# Patient Record
Sex: Female | Born: 1990 | Race: White | Hispanic: No | Marital: Single | Smoking: Current every day smoker
Health system: Southern US, Community
[De-identification: ages and names within clinical notes are randomized; demographics above are authoritative.]

## PROBLEM LIST (undated history)

## (undated) ENCOUNTER — Inpatient Hospital Stay (HOSPITAL_COMMUNITY): Payer: Self-pay

## (undated) DIAGNOSIS — B009 Herpesviral infection, unspecified: Secondary | ICD-10-CM

## (undated) DIAGNOSIS — J45909 Unspecified asthma, uncomplicated: Secondary | ICD-10-CM

## (undated) DIAGNOSIS — R51 Headache: Secondary | ICD-10-CM

## (undated) DIAGNOSIS — F32A Depression, unspecified: Secondary | ICD-10-CM

## (undated) DIAGNOSIS — M549 Dorsalgia, unspecified: Secondary | ICD-10-CM

## (undated) DIAGNOSIS — Z9889 Other specified postprocedural states: Secondary | ICD-10-CM

## (undated) DIAGNOSIS — F1910 Other psychoactive substance abuse, uncomplicated: Secondary | ICD-10-CM

## (undated) DIAGNOSIS — T8859XA Other complications of anesthesia, initial encounter: Secondary | ICD-10-CM

## (undated) DIAGNOSIS — F419 Anxiety disorder, unspecified: Secondary | ICD-10-CM

## (undated) DIAGNOSIS — T4145XA Adverse effect of unspecified anesthetic, initial encounter: Secondary | ICD-10-CM

## (undated) DIAGNOSIS — R112 Nausea with vomiting, unspecified: Secondary | ICD-10-CM

## (undated) DIAGNOSIS — F329 Major depressive disorder, single episode, unspecified: Secondary | ICD-10-CM

## (undated) DIAGNOSIS — K759 Inflammatory liver disease, unspecified: Secondary | ICD-10-CM

## (undated) HISTORY — PX: TONSILLECTOMY: SUR1361

## (undated) HISTORY — DX: Dorsalgia, unspecified: M54.9

---
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
## 2010-04-18 ENCOUNTER — Emergency Department (HOSPITAL_COMMUNITY)
Admission: EM | Admit: 2010-04-18 | Discharge: 2010-04-18 | Payer: Self-pay | Source: Home / Self Care | Admitting: Emergency Medicine

## 2010-09-03 LAB — URINALYSIS, ROUTINE W REFLEX MICROSCOPIC
Bilirubin Urine: NEGATIVE
Glucose, UA: NEGATIVE mg/dL
Ketones, ur: NEGATIVE mg/dL
Leukocytes, UA: NEGATIVE
Nitrite: NEGATIVE
Protein, ur: NEGATIVE mg/dL
Specific Gravity, Urine: 1.016 (ref 1.005–1.030)
Urobilinogen, UA: 0.2 mg/dL (ref 0.0–1.0)
pH: 7 (ref 5.0–8.0)

## 2010-09-03 LAB — URINE MICROSCOPIC-ADD ON

## 2010-09-03 LAB — CBC
Hemoglobin: 14.7 g/dL (ref 12.0–15.0)
Platelets: 216 10*3/uL (ref 150–400)
RBC: 4.53 MIL/uL (ref 3.87–5.11)
WBC: 9.7 10*3/uL (ref 4.0–10.5)

## 2010-09-03 LAB — POCT PREGNANCY, URINE: Preg Test, Ur: POSITIVE

## 2010-09-03 LAB — URINE CULTURE
Colony Count: NO GROWTH
Culture  Setup Time: 201110281900
Culture: NO GROWTH

## 2010-09-03 LAB — DIFFERENTIAL
Basophils Absolute: 0.1 10*3/uL (ref 0.0–0.1)
Basophils Relative: 1 % (ref 0–1)
Eosinophils Absolute: 0.3 10*3/uL (ref 0.0–0.7)
Eosinophils Relative: 3 % (ref 0–5)
Lymphocytes Relative: 24 % (ref 12–46)
Lymphs Abs: 2.3 10*3/uL (ref 0.7–4.0)
Monocytes Absolute: 0.6 10*3/uL (ref 0.1–1.0)
Monocytes Relative: 6 % (ref 3–12)
Neutro Abs: 6.4 10*3/uL (ref 1.7–7.7)
Neutrophils Relative %: 66 % (ref 43–77)

## 2010-09-03 LAB — WET PREP, GENITAL: Trich, Wet Prep: NONE SEEN

## 2010-09-03 LAB — GC/CHLAMYDIA PROBE AMP, GENITAL
Chlamydia, DNA Probe: NEGATIVE
GC Probe Amp, Genital: NEGATIVE

## 2010-09-03 LAB — ABO/RH: ABO/RH(D): O POS

## 2010-09-03 LAB — HCG, QUANTITATIVE, PREGNANCY: hCG, Beta Chain, Quant, S: 3809 m[IU]/mL — ABNORMAL HIGH (ref <5)

## 2010-09-29 LAB — WET PREP, GENITAL: Yeast Wet Prep HPF POC: NONE SEEN

## 2010-09-29 LAB — URINALYSIS, ROUTINE W REFLEX MICROSCOPIC
Bilirubin Urine: NEGATIVE
Ketones, ur: NEGATIVE mg/dL
Nitrite: NEGATIVE
Protein, ur: NEGATIVE mg/dL
pH: 6 (ref 5.0–8.0)

## 2010-09-29 LAB — GC/CHLAMYDIA PROBE AMP, GENITAL: Chlamydia, DNA Probe: NEGATIVE

## 2010-09-29 LAB — URINE MICROSCOPIC-ADD ON

## 2010-09-29 LAB — PREGNANCY, URINE: Preg Test, Ur: NEGATIVE

## 2010-09-29 LAB — RPR: RPR Ser Ql: NONREACTIVE

## 2010-11-03 LAB — ANTIBODY SCREEN: Antibody Screen: NEGATIVE

## 2010-11-03 LAB — HIV ANTIBODY (ROUTINE TESTING W REFLEX): HIV: NONREACTIVE

## 2010-11-03 LAB — ABO/RH: RH Type: POSITIVE

## 2010-11-03 LAB — GC/CHLAMYDIA PROBE AMP, GENITAL: Chlamydia: NEGATIVE

## 2010-11-03 LAB — CULTURE, BETA STREP (GROUP B ONLY): Organism ID, Bacteria: POSITIVE

## 2010-11-03 LAB — RPR: RPR: NONREACTIVE

## 2010-11-03 LAB — HEPATITIS B SURFACE ANTIGEN: Hepatitis B Surface Ag: NEGATIVE

## 2011-05-31 ENCOUNTER — Encounter (HOSPITAL_COMMUNITY): Payer: Self-pay

## 2011-05-31 ENCOUNTER — Inpatient Hospital Stay (HOSPITAL_COMMUNITY)
Admission: AD | Admit: 2011-05-31 | Discharge: 2011-06-02 | DRG: 766 | Disposition: A | Discharge status: Home / Self Care | Payer: Managed Care, Other (non HMO) | Source: Ambulatory Visit | Attending: Obstetrics and Gynecology | Admitting: Obstetrics and Gynecology

## 2011-05-31 ENCOUNTER — Encounter (HOSPITAL_COMMUNITY): Payer: Self-pay | Admitting: *Deleted

## 2011-05-31 ENCOUNTER — Inpatient Hospital Stay (HOSPITAL_COMMUNITY): Payer: Managed Care, Other (non HMO)

## 2011-05-31 ENCOUNTER — Other Ambulatory Visit: Payer: Self-pay | Admitting: Obstetrics and Gynecology

## 2011-05-31 ENCOUNTER — Encounter (HOSPITAL_COMMUNITY)
Admission: AD | Disposition: A | Discharge status: Home / Self Care | Payer: Self-pay | Source: Ambulatory Visit | Attending: Obstetrics and Gynecology

## 2011-05-31 DIAGNOSIS — A6000 Herpesviral infection of urogenital system, unspecified: Secondary | ICD-10-CM | POA: Diagnosis present

## 2011-05-31 DIAGNOSIS — O2643 Herpes gestationis, third trimester: Secondary | ICD-10-CM | POA: Diagnosis present

## 2011-05-31 DIAGNOSIS — Z22330 Carrier of Group B streptococcus: Secondary | ICD-10-CM

## 2011-05-31 DIAGNOSIS — O98519 Other viral diseases complicating pregnancy, unspecified trimester: Principal | ICD-10-CM | POA: Diagnosis present

## 2011-05-31 DIAGNOSIS — O99892 Other specified diseases and conditions complicating childbirth: Secondary | ICD-10-CM | POA: Diagnosis present

## 2011-05-31 DIAGNOSIS — IMO0002 Reserved for concepts with insufficient information to code with codable children: Secondary | ICD-10-CM

## 2011-05-31 DIAGNOSIS — Z98891 History of uterine scar from previous surgery: Secondary | ICD-10-CM

## 2011-05-31 DIAGNOSIS — A6009 Herpesviral infection of other urogenital tract: Secondary | ICD-10-CM

## 2011-05-31 HISTORY — DX: Anxiety disorder, unspecified: F41.9

## 2011-05-31 HISTORY — DX: Herpesviral infection, unspecified: B00.9

## 2011-05-31 HISTORY — DX: Headache: R51

## 2011-05-31 HISTORY — DX: Depression, unspecified: F32.A

## 2011-05-31 HISTORY — DX: Major depressive disorder, single episode, unspecified: F32.9

## 2011-05-31 LAB — WET PREP, GENITAL
Clue Cells Wet Prep HPF POC: NONE SEEN
Trich, Wet Prep: NONE SEEN

## 2011-05-31 LAB — CBC
Hemoglobin: 13.7 g/dL (ref 12.0–15.0)
RBC: 4.12 MIL/uL (ref 3.87–5.11)

## 2011-05-31 LAB — RPR: RPR Ser Ql: NONREACTIVE

## 2011-05-31 SURGERY — Surgical Case
Anesthesia: Regional | Site: Abdomen | Wound class: Clean Contaminated

## 2011-05-31 MED ORDER — IBUPROFEN 600 MG PO TABS
600.0000 mg | ORAL_TABLET | Freq: Four times a day (QID) | ORAL | Status: DC | PRN
  Filled 2011-05-31 (×4): qty 1

## 2011-05-31 MED ORDER — SENNOSIDES-DOCUSATE SODIUM 8.6-50 MG PO TABS
2.0000 | ORAL_TABLET | Freq: Every day | ORAL | Status: DC
  Administered 2011-05-31 – 2011-06-01 (×2): 2 via ORAL

## 2011-05-31 MED ORDER — BISACODYL 10 MG RE SUPP: 10.0000 mg | Freq: Every day | RECTAL | Status: DC | PRN

## 2011-05-31 MED ORDER — CITRIC ACID-SODIUM CITRATE 334-500 MG/5ML PO SOLN
ORAL | Status: AC
  Administered 2011-05-31: 30 mL via ORAL
  Filled 2011-05-31: qty 15

## 2011-05-31 MED ORDER — MENTHOL 3 MG MT LOZG: 1.0000 | LOZENGE | OROMUCOSAL | Status: DC | PRN

## 2011-05-31 MED ORDER — SCOPOLAMINE 1 MG/3DAYS TD PT72
MEDICATED_PATCH | TRANSDERMAL | Status: AC
  Filled 2011-05-31: qty 1

## 2011-05-31 MED ORDER — KETOROLAC TROMETHAMINE 60 MG/2ML IM SOLN
60.0000 mg | Freq: Once | INTRAMUSCULAR | Status: AC | PRN
  Administered 2011-05-31: 60 mg via INTRAMUSCULAR

## 2011-05-31 MED ORDER — FERROUS SULFATE 325 (65 FE) MG PO TABS
325.0000 mg | ORAL_TABLET | Freq: Two times a day (BID) | ORAL | Status: DC
  Administered 2011-06-01 – 2011-06-02 (×3): 325 mg via ORAL
  Filled 2011-05-31 (×3): qty 1

## 2011-05-31 MED ORDER — KETOROLAC TROMETHAMINE 30 MG/ML IJ SOLN: 30.0000 mg | Freq: Four times a day (QID) | INTRAMUSCULAR | Status: AC | PRN

## 2011-05-31 MED ORDER — OXYTOCIN 20 UNITS IN LACTATED RINGERS INFUSION - SIMPLE
INTRAVENOUS | Status: AC
  Filled 2011-05-31: qty 1000

## 2011-05-31 MED ORDER — FLEET ENEMA 7-19 GM/118ML RE ENEM: 1.0000 | ENEMA | Freq: Every day | RECTAL | Status: DC | PRN

## 2011-05-31 MED ORDER — DIBUCAINE 1 % RE OINT: 1.0000 "application " | TOPICAL_OINTMENT | RECTAL | Status: DC | PRN

## 2011-05-31 MED ORDER — MORPHINE SULFATE (PF) 0.5 MG/ML IJ SOLN
INTRAMUSCULAR | Status: DC | PRN
  Administered 2011-05-31 (×2): .5 mg via INTRAVENOUS
  Administered 2011-05-31: .15 mg via INTRATHECAL
  Administered 2011-05-31: .85 mg via INTRAVENOUS

## 2011-05-31 MED ORDER — ONDANSETRON HCL 4 MG/2ML IJ SOLN
INTRAMUSCULAR | Status: DC | PRN
  Administered 2011-05-31: 4 mg via INTRAVENOUS

## 2011-05-31 MED ORDER — WITCH HAZEL-GLYCERIN EX PADS: 1.0000 "application " | MEDICATED_PAD | CUTANEOUS | Status: DC | PRN

## 2011-05-31 MED ORDER — ONDANSETRON HCL 4 MG/2ML IJ SOLN: 4.0000 mg | INTRAMUSCULAR | Status: DC | PRN

## 2011-05-31 MED ORDER — LANOLIN HYDROUS EX OINT: 1.0000 "application " | TOPICAL_OINTMENT | CUTANEOUS | Status: DC | PRN

## 2011-05-31 MED ORDER — GLYCOPYRROLATE 0.2 MG/ML IJ SOLN
INTRAMUSCULAR | Status: DC | PRN
  Administered 2011-05-31: 0.1 mg via INTRAVENOUS

## 2011-05-31 MED ORDER — DIPHENHYDRAMINE HCL 25 MG PO CAPS: 25.0000 mg | ORAL_CAPSULE | Freq: Four times a day (QID) | ORAL | Status: DC | PRN

## 2011-05-31 MED ORDER — NALBUPHINE SYRINGE 5 MG/0.5 ML
5.0000 mg | INJECTION | INTRAMUSCULAR | Status: DC | PRN
  Filled 2011-05-31: qty 1

## 2011-05-31 MED ORDER — CITRIC ACID-SODIUM CITRATE 334-500 MG/5ML PO SOLN
30.0000 mL | Freq: Once | ORAL | Status: AC
  Administered 2011-05-31: 30 mL via ORAL

## 2011-05-31 MED ORDER — KETOROLAC TROMETHAMINE 60 MG/2ML IM SOLN
INTRAMUSCULAR | Status: AC
  Filled 2011-05-31: qty 2

## 2011-05-31 MED ORDER — OXYTOCIN 20 UNITS IN LACTATED RINGERS INFUSION - SIMPLE
125.0000 mL/h | INTRAVENOUS | Status: AC
  Administered 2011-05-31: 125 mL/h via INTRAVENOUS

## 2011-05-31 MED ORDER — IBUPROFEN 600 MG PO TABS
600.0000 mg | ORAL_TABLET | Freq: Four times a day (QID) | ORAL | Status: DC
  Administered 2011-05-31 – 2011-06-02 (×7): 600 mg via ORAL
  Filled 2011-05-31 (×3): qty 1

## 2011-05-31 MED ORDER — CEFAZOLIN SODIUM-DEXTROSE 1-4 GM-% IV SOLR
1.0000 g | Freq: Once | INTRAVENOUS | Status: AC
  Administered 2011-05-31: 1 g via INTRAVENOUS
  Filled 2011-05-31 (×2): qty 50

## 2011-05-31 MED ORDER — ONDANSETRON HCL 4 MG PO TABS: 4.0000 mg | ORAL_TABLET | ORAL | Status: DC | PRN

## 2011-05-31 MED ORDER — LACTATED RINGERS IV SOLN
INTRAVENOUS | Status: DC
  Administered 2011-05-31 (×3): via INTRAVENOUS

## 2011-05-31 MED ORDER — DIPHENHYDRAMINE HCL 50 MG/ML IJ SOLN: 25.0000 mg | INTRAMUSCULAR | Status: DC | PRN

## 2011-05-31 MED ORDER — FENTANYL CITRATE 0.05 MG/ML IJ SOLN
INTRAMUSCULAR | Status: DC | PRN
  Administered 2011-05-31: 25 ug via INTRATHECAL
  Administered 2011-05-31 (×3): 25 ug via INTRAVENOUS

## 2011-05-31 MED ORDER — ZOLPIDEM TARTRATE 5 MG PO TABS: 5.0000 mg | ORAL_TABLET | Freq: Every evening | ORAL | Status: DC | PRN

## 2011-05-31 MED ORDER — METOCLOPRAMIDE HCL 5 MG/ML IJ SOLN: 10.0000 mg | Freq: Three times a day (TID) | INTRAMUSCULAR | Status: DC | PRN

## 2011-05-31 MED ORDER — NALOXONE HCL 0.4 MG/ML IJ SOLN: 0.4000 mg | INTRAMUSCULAR | Status: DC | PRN

## 2011-05-31 MED ORDER — LACTATED RINGERS IV SOLN: INTRAVENOUS | Status: DC

## 2011-05-31 MED ORDER — OXYTOCIN 10 UNIT/ML IJ SOLN
INTRAMUSCULAR | Status: DC | PRN
  Administered 2011-05-31: 20 [IU] via INTRAMUSCULAR

## 2011-05-31 MED ORDER — ONDANSETRON HCL 4 MG/2ML IJ SOLN: 4.0000 mg | Freq: Three times a day (TID) | INTRAMUSCULAR | Status: DC | PRN

## 2011-05-31 MED ORDER — SODIUM CHLORIDE 0.9 % IJ SOLN: 3.0000 mL | INTRAMUSCULAR | Status: DC | PRN

## 2011-05-31 MED ORDER — PRENATAL PLUS 27-1 MG PO TABS
1.0000 | ORAL_TABLET | Freq: Every day | ORAL | Status: DC
  Administered 2011-06-01 – 2011-06-02 (×2): 1 via ORAL
  Filled 2011-05-31 (×2): qty 1

## 2011-05-31 MED ORDER — MEPERIDINE HCL 25 MG/ML IJ SOLN: 6.2500 mg | INTRAMUSCULAR | Status: DC | PRN

## 2011-05-31 MED ORDER — SODIUM CHLORIDE 0.9 % IV SOLN
1.0000 ug/kg/h | INTRAVENOUS | Status: DC | PRN
  Filled 2011-05-31: qty 2.5

## 2011-05-31 MED ORDER — SCOPOLAMINE 1 MG/3DAYS TD PT72
1.0000 | MEDICATED_PATCH | Freq: Once | TRANSDERMAL | Status: DC
  Administered 2011-05-31: 1.5 mg via TRANSDERMAL

## 2011-05-31 MED ORDER — OXYCODONE-ACETAMINOPHEN 5-325 MG PO TABS
1.0000 | ORAL_TABLET | ORAL | Status: DC | PRN
  Administered 2011-05-31 – 2011-06-01 (×7): 1 via ORAL
  Administered 2011-06-02 (×2): 2 via ORAL
  Filled 2011-05-31: qty 1
  Filled 2011-05-31 (×2): qty 2
  Filled 2011-05-31 (×6): qty 1

## 2011-05-31 MED ORDER — SIMETHICONE 80 MG PO CHEW: 80.0000 mg | CHEWABLE_TABLET | ORAL | Status: DC | PRN

## 2011-05-31 MED ORDER — HYDROMORPHONE HCL PF 1 MG/ML IJ SOLN: 0.2500 mg | INTRAMUSCULAR | Status: DC | PRN

## 2011-05-31 MED ORDER — VALACYCLOVIR HCL 500 MG PO TABS
500.0000 mg | ORAL_TABLET | Freq: Every day | ORAL | Status: DC
  Administered 2011-06-01 – 2011-06-02 (×2): 500 mg via ORAL
  Filled 2011-05-31 (×3): qty 1

## 2011-05-31 MED ORDER — SIMETHICONE 80 MG PO CHEW
80.0000 mg | CHEWABLE_TABLET | Freq: Three times a day (TID) | ORAL | Status: DC
  Administered 2011-05-31 – 2011-06-01 (×5): 80 mg via ORAL

## 2011-05-31 MED ORDER — DIPHENHYDRAMINE HCL 25 MG PO CAPS: 25.0000 mg | ORAL_CAPSULE | ORAL | Status: DC | PRN

## 2011-05-31 MED ORDER — DIPHENHYDRAMINE HCL 50 MG/ML IJ SOLN: 12.5000 mg | INTRAMUSCULAR | Status: DC | PRN

## 2011-05-31 MED ORDER — TETANUS-DIPHTH-ACELL PERTUSSIS 5-2.5-18.5 LF-MCG/0.5 IM SUSP: 0.5000 mL | Freq: Once | INTRAMUSCULAR | Status: DC

## 2011-05-31 MED ORDER — EPHEDRINE SULFATE 50 MG/ML IJ SOLN
INTRAMUSCULAR | Status: DC | PRN
  Administered 2011-05-31 (×2): 20 mg via INTRAVENOUS
  Administered 2011-05-31: 10 mg via INTRAVENOUS

## 2011-05-31 SURGICAL SUPPLY — 38 items
BENZOIN TINCTURE PRP APPL 2/3 (GAUZE/BANDAGES/DRESSINGS) ×2 IMPLANT
CLOSURE STERI STRIP 1/2 X4 (GAUZE/BANDAGES/DRESSINGS) ×2 IMPLANT
CLOTH BEACON ORANGE TIMEOUT ST (SAFETY) ×2 IMPLANT
CONTAINER PREFILL 10% NBF 15ML (MISCELLANEOUS) IMPLANT
DRAIN JACKSON PRT FLT 10 (DRAIN) IMPLANT
DRESSING TELFA 8X3 (GAUZE/BANDAGES/DRESSINGS) ×2 IMPLANT
DRSG COVADERM 4X10 (GAUZE/BANDAGES/DRESSINGS) ×2 IMPLANT
DRSG PAD ABDOMINAL 8X10 ST (GAUZE/BANDAGES/DRESSINGS) ×2 IMPLANT
ELECT REM PT RETURN 9FT ADLT (ELECTROSURGICAL) ×2
ELECTRODE REM PT RTRN 9FT ADLT (ELECTROSURGICAL) ×1 IMPLANT
EVACUATOR SILICONE 100CC (DRAIN) IMPLANT
EXTRACTOR VACUUM M CUP 4 TUBE (SUCTIONS) IMPLANT
GLOVE BIO SURGEON STRL SZ 6.5 (GLOVE) ×2 IMPLANT
GLOVE BIOGEL PI IND STRL 7.0 (GLOVE) ×3 IMPLANT
GLOVE BIOGEL PI INDICATOR 7.0 (GLOVE) ×3
GLOVE SKINSENSE NS SZ6.5 (GLOVE) ×2
GLOVE SKINSENSE STRL SZ6.5 (GLOVE) ×2 IMPLANT
GOWN PREVENTION PLUS LG XLONG (DISPOSABLE) ×6 IMPLANT
KIT ABG SYR 3ML LUER SLIP (SYRINGE) IMPLANT
NEEDLE HYPO 25X5/8 SAFETYGLIDE (NEEDLE) IMPLANT
NS IRRIG 1000ML POUR BTL (IV SOLUTION) ×2 IMPLANT
PACK C SECTION WH (CUSTOM PROCEDURE TRAY) ×2 IMPLANT
RTRCTR C-SECT PINK 25CM LRG (MISCELLANEOUS) IMPLANT
SLEEVE SCD COMPRESS KNEE MED (MISCELLANEOUS) ×2 IMPLANT
STAPLER VISISTAT 35W (STAPLE) IMPLANT
STRIP CLOSURE SKIN 1/2X4 (GAUZE/BANDAGES/DRESSINGS) ×2 IMPLANT
SUT CHROMIC 0 CT 1 (SUTURE) ×2 IMPLANT
SUT MNCRL AB 3-0 PS2 27 (SUTURE) ×2 IMPLANT
SUT PLAIN 2 0 (SUTURE)
SUT PLAIN 2 0 XLH (SUTURE) ×2 IMPLANT
SUT PLAIN ABS 2-0 CT1 27XMFL (SUTURE) IMPLANT
SUT SILK 2 0 SH (SUTURE) IMPLANT
SUT VIC AB 0 CTX 36 (SUTURE) ×4
SUT VIC AB 0 CTX36XBRD ANBCTRL (SUTURE) ×4 IMPLANT
TAPE CLOTH SURG 4X10 WHT LF (GAUZE/BANDAGES/DRESSINGS) ×2 IMPLANT
TOWEL OR 17X24 6PK STRL BLUE (TOWEL DISPOSABLE) ×4 IMPLANT
TRAY FOLEY CATH 14FR (SET/KITS/TRAYS/PACK) ×2 IMPLANT
WATER STERILE IRR 1000ML POUR (IV SOLUTION) ×2 IMPLANT

## 2011-05-31 NOTE — Anesthesia Preprocedure Evaluation (Signed)
Anesthesia Evaluation  Patient identified by MRN, date of birth, ID band Patient awake    Reviewed: Allergy & Precautions, H&P , Patient's Chart, lab work & pertinent test results  Airway Mallampati: II TM Distance: >3 FB Neck ROM: full    Dental No notable dental hx.    Pulmonary  clear to auscultation  Pulmonary exam normal       Cardiovascular Exercise Tolerance: Good regular Normal    Neuro/Psych    GI/Hepatic   Endo/Other    Renal/GU      Musculoskeletal   Abdominal   Peds  Hematology   Anesthesia Other Findings   Reproductive/Obstetrics                           Anesthesia Physical Anesthesia Plan  ASA: II  Anesthesia Plan: Spinal   Post-op Pain Management:    Induction:   Airway Management Planned:   Additional Equipment:   Intra-op Plan:   Post-operative Plan:   Informed Consent: I have reviewed the patients History and Physical, chart, labs and discussed the procedure including the risks, benefits and alternatives for the proposed anesthesia with the patient or authorized representative who has indicated his/her understanding and acceptance.   Dental Advisory Given  Plan Discussed with: CRNA  Anesthesia Plan Comments: (Lab work confirmed with CRNA in room. Platelets okay. Discussed spinal anesthetic, and patient consents to the procedure:  included risk of possible headache,backache, failed block, allergic reaction, and nerve injury. This patient was asked if she had any questions or concerns before the procedure started. )        Anesthesia Quick Evaluation  

## 2011-05-31 NOTE — Progress Notes (Signed)
Amy Pham   Subjective: Pt complain of pain secondary to contractions  Objective: BP 121/74  Pulse 91  Temp(Src) 98.4 F (36.9 C) (Oral)  Resp 18  Ht 5\' 7"  (1.702 m)  Wt 82.555 kg (182 lb)  BMI 28.51 kg/m2  SpO2 99%      FHT:  FHR: 130 bpm, variability: minimal ,  accelerations:  Present,  decelerations:  Absent UC:   regular, every 2-5 minutes SVE:   Dilation: 1.5 Effacement (%): 90 Station: -2 Exam by:: Conni Elliot CNM  Labs: Lab Results  Component Value Date   WBC 9.7 04/18/2010   HGB 14.1 11/03/2010   HCT 40 11/03/2010   MCV 92.7 04/18/2010   PLT 190 11/03/2010    Assessment / Plan: recent HSV outbreak with SROM  Labor: Progressing normally Fetal Wellbeing:  Category I Anticipated MOD:  pt offered cesarean secondary to recent HSV infection. risks  are bleeding, infection and damage to bowel bladder and major blood vessels.  Pt agreed to cesarean.  Beyla Loney A 05/31/2011, 8:17 AM

## 2011-05-31 NOTE — Consult Note (Signed)
Called to attend primary C/S for active HSV at 40 + weeks gestational age. Membranes intact by report. At delivery infant in vertex and was assisted vacuum extraction of head.  No dysmorphic features. Voided x 1 under radiant warmer. Cough/sneeze repeatedly with coarse breath sounds - suspect small mucus plug in trachea causing the cough/sneeze.  Given chest PT with improvement. BBO2 x 3 minutes. Shown to parents then carried to Transitional Nursery in warm blanket  Care to assigned pediatrician.    Dagoberto Ligas MD [facility]

## 2011-05-31 NOTE — ED Provider Notes (Deleted)
History     Chief Complaint  Patient presents with  . Contractions   HPI  Pt presents with c/o contractions with increased frequency and intensity since 0100 05/31/11.  Also has noted sm amt of pink spotting at times since approx 0300 05/31/11.  Also reports small amts of clear watery vaginal discharge since yesterday.  Denies fever, vag itching, burning or odor.  Reports her fetus is moving normally.  Pt reports she did have an HSV outbreak on her right labia last week which has now healed.   She reports she continues to smoke 1/2 ppd but denies current drug or ETOH use. Did not start Zoloft as rxed for depression/anxiety and states she is OK without med at present.   OB History    Grav Para Term Preterm Abortions TAB SAB Ect Mult Living   4    3 1 2    0      Past Medical History  Diagnosis Date  . HSV (herpes simplex virus) infection     not to be discussed in front of family per patient  . Anxiety   . Depression   . Headache     Past Surgical History  Procedure Date  . Tonsillectomy     2007    Family History  Problem Relation Age of Onset  . Diabetes Maternal Uncle   . Arthritis Maternal Grandmother   . Cancer Maternal Grandmother   . Heart disease Paternal Grandfather     History  Substance Use Topics  . Smoking status: Current Everyday Smoker -- 0.5 packs/day  . Smokeless tobacco: Not on file  . Alcohol Use: No     hx of drug use and rehab x2    Allergies: No Known Allergies  Prescriptions prior to admission  Medication Sig Dispense Refill  . prenatal vitamin w/FE, FA (PRENATAL 1 + 1) 27-1 MG TABS Take 1 tablet by mouth daily.        . valACYclovir (VALTREX) 500 MG tablet Take 500 mg by mouth daily.          Review of Systems  Constitutional: Negative.   HENT: Negative.   Eyes: Negative.   Respiratory: Negative.   Cardiovascular: Negative.   Gastrointestinal: Negative.   Genitourinary: Negative.   Musculoskeletal: Positive for back pain.  Skin:  Negative.   Neurological: Negative.   Endo/Heme/Allergies: Negative.   Psychiatric/Behavioral: Negative.    Physical Exam   Blood pressure 121/74, pulse 91, temperature 98.4 F (36.9 C), temperature source Oral, resp. rate 18, height 5\' 7"  (1.702 m), weight 82.555 kg (182 lb), SpO2 99.00%.  Physical Exam  Constitutional: She is oriented to person, place, and time. She appears well-developed and well-nourished.  HENT:  Head: Normocephalic and atraumatic.  Right Ear: External ear normal.  Left Ear: External ear normal.  Nose: Nose normal.  Eyes: Conjunctivae are normal. Pupils are equal, round, and reactive to light.  Neck: Normal range of motion. Neck supple. No thyromegaly present.  Cardiovascular: Normal rate, regular rhythm and intact distal pulses.   Respiratory: Effort normal and breath sounds normal.  GI: Soft. Bowel sounds are normal.       Gravid  Genitourinary: Vaginal discharge found.       Ext genitalia without open lesions noted but healing and sl redness noted at area where pt indicates she had HSV outbreak.  BUS neg.  Vag mod amt thin white d/c in vault with pos pool.  Neg CMT.    Musculoskeletal: Normal range of  motion.  Neurological: She is alert and oriented to person, place, and time.  Skin: Skin is warm and dry.  Psychiatric: She has a normal mood and affect. Her behavior is normal. Thought content normal.   FHR baseline 140 bpm.  Moderate variability present.  Accels present.  No decels noted. UCs every 5-6 minutes and mild to mod to palpation with 60-70sec duration.  Pt talking through UCs. SVE:  Dilation:  1-2 Effacement:  90% Station:  -2 Soft/posterior Exam by N. Katrinka Blazing, CNM  MAU Course  Procedures  Wet prep-neg Amnisure-positive Fern-positive  Assessment and Plan  IUP at 40.3wks Contractions Hx HSV with recent outbreak Pos GBS (urine in 1st trim) Tobacco use Prev hx MJ use Hx Depression/Anxiety.   Admit per consult Dr. Normand Sloop. Plan primary  C/S due to recent HSV.  Amy Pham O. 05/31/2011, 7:40 AM

## 2011-05-31 NOTE — Anesthesia Postprocedure Evaluation (Signed)
  Anesthesia Post-op Note  Patient: Amy Pham  Procedure(s) Performed:  CESAREAN SECTION - Primary cesarean section with delivery of baby boy at 60. apgars 8/9.   Patient is awake, responsive, moving her legs, and has signs of resolution of her numbness. Pain and nausea are reasonably well controlled. Vital signs are stable and clinically acceptable. Oxygen saturation is clinically acceptable. There are no apparent anesthetic complications at this time. Patient is ready for discharge.

## 2011-05-31 NOTE — Addendum Note (Signed)
Addendum  created 05/31/11 1403 by Quin Hoop Camaria Gerald   Modules edited:Notes Section

## 2011-05-31 NOTE — Progress Notes (Signed)
Patient states she has been having contractions and light pink spotting since 1am

## 2011-05-31 NOTE — ED Provider Notes (Signed)
History       Chief Complaint  Patient presents with  . Contractions   HPI Pt presents with c/o contractions with increased frequency and intensity since 0100 05/31/11.  Also has noted sm amt of pink spotting at times since approx 0300 05/31/11.  Also reports small amts of clear watery vaginal discharge since yesterday.  Denies fever, vag itching, burning or odor.  Reports her fetus is moving normally.  Pt reports she did have an HSV outbreak on her right labia last week which has now healed.   She reports she continues to smoke 1/2 ppd but denies current drug or ETOH use. Did not start Zoloft as rxed for depression/anxiety and states she is OK without med at present.   OB History    Grav Para Term Preterm Abortions TAB SAB Ect Mult Living   4    3 1 2    0      Past Medical History  Diagnosis Date  . HSV (herpes simplex virus) infection     not to be discussed in front of family per patient  . Anxiety   . Depression   . Headache     Past Surgical History  Procedure Date  . Tonsillectomy     2007    Family History  Problem Relation Age of Onset  . Diabetes Maternal Uncle   . Arthritis Maternal Grandmother   . Cancer Maternal Grandmother   . Heart disease Paternal Grandfather     History  Substance Use Topics  . Smoking status: Current Everyday Smoker -- 0.5 packs/day  . Smokeless tobacco: Not on file  . Alcohol Use: No     hx of drug use and rehab x2    Allergies: No Known Allergies  Prescriptions prior to admission  Medication Sig Dispense Refill  . prenatal vitamin w/FE, FA (PRENATAL 1 + 1) 27-1 MG TABS Take 1 tablet by mouth daily.          Review of Systems  Constitutional: Negative.   HENT: Negative.   Eyes: Negative.   Respiratory: Negative.   Cardiovascular: Negative.   Gastrointestinal: Negative.   Genitourinary: Negative.   Musculoskeletal: Positive for back pain.  Skin: Negative.   Neurological: Negative.   Endo/Heme/Allergies: Negative.     Psychiatric/Behavioral: Negative.    Physical Exam   Blood pressure 121/74, pulse 91, temperature 98.4 F (36.9 C), temperature source Oral, resp. rate 18, height 5\' 7"  (1.702 m), weight 82.555 kg (182 lb), SpO2 99.00%.  Physical Exam  Constitutional: She is oriented to person, place, and time. She appears well-developed and well-nourished.  HENT:  Head: Normocephalic and atraumatic.  Right Ear: External ear normal.  Left Ear: External ear normal.  Nose: Nose normal.  Eyes: Conjunctivae are normal. Pupils are equal, round, and reactive to light.  Neck: Normal range of motion. Neck supple. No thyromegaly present.  Cardiovascular: Normal rate, regular rhythm and intact distal pulses.   Respiratory: Effort normal and breath sounds normal.  GI: Soft. Bowel sounds are normal.       Gravid  Genitourinary: Vaginal discharge found.       Ext genitalia without open lesions noted but healing and sl redness noted at area where pt indicates she had HSV outbreak.  BUS neg.  Vag mod amt thin white d/c in vault with pos pool.  Neg CMT.    Musculoskeletal: Normal range of motion.  Neurological: She is alert and oriented to person, place, and time.  Skin: Skin is  warm and dry.  Psychiatric: She has a normal mood and affect. Her behavior is normal. Thought content normal.   FHR baseline 140 bpm.  Moderate variability present.  Accels present.  No decels noted. UCs every 5-6 minutes and mild to mod to palpation with 60-70sec duration.  Pt talking through UCs. SVE:  Dilation:  1-2 Effacement:  90% Station:  -2 Soft/posterior Exam by N. Amy Pham, CNM  MAU Course  Procedures Wet prep-neg Amnisure-positive Fern-positive  Assessment and Plan  IUP at 40.3wks Contractions Hx HSV with recent outbreak Pos GBS (urine in 1st trim) Tobacco use Prev hx MJ use Hx Depression/Anxiety.   Admit per consult Dr. Normand Pham. Plan primary C/S due to recent HSV.  Amy Pham O. 05/31/2011, 7:21 AM

## 2011-05-31 NOTE — Transfer of Care (Signed)
Immediate Anesthesia Transfer of Care Note  Patient: Amy Pham  Procedure(s) Performed:  CESAREAN SECTION - Primary cesarean section with delivery of baby boy at 58. apgars 8/9.  Patient Location: PACU  Anesthesia Type: Spinal  Level of Consciousness: awake, alert  and oriented  Airway & Oxygen Therapy: Patient Spontanous Breathing  Post-op Assessment: Report given to PACU RN and Post -op Vital signs reviewed and stable  Post vital signs: Reviewed and stable  Complications: No apparent anesthesia complications

## 2011-05-31 NOTE — ED Notes (Signed)
Called Mechele Collin MD Anesthesia received order for preoperative medication, notified NICU Dr. Oretha Milch about impending C/S and reason, notified house coverage.

## 2011-05-31 NOTE — Op Note (Signed)
Cesarean Section Procedure Note   PAVNEET MARKWOOD  05/31/2011  Indications: recent HSV infection   Pre-operative Diagnosis: ruptured/herpes simplex virus outbreak.   Post-operative Diagnosis: Same   Surgeon: Surgeon(s) and Role:    * Michael Litter, MD - Primary   Assistants: Dorene Grebe CNM  Anesthesia: spinal   Procedure Details:  The patient was seen in the Holding Room. The risks, benefits, complications, treatment options, and expected outcomes were discussed with the patient. The patient concurred with the proposed plan, giving informed consent. identified as Amy Pham and the procedure verified as C-Section Delivery. A Time Out was held and the above information confirmed.  After induction of anesthesia, the patient was draped and prepped in the usual sterile manner. A transverse incision was made and carried down through the subcutaneous tissue to the fascia. Fascial incision was made in the midline and extended transversely. The fascia was separated from the underlying rectus muscle superiorly and inferiorly. The peritoneum was identified and entered. Peritoneal incision was extended longitudinally with good visualization of bowel and bladder. The utero-vesical peritoneal reflection was incised transversely and the bladder flap was bluntly freed from the lower uterine segment. A low transverse uterine incision was made. Delivered from cephalic presentation was a  pound infant, with Apgar scores of 8 at one minute and 9 at five minutes. Cord ph was not sent the umbilical cord was clamped and cut cord blood was obtained for evaluation. The placenta was removed Intact and appeared normal. The uterine outline, tubes and ovaries appeared normal}. The uterine incision was closed with running locked sutures of 0Vicryl. A second layer 0 vicrlyl was used to imbricate the uterine incision    Hemostasis was observed. Lavage was carried out until clear. The fascia was then  reapproximated with running sutures ofo vicryl.  The superficial fascia was reaproximated with  0plain gut. The subcuticular closure of the skin was performed using 3-72monocryl     Instrument, sponge, and needle counts were correct prior the abdominal closure and were correct at the conclusion of the case.    Findings:female infant in vtx presentation with terminal meconium.     Estimated Blood Loss: 600cc  Total IV Fluids:   Urine Output: 100CC OF clear urine  Specimens: placenta to pathology  Complications: no complications  Disposition: PACU - hemodynamically stable.   Maternal Condition: stable   Baby condition / location:  nursery-stable  Attending Attestation: I was present and scrubbed for the entire procedure.   Signed: Surgeon(s): Michael Litter, MD

## 2011-05-31 NOTE — Anesthesia Postprocedure Evaluation (Signed)
  Anesthesia Post-op Note  Patient: Amy Pham  Procedure(s) Performed:  CESAREAN SECTION - Primary cesarean section with delivery of baby boy at 21. apgars 8/9.  Patient Location: Mother/Baby  Anesthesia Type: Spinal  Level of Consciousness: awake, alert  and oriented  Airway and Oxygen Therapy: Patient Spontanous Breathing  Post-op Pain: mild  Post-op Assessment: Patient's Cardiovascular Status Stable, Respiratory Function Stable, Patent Airway, No signs of Nausea or vomiting and Pain level controlled  Post-op Vital Signs: stable  Complications: No apparent anesthesia complications

## 2011-05-31 NOTE — Anesthesia Procedure Notes (Signed)

## 2011-05-31 NOTE — H&P (Signed)
Chief Complaint   Patient presents with   .  Contractions    HPI  Pt presents with c/o contractions with increased frequency and intensity since 0100 05/31/11. Also has noted sm amt of pink spotting at times since approx 0300 05/31/11. Also reports small amts of clear watery vaginal discharge since yesterday. Denies fever, vag itching, burning or odor. Reports her fetus is moving normally. Pt reports she did have an HSV outbreak on her right labia last week which has now healed. She reports she continues to smoke 1/2 ppd but denies current drug or ETOH use. Did not start Zoloft as rxed for depression/anxiety and states she is OK without med at present.  OB History    Grav  Para  Term  Preterm  Abortions  TAB  SAB  Ect  Mult  Living    4     3  1  2     0      Past Medical History   Diagnosis  Date   .  HSV (herpes simplex virus) infection      not to be discussed in front of family per patient   .  Anxiety    .  Depression    .  Headache     Past Surgical History   Procedure  Date   .  Tonsillectomy      2007    Family History   Problem  Relation  Age of Onset   .  Diabetes  Maternal Uncle    .  Arthritis  Maternal Grandmother    .  Cancer  Maternal Grandmother    .  Heart disease  Paternal Grandfather     History   Substance Use Topics   .  Smoking status:  Current Everyday Smoker -- 0.5 packs/day   .  Smokeless tobacco:  Not on file   .  Alcohol Use:  No      hx of drug use and rehab x2    Allergies: No Known Allergies  Prescriptions prior to admission   Medication  Sig  Dispense  Refill   .  prenatal vitamin w/FE, FA (PRENATAL 1 + 1) 27-1 MG TABS  Take 1 tablet by mouth daily.      Review of Systems  Constitutional: Negative.  HENT: Negative.  Eyes: Negative.  Respiratory: Negative.  Cardiovascular: Negative.  Gastrointestinal: Negative.  Genitourinary: Negative.  Musculoskeletal: Positive for back pain.  Skin: Negative.  Neurological: Negative.    Endo/Heme/Allergies: Negative.  Psychiatric/Behavioral: Negative.   Physical Exam   Blood pressure 121/74, pulse 91, temperature 98.4 F (36.9 C), temperature source Oral, resp. rate 18, height 5\' 7"  (1.702 m), weight 82.555 kg (182 lb), SpO2 99.00%.  Physical Exam  Constitutional: She is oriented to person, place, and time. She appears well-developed and well-nourished.  HENT:  Head: Normocephalic and atraumatic.  Right Ear: External ear normal.  Left Ear: External ear normal.  Nose: Nose normal.  Eyes: Conjunctivae are normal. Pupils are equal, round, and reactive to light.  Neck: Normal range of motion. Neck supple. No thyromegaly present.  Cardiovascular: Normal rate, regular rhythm and intact distal pulses.  Respiratory: Effort normal and breath sounds normal.  GI: Soft. Bowel sounds are normal.  Gravid  Genitourinary: Vaginal discharge found.  Ext genitalia without open lesions noted but healing and sl redness noted at area where pt indicates she had HSV outbreak. BUS neg. Vag mod amt thin white d/c in vault with pos pool. Neg CMT.  Musculoskeletal: Normal range of motion.  Neurological: She is alert and oriented to person, place, and time.  Skin: Skin is warm and dry.  Psychiatric: She has a normal mood and affect. Her behavior is normal. Thought content normal.  FHR baseline 140 bpm. Moderate variability present. Accels present. No decels noted.  UCs every 5-6 minutes and mild to mod to palpation with 60-70sec duration. Pt talking through UCs.  SVE: Dilation: 1-2  Effacement: 90%  Station: -2  Soft/posterior  Exam by N. Katrinka Blazing, CNM  MAU Course   Procedures  Wet prep-neg  Amnisure-positive  Fern-positive  Assessment and Plan   IUP at 40.3wks  Contractions  Hx HSV with recent outbreak  Pos GBS (urine in 1st trim)  Tobacco use  Prev hx MJ use  Hx Depression/Anxiety.  Admit per consult Dr. Normand Sloop.  Plan primary C/S due to recent HSV.  Raja Caputi O.  05/31/2011,  7:21 AM

## 2011-05-31 NOTE — OR Nursing (Signed)
Uterus massaged by S. Hannia Matchett RN.  Two tubes of cord blood to lab.  

## 2011-06-01 ENCOUNTER — Encounter (HOSPITAL_COMMUNITY): Payer: Self-pay | Admitting: *Deleted

## 2011-06-01 LAB — CBC
HCT: 33.6 % — ABNORMAL LOW (ref 36.0–46.0)
Platelets: 145 10*3/uL — ABNORMAL LOW (ref 150–400)
RDW: 14 % (ref 11.5–15.5)
WBC: 17.5 10*3/uL — ABNORMAL HIGH (ref 4.0–10.5)

## 2011-06-01 NOTE — Progress Notes (Signed)
Subjective: Postpartum Day 1: Cesarean Delivery due to HSV outbreak, in labor (confidential) Patient reports no problems voiding.  Foley removed at 1am, several voids since.  Up ad lib, no syncope or dizziness.  Bottlefeeding.   Objective: Vital signs in last 24 hours: Temp:  [97.7 F (36.5 C)-98.6 F (37 C)] 98.6 F (37 C) (12/10 0520) Pulse Rate:  [75-107] 86  (12/10 0520) Resp:  [16-34] 20  (12/10 0520) BP: (90-123)/(52-72) 123/64 mmHg (12/10 0520) SpO2:  [97 %-100 %] 97 % (12/10 0520)  Physical Exam:  General: alert Lochia: appropriate Uterine Fundus: firm Incision: healing well, dressing still in place, CDI DVT Evaluation: No evidence of DVT seen on physical exam. Negative Homan's sign.   Basename 06/01/11 0620 05/31/11 0811  HGB 11.3* 13.7  HCT 33.6* 39.7    Assessment/Plan: Status post Cesarean section. Doing well postoperatively.  Continue current care. Plan d/c home day 3 Reviewed contraceptive choices--patient undecided at present.  Amy Pham 06/01/2011, 9:45 AM

## 2011-06-02 DIAGNOSIS — O2643 Herpes gestationis, third trimester: Secondary | ICD-10-CM | POA: Diagnosis present

## 2011-06-02 DIAGNOSIS — Z98891 History of uterine scar from previous surgery: Secondary | ICD-10-CM

## 2011-06-02 MED ORDER — NORGESTIMATE-ETH ESTRADIOL 0.25-35 MG-MCG PO TABS: 1.0000 | ORAL_TABLET | Freq: Every day | ORAL | Status: DC

## 2011-06-02 MED ORDER — OXYCODONE-ACETAMINOPHEN 5-325 MG PO TABS: 1.0000 | ORAL_TABLET | ORAL | Status: AC | PRN

## 2011-06-02 MED ORDER — IBUPROFEN 600 MG PO TABS: 600.0000 mg | ORAL_TABLET | Freq: Four times a day (QID) | ORAL | Status: AC | PRN

## 2011-06-02 NOTE — Discharge Summary (Signed)
Obstetric Discharge Summary Reason for Admission: onset of labor, active HSV infection Prenatal Procedures: [place] Intrapartum Procedures: cesarean: low cervical, transverse Postpartum Procedures: none Complications-Operative and Postpartum: none Hemoglobin  Date Value Range Status  06/01/2011 11.3* 12.0-15.0 (g/dL) Final     DELTA CHECK NOTED     REPEATED TO VERIFY     HCT  Date Value Range Status  06/01/2011 33.6* 36.0-46.0 (%) Final   Hospital Course:  Patient was admitted on 05/31/11 in early labor, with evidence of current HSV outbreak.  She was taken to the operating room, where Dr. Normand Sloop performed a primary LTCS under spinal anesthesia, with delivery of a viable female, weight 7+4, apgars 8/9.  Infant was taken to the full-term nursery, mom was taken to recovery in good condition.  Patient planned to bottle feed.  On post-op day 1, patient was doing well, tolerating a regular diet, with Hgb of 11.3.  Throughout her stay, her physical exam was WNL, her incision was CDI, and her vital signs remained stable.  By post-op day 2, she was up ad lib, tolerating a regular diet, with good pain control with po med.  She desired early discharge and was discharged home in stable condition.  Contraceptive choice was oral contraceptives.    Discharge Diagnoses: Term Pregnancy-delivered, Active HSV infection, Primary LTCS  Discharge Information: Date: 06/02/2011 Activity: Per [facility] handout Diet: routine Medications: Ibuprofen, Percocet and Sprintec Condition: stable Instructions: refer to practice specific booklet Discharge to: home   Newborn Data: Live born female  Birth Weight: 7 lb 4.6 oz (3305 g) APGAR: 8, 9  Home with mother.  Amy Pham 06/02/2011, 10:50am

## 2011-06-02 NOTE — Progress Notes (Signed)
LATE ENTRY FROM 06/01/11:  PSYCHOSOCIAL ASSESSMENT ~ MATERNAL/CHILD Name: Amy Pham                                                                             Age: 20  Referral Date:       12 /10   / 12  Reason/Source: History of depression and MJ use / CN  I. FAMILY/HOME ENVIRONMENT A. Child's Legal Guardian _X__Parent(s) ___Grandparent ___Foster parent ___DSS_________________ Name: Amy Pham                         DOB: //                     Age: 21  Address: [address], [place] [address]  Name:    Amy Pham                                 DOB: //                     Age: 54  Address:  B. Other Household Members/Support Persons Name: Amy Pham                  Relationship: parents                       DOB ___/___/___                   Name:                                         Relationship:                        DOB ___/___/___                   Name:                                         Relationship:                        DOB ___/___/___                   Name:                                         Relationship:                        DOB ___/___/___  C. Other Support:   II. PSYCHOSOCIAL DATA A. Information Source  _X_Patient Interview  __Family Interview           __Other___________  B. Surveyor, quantity and [employer] __Employment: _X_Medicaid    [place]                 _X_Private Insurance: [employer]                   __Self Pay  _X_Food Stamps   _X_WIC __[employer]* will apply     __Public Housing     __Section 8    __Maternity Care Coordination/Child Service Coordination/Early Intervention   ___School:                                                                         Grade:  __Other:   Salena Saner Cultural and Environment Information Cultural Issues Impacting Care:  III. STRENGTHS _X__Supportive family/friends _X__Adequate  Resources ___Compliance with medical plan _X__Home prepared for Child (including basic supplies) ___Understanding of illness      ___Other: RISK FACTORS AND CURRENT PROBLEMS         ____No Problems Noted         History of depression/anxiety  History of MJ                                                                                                                                                                                                                                            IV. SOCIAL WORK ASSESSMENT  Sw met with pt to assess history of depression/anxiety and MJ use.  Pt told Sw that she was diagnosed with depression at age 57 and has dealt with symptoms "on and off" to present.  She has not taken any medication recently and reports that she coped well during pregnancy.  She denies any SI or HI history.  She admits to smoking MJ during pregnancy around "5th or 6th month" to help increase her appetite or at bedtime.  She last time she smoked was 2-3 months ago.  Pt also has a history of opiate abuse and received in patient treatment for 21 days at facility in [facility].  She  denies other illegal substance use.  She reports having all the supplies she needs for the infant.  Sw will follow up with drug screen and make a referral if needed.    V. SOCIAL WORK PLAN  _X__No Further Intervention Required/No Barriers to Discharge   ___Psychosocial Support and Ongoing Assessment of Needs   ___Patient/Family Education:   ___Child Protective Services Report   County___________ Date___/____/____   ___Information/Referral to [employer]_________________________   ___Other:

## 2011-06-02 NOTE — Progress Notes (Addendum)
Subjective: Postpartum Day 2; Cesarean Delivery for HSV outbreak Patient reports no problems voiding.  Doing well.  Interested in d/c today if baby can go.  Wants OCPs, bottlefeeding. Baby hasn't been seen by peds yet--having some spitting.  Objective: Vital signs in last 24 hours: Temp:  [97.6 F (36.4 C)-98.7 F (37.1 C)] 98.7 F (37.1 C) (12/11 0551) Pulse Rate:  [83-97] 96  (12/11 0551) Resp:  [18-20] 20  (12/11 0551) BP: (90-112)/(55-73) 104/70 mmHg (12/11 0551) SpO2:  [97 %] 97 % (12/10 0935)  Physical Exam:  General: alert Lochia: appropriate Uterine Fundus: firm Incision: healing well DVT Evaluation: No evidence of DVT seen on physical exam. Negative Homan's sign.   Basename 06/01/11 0620 05/31/11 0811  HGB 11.3* 13.7  HCT 33.6* 39.7    Assessment/Plan: Status post Cesarean section. Doing well postoperatively.  Continue current care. Will await peds decision regarding baby.  Nigel Bridgeman 06/02/2011, 7:41 AM  Addendum: Pecola Leisure OK'd for d/c--will d/c mom today.  Nigel Bridgeman, CNM, [place] 06/02/11 10:50am

## 2011-08-11 ENCOUNTER — Other Ambulatory Visit: Payer: Self-pay | Admitting: Obstetrics and Gynecology

## 2011-08-11 DIAGNOSIS — K8020 Calculus of gallbladder without cholecystitis without obstruction: Secondary | ICD-10-CM

## 2011-08-12 ENCOUNTER — Ambulatory Visit (HOSPITAL_COMMUNITY)
Admission: RE | Admit: 2011-08-12 | Discharge: 2011-08-12 | Disposition: A | Discharge status: Home / Self Care | Payer: Managed Care, Other (non HMO) | Source: Ambulatory Visit | Attending: Obstetrics and Gynecology | Admitting: Obstetrics and Gynecology

## 2011-08-12 ENCOUNTER — Other Ambulatory Visit: Payer: Self-pay | Admitting: Obstetrics and Gynecology

## 2011-08-12 DIAGNOSIS — K8020 Calculus of gallbladder without cholecystitis without obstruction: Secondary | ICD-10-CM

## 2011-08-12 DIAGNOSIS — R1011 Right upper quadrant pain: Secondary | ICD-10-CM | POA: Insufficient documentation

## 2011-08-17 ENCOUNTER — Other Ambulatory Visit: Payer: Self-pay | Admitting: Family Medicine

## 2011-08-17 DIAGNOSIS — M549 Dorsalgia, unspecified: Secondary | ICD-10-CM

## 2011-08-19 ENCOUNTER — Ambulatory Visit (HOSPITAL_COMMUNITY): Admission: RE | Admit: 2011-08-19 | Payer: Managed Care, Other (non HMO) | Source: Ambulatory Visit

## 2012-09-02 ENCOUNTER — Emergency Department (HOSPITAL_COMMUNITY)
Admission: EM | Admit: 2012-09-02 | Discharge: 2012-09-02 | Disposition: A | Discharge status: Home / Self Care | Payer: Managed Care, Other (non HMO) | Attending: Emergency Medicine | Admitting: Emergency Medicine

## 2012-09-02 ENCOUNTER — Encounter (HOSPITAL_COMMUNITY): Payer: Self-pay | Admitting: *Deleted

## 2012-09-02 DIAGNOSIS — Z8619 Personal history of other infectious and parasitic diseases: Secondary | ICD-10-CM | POA: Insufficient documentation

## 2012-09-02 DIAGNOSIS — N949 Unspecified condition associated with female genital organs and menstrual cycle: Secondary | ICD-10-CM | POA: Insufficient documentation

## 2012-09-02 DIAGNOSIS — N938 Other specified abnormal uterine and vaginal bleeding: Secondary | ICD-10-CM | POA: Insufficient documentation

## 2012-09-02 DIAGNOSIS — R102 Pelvic and perineal pain: Secondary | ICD-10-CM

## 2012-09-02 DIAGNOSIS — M545 Low back pain, unspecified: Secondary | ICD-10-CM | POA: Insufficient documentation

## 2012-09-02 DIAGNOSIS — M542 Cervicalgia: Secondary | ICD-10-CM | POA: Insufficient documentation

## 2012-09-02 DIAGNOSIS — R0602 Shortness of breath: Secondary | ICD-10-CM | POA: Insufficient documentation

## 2012-09-02 DIAGNOSIS — M533 Sacrococcygeal disorders, not elsewhere classified: Secondary | ICD-10-CM | POA: Insufficient documentation

## 2012-09-02 DIAGNOSIS — A599 Trichomoniasis, unspecified: Secondary | ICD-10-CM | POA: Insufficient documentation

## 2012-09-02 DIAGNOSIS — F17200 Nicotine dependence, unspecified, uncomplicated: Secondary | ICD-10-CM | POA: Insufficient documentation

## 2012-09-02 DIAGNOSIS — Z8659 Personal history of other mental and behavioral disorders: Secondary | ICD-10-CM | POA: Insufficient documentation

## 2012-09-02 DIAGNOSIS — G8929 Other chronic pain: Secondary | ICD-10-CM | POA: Insufficient documentation

## 2012-09-02 DIAGNOSIS — N898 Other specified noninflammatory disorders of vagina: Secondary | ICD-10-CM | POA: Insufficient documentation

## 2012-09-02 DIAGNOSIS — Z3202 Encounter for pregnancy test, result negative: Secondary | ICD-10-CM | POA: Insufficient documentation

## 2012-09-02 LAB — BASIC METABOLIC PANEL
BUN: 11 mg/dL (ref 6–23)
Chloride: 100 mEq/L (ref 96–112)
GFR calc Af Amer: >90 mL/min (ref >90)
Glucose, Bld: 91 mg/dL (ref 70–99)
Potassium: 3.8 mEq/L (ref 3.5–5.1)

## 2012-09-02 LAB — CBC WITH DIFFERENTIAL/PLATELET
Hemoglobin: 11.2 g/dL — ABNORMAL LOW (ref 12.0–15.0)
Lymphs Abs: 2.7 10*3/uL (ref 0.7–4.0)
Monocytes Relative: 3 % (ref 3–12)
Neutro Abs: 3.9 10*3/uL (ref 1.7–7.7)
Neutrophils Relative %: 55 % (ref 43–77)
RBC: 3.65 MIL/uL — ABNORMAL LOW (ref 3.87–5.11)
WBC: 7 10*3/uL (ref 4.0–10.5)

## 2012-09-02 LAB — URINALYSIS, ROUTINE W REFLEX MICROSCOPIC
Bilirubin Urine: NEGATIVE
Hgb urine dipstick: NEGATIVE
Nitrite: NEGATIVE
Specific Gravity, Urine: 1.02 (ref 1.005–1.030)
pH: 7 (ref 5.0–8.0)

## 2012-09-02 LAB — WET PREP, GENITAL: Yeast Wet Prep HPF POC: NONE SEEN

## 2012-09-02 LAB — PREGNANCY, URINE: Preg Test, Ur: NEGATIVE

## 2012-09-02 MED ORDER — FENTANYL CITRATE 0.05 MG/ML IJ SOLN
100.0000 ug | Freq: Once | INTRAMUSCULAR | Status: AC
  Administered 2012-09-02: 100 ug via INTRAVENOUS
  Filled 2012-09-02: qty 2

## 2012-09-02 MED ORDER — OXYCODONE-ACETAMINOPHEN 5-325 MG PO TABS: 2.0000 | ORAL_TABLET | ORAL | Status: DC | PRN

## 2012-09-02 MED ORDER — OXYCODONE-ACETAMINOPHEN 5-325 MG PO TABS: 2.0000 | ORAL_TABLET | Freq: Four times a day (QID) | ORAL | Status: DC | PRN

## 2012-09-02 MED ORDER — ONDANSETRON 8 MG PO TBDP
8.0000 mg | ORAL_TABLET | Freq: Once | ORAL | Status: AC
  Administered 2012-09-02: 8 mg via ORAL
  Filled 2012-09-02: qty 1

## 2012-09-02 MED ORDER — DOXYCYCLINE HYCLATE 100 MG PO CAPS: 100.0000 mg | ORAL_CAPSULE | Freq: Two times a day (BID) | ORAL | Status: DC

## 2012-09-02 MED ORDER — CEFTRIAXONE SODIUM 250 MG IJ SOLR
250.0000 mg | Freq: Once | INTRAMUSCULAR | Status: AC
  Administered 2012-09-02: 250 mg via INTRAMUSCULAR
  Filled 2012-09-02: qty 250

## 2012-09-02 MED ORDER — METRONIDAZOLE 500 MG PO TABS: 500.0000 mg | ORAL_TABLET | Freq: Two times a day (BID) | ORAL | Status: DC

## 2012-09-02 MED ORDER — METRONIDAZOLE 500 MG PO TABS
2000.0000 mg | ORAL_TABLET | Freq: Once | ORAL | Status: AC
  Administered 2012-09-02: 2000 mg via ORAL
  Filled 2012-09-02: qty 4

## 2012-09-02 MED ORDER — LIDOCAINE HCL (PF) 1 % IJ SOLN
INTRAMUSCULAR | Status: AC
  Administered 2012-09-02: 2.1 mL
  Filled 2012-09-02: qty 5

## 2012-09-02 MED ORDER — AZITHROMYCIN 1 G PO PACK
1.0000 g | PACK | Freq: Once | ORAL | Status: AC
  Administered 2012-09-02: 1 g via ORAL
  Filled 2012-09-02: qty 1

## 2012-09-02 NOTE — ED Provider Notes (Signed)
History  This chart was scribed for Hurman Horn, MD by Erskine Emery, ED Scribe. This patient was seen in room APAH2/APAH2 and the patient's care was started at 19:23.   CSN: [id number]  Arrival date & time 09/02/12  1520   First MD Initiated Contact with Patient 09/02/12 1923      Chief Complaint  Patient presents with  . Abdominal Pain  . Tailbone Pain    (Consider location/radiation/quality/duration/timing/severity/associated sxs/prior treatment) The history is provided by the patient. No language interpreter was used.  Amy Pham is a 22 y.o. female who presents to the Emergency Department complaining of constant moderate LLQ pain for the past 3 days. Pt called her PCP who told her it might be a cyst and that she should come to the ED for evaluation. Pt reports some associated SOB, vaginal bleeding (onset in the ED), and vaginal discharge yesterday. She reports the pain is aggravated by movement. Pt's LNMP was 2 weeks ago and she is usually very regular, about every 4 weeks. Pt reports she took a negative home pregnancy test yesterday. She denies any associated emesis, nausea, diarrhea, or any previous episodes of simlar symptoms. Pt also presents with neck pain and chronic burning lower back pain for the past year that intermittently radiates down the right leg without weak, numb, or change bowel/bladder function. Pt had a MRI ordered but she has not been able to get a ride to the facility to have it done.No sudden onset and no colicky pain. LBP is stable and unchanged in months.  Past Medical History  Diagnosis Date  . HSV (herpes simplex virus) infection     not to be discussed in front of family per patient  . Anxiety   . Depression   . Headache     Past Surgical History  Procedure Laterality Date  . Tonsillectomy      2007  . Cesarean section  05/31/2011    Procedure: CESAREAN SECTION;  Surgeon: Michael Litter, MD;  Location: [facility];  Service: Gynecology;   Laterality: N/A;  Primary cesarean section with delivery of baby boy at 53. apgars 8/9.    Family History  Problem Relation Age of Onset  . Diabetes Maternal Uncle   . Arthritis Maternal Grandmother   . Cancer Maternal Grandmother   . Heart disease Paternal Grandfather     History  Substance Use Topics  . Smoking status: Current Every Day Smoker -- 0.50 packs/day    Types: Cigarettes  . Smokeless tobacco: Not on file  . Alcohol Use: No     Comment: hx of drug use and rehab x2    OB History   Grav Para Term Preterm Abortions TAB SAB Ect Mult Living   4 1 1  3 1 2   1       Review of Systems 10 Systems reviewed and are negative for acute change except as noted in the HPI.   Allergies  Review of patient's allergies indicates no known allergies.  Home Medications   Current Outpatient Rx  Name  Route  Sig  Dispense  Refill  . doxycycline (VIBRAMYCIN) 100 MG capsule   Oral   Take 1 capsule (100 mg total) by mouth 2 (two) times daily. One po bid x 7 days   14 capsule   0   . metroNIDAZOLE (FLAGYL) 500 MG tablet   Oral   Take 1 tablet (500 mg total) by mouth 2 (two) times daily. One po bid x  7 days   14 tablet   0   . oxyCODONE-acetaminophen (PERCOCET) 5-325 MG per tablet   Oral   Take 2 tablets by mouth every 6 (six) hours as needed for pain.   10 tablet   0   . oxyCODONE-acetaminophen (PERCOCET) 5-325 MG per tablet   Oral   Take 2 tablets by mouth every 4 (four) hours as needed for pain.   6 tablet   0     Triage Vitals: BP 113/66  Pulse 96  Temp(Src) 98.7 F (37.1 C) (Oral)  Resp 18  SpO2 100%  LMP 07/22/2012  Physical Exam  Nursing note and vitals reviewed. Constitutional:  Awake, alert, nontoxic appearance.  HENT:  Head: Atraumatic.  Eyes: Pupils are equal, round, and reactive to light. Right eye exhibits no discharge. Left eye exhibits no discharge.  Neck: Neck supple.  No midline tenderness. Posterior paracervical tenderness.   Cardiovascular: Normal rate and regular rhythm.   No murmur heard. Pulmonary/Chest: Effort normal and breath sounds normal. No respiratory distress. She has no wheezes. She has no rales. She exhibits no tenderness.  Abdominal: Soft. Bowel sounds are normal. She exhibits no mass. There is tenderness. There is no rebound.  Moderately tender to LLQ. Rest of the abdomen is nontender.  Genitourinary:  Upon Speculum exam: small amount of vaginal bleeding. Cevix closed. No discharge. Bimanual exam: no cervical motion temderness, no right adnexal tenderness. Positive left adnexal tenderness without palpable mass.  Musculoskeletal: She exhibits no tenderness.       Thoracic back: She exhibits no tenderness.       Lumbar back: She exhibits no tenderness.  Bilateral lower extremities non tender without new rashes or color change, baseline ROM with intact DP pulses, CR<2 secs all digits bilaterally, DTR's symmetric and intact bilaterally KJ / AJ, motor symmetric bilateral 5 / 5 hip flexion, quadriceps, hamstrings, EHL, foot dorsiflexion, foot plantarflexion. No obvious new focal weakness. Diffuse lumbar and paralumbar tenderness. Baseline numbness to both great toes with the remainder of the feet normal light touch.  Neurological:  Mental status and motor strength appears baseline for patient and situation.  Skin: No rash noted.  Psychiatric: She has a normal mood and affect.    ED Course  Procedures (including critical care time) DIAGNOSTIC STUDIES: Oxygen Saturation is 100% on room air, normal by my interpretation.    COORDINATION OF CARE: 19:25--Patient / Family / Caregiver informed of clinical course, understand medical decision-making process, and agree with plan. Treatment plan includes: urinalysis, pregnancy test, blood work, and pelvic exam.  21:19--I performed a pelvic exam with chaperone present. Pt reports she has tried birth control, ibuprofen, and goodies with no relief from pain. I  explained to the pt that her symptoms do not suggest severe PID, but since positive for trich will cover for STDs and questionable mild PID with meds in ED and for discharge.     Labs Reviewed  WET PREP, GENITAL - Abnormal; Notable for the following:    Trich, Wet Prep FEW (*)    Clue Cells Wet Prep HPF POC FEW (*)    WBC, Wet Prep HPF POC FEW (*)    All other components within normal limits  CBC WITH DIFFERENTIAL - Abnormal; Notable for the following:    RBC 3.65 (*)    Hemoglobin 11.2 (*)    HCT 31.1 (*)    All other components within normal limits  GC/CHLAMYDIA PROBE AMP  BASIC METABOLIC PANEL  URINALYSIS, ROUTINE W REFLEX  MICROSCOPIC  PREGNANCY, URINE   No results found.   1. Pelvic pain   2. Chronic low back pain   3. Trichomonas       MDM  I doubt any other EMC precluding discharge at this time including, but not necessarily limited to the following:sepsis, cauda equina, ovarian torsion.  I personally performed the services described in this documentation, which was scribed in my presence. The recorded information has been reviewed and is accurate.     Hurman Horn, MD 09/03/12 [id number]

## 2012-09-02 NOTE — ED Notes (Signed)
Pt states pain to LLQ, PMD told her it may be a cyst. Pain x 3 days. PMD told pt she may need to come here to have a CT scan done, per pt. Pt also states, while I'm here I want to get my back checked too. Lower back pain x 1 year.

## 2012-09-05 LAB — GC/CHLAMYDIA PROBE AMP: CT Probe RNA: POSITIVE — AB

## 2012-09-07 ENCOUNTER — Telehealth (HOSPITAL_COMMUNITY): Payer: Self-pay | Admitting: Emergency Medicine

## 2012-09-07 ENCOUNTER — Ambulatory Visit (INDEPENDENT_AMBULATORY_CARE_PROVIDER_SITE_OTHER): Payer: Managed Care, Other (non HMO) | Admitting: Nurse Practitioner

## 2012-09-07 VITALS — BP 112/62 | HR 80 | Temp 97.4°F | Wt 136.0 lb

## 2012-09-07 DIAGNOSIS — R1032 Left lower quadrant pain: Secondary | ICD-10-CM

## 2012-09-07 LAB — POCT UA - MICROSCOPIC ONLY
Mucus, UA: NEGATIVE
Yeast, UA: NEGATIVE

## 2012-09-07 LAB — POCT URINALYSIS DIPSTICK

## 2012-09-07 MED ORDER — TRAMADOL HCL 50 MG PO TABS: 50.0000 mg | ORAL_TABLET | Freq: Four times a day (QID) | ORAL | Status: DC | PRN

## 2012-09-07 MED ORDER — IBUPROFEN 600 MG PO TABS: 600.0000 mg | ORAL_TABLET | Freq: Four times a day (QID) | ORAL | Status: DC | PRN

## 2012-09-07 NOTE — Progress Notes (Signed)
  Subjective:    Patient ID: Amy Pham, female    DOB: 02/20/1991, 22 y.o.   MRN: [id number]  Abdominal Pain This is a new problem. The current episode started in the past 7 days. The onset quality is sudden. The problem occurs constantly. The problem has been gradually worsening. The pain is located in the LLQ. The pain is at a severity of 9/10. The pain is severe. The quality of the pain is burning and sharp. The abdominal pain radiates to the RLQ. Associated symptoms include dysuria. Pertinent negatives include no constipation, diarrhea, fever, nausea or vomiting. Nothing aggravates the pain. The pain is relieved by nothing. She has tried nothing for the symptoms. The treatment provided no relief.  Urinary Tract Infection  This is a new problem. The current episode started in the past 7 days. The problem occurs intermittently. The problem has been gradually worsening. The quality of the pain is described as burning. The pain is at a severity of 9/10. The pain is severe. There has been no fever. She is sexually active. There is no history of pyelonephritis. Associated symptoms include flank pain. Pertinent negatives include no nausea or vomiting. She has tried nothing for the symptoms.   Patient went to ER Friday. Checked for STD was given tested positive for G/c and trich. Given 1g of azithrymycin, shot of rocephin, and  Flagyl 500 bid. Pelvic exam was negative.   Review of Systems  Constitutional: Negative.  Negative for fever.  Eyes: Negative.   Respiratory: Negative.   Gastrointestinal: Positive for abdominal pain. Negative for nausea, vomiting, diarrhea and constipation.  Genitourinary: Positive for dysuria and flank pain.       Objective:   Physical Exam  Constitutional: She is oriented to person, place, and time. She appears well-developed and well-nourished.  Cardiovascular: Normal rate, regular rhythm and normal heart sounds.   Pulmonary/Chest: Effort normal and breath  sounds normal.  Abdominal: Soft. Normal appearance and bowel sounds are normal. She exhibits no mass. There is no rebound, no guarding and no CVA tenderness.  Neurological: She is alert and oriented to person, place, and time.  Skin: Skin is warm and dry.  Psychiatric: She has a normal mood and affect. Her behavior is normal. Judgment and thought content normal.  BP 112/62  Pulse 80  Temp(Src) 97.4 F (36.3 C) (Oral)  Wt 136 lb (61.689 kg)  BMI 21.3 kg/m2  LMP 09/03/2012  Breastfeeding? No         Assessment & Plan:  Pelvic pain  Motrin as RX  Warm compresses to abdomen  Referral to GYN  Finish current antibiotic  Mary-Margaret Daphine Deutscher, FNP

## 2012-09-07 NOTE — ED Notes (Signed)
Results received from [facility].  (+) Chlamydia. Treated with Zithromax and Rocephin.  [facility] form completed and faxed.  Call and notify patient.   

## 2012-09-07 NOTE — Patient Instructions (Signed)
Pelvic Pain Pelvic pain is pain below the belly button and located between your hips. Acute pain may last a few hours or days. Chronic pelvic pain may last weeks and months. The cause may be different for different types of pain. The pain may be dull or sharp, mild or severe and can interfere with your daily activities. Write down and tell your caregiver:   Exactly where the pain is located.  If it comes and goes or is there all the time.  When it happens (with sex, urination, bowel movement, etc.)  If the pain is related to your menstrual period or stress. Your caregiver will take a full history and do a complete physical exam and Pap test. CAUSES   Painful menstrual periods (dysmenorrhea).  Normal ovulation (Mittelschmertz) that occurs in the middle of the menstrual cycle every month.  The pelvic organs get engorged with blood just before the menstrual period (pelvic congestive syndrome).  Scar tissue from an infection or past surgery (pelvic adhesions).  Cancer of the female pelvic organs. When there is pain with cancer, it has been there for a long time.  The lining of the uterus (endometrium) abnormally grows in places like the pelvis and on the pelvic organs (endometriosis).  A form of endometriosis with the lining of the uterus present inside of the muscle tissue of the uterus (adenomyosis).  Fibroid tumor (noncancerous) in the uterus.  Bladder problems such as infection, bladder spasms of the muscle tissue of the bladder.  Intestinal problems (irritable bowel syndrome, colitis, an ulcer or gastrointestinal infection).  Polyps of the cervix or uterus.  Pregnancy in the tube (ectopic pregnancy).  The opening of the cervix is too small for the menstrual blood to flow through it (cervical stenosis).  Physical or sexual abuse (past or present).  Musculo-skeletal problems from poor posture, problems with the vertebrae of the lower back or the uterine pelvic muscles falling  (prolapse).  Psychological problems such as depression or stress.  IUD (intrauterine device) in the uterus. DIAGNOSIS  Tests to make a diagnosis depends on the type, location, severity and what causes the pain to occur. Tests that may be needed include:  Blood tests.  Urine tests  Ultrasound.  X-rays.  CT Scan.  MRI.  Laparoscopy.  Major surgery. TREATMENT  Treatment will depend on the cause of the pain, which includes:  Prescription or over-the-counter pain medication.  Antibiotics.  Birth control pills.  Hormone treatment.  Nerve blocking injections.  Physical therapy.  Antidepressants.  Counseling with a psychiatrist or psychologist.  Minor or major surgery. HOME CARE INSTRUCTIONS   Only take over-the-counter or prescription medicines for pain, discomfort or fever as directed by your caregiver.  Follow your caregiver's advice to treat your pain.  Rest.  Avoid sexual intercourse if it causes the pain.  Apply warm or cold compresses (which ever works best) to the pain area.  Do relaxation exercises such as yoga or meditation.  Try acupuncture.  Avoid stressful situations.  Try group therapy.  If the pain is because of a stomach/intestinal upset, drink clear liquids, eat a bland light food diet until the symptoms go away. SEEK MEDICAL CARE IF:   You need stronger prescription pain medication.  You develop pain with sexual intercourse.  You have pain with urination.  You develop a temperature of 102 F (38.9 C) with the pain.  You are still in pain after 4 hours of taking prescription medication for the pain.  You need depression medication.    Your IUD is causing pain and you want it removed. SEEK IMMEDIATE MEDICAL CARE IF:  You develop very severe pain or tenderness.  You faint, have chills, severe weakness or dehydration.  You develop heavy vaginal bleeding or passing solid tissue.  You develop a temperature of 102 F (38.9 C)  with the pain.  You have blood in the urine.  You are being physically or sexually abused.  You have uncontrolled vomiting and diarrhea.  You are depressed and afraid of harming yourself or someone else. Document Released: 07/16/2004 Document Revised: 08/31/2011 Document Reviewed: 04/12/2008 Exit[employer] Patient Information 2013 [employer].  

## 2012-09-09 DIAGNOSIS — Z8742 Personal history of other diseases of the female genital tract: Secondary | ICD-10-CM | POA: Insufficient documentation

## 2012-09-09 DIAGNOSIS — A599 Trichomoniasis, unspecified: Secondary | ICD-10-CM | POA: Insufficient documentation

## 2012-09-09 DIAGNOSIS — A749 Chlamydial infection, unspecified: Secondary | ICD-10-CM | POA: Insufficient documentation

## 2012-09-12 ENCOUNTER — Telehealth: Payer: Self-pay | Admitting: Family Medicine

## 2012-09-12 NOTE — Telephone Encounter (Signed)
Patient aware of U/A

## 2012-09-14 MED FILL — Oxycodone w/ Acetaminophen Tab 5-325 MG: ORAL | Qty: 6 | Status: AC

## 2012-09-21 ENCOUNTER — Telehealth: Payer: Self-pay | Admitting: Nurse Practitioner

## 2012-09-21 ENCOUNTER — Encounter: Payer: Self-pay | Admitting: Nurse Practitioner

## 2012-09-23 ENCOUNTER — Other Ambulatory Visit: Payer: Self-pay | Admitting: *Deleted

## 2012-09-23 MED ORDER — VALACYCLOVIR HCL 500 MG PO TABS: ORAL_TABLET | ORAL | Status: DC

## 2012-10-26 NOTE — Telephone Encounter (Signed)
Please advise 

## 2012-10-26 NOTE — Telephone Encounter (Signed)
Was this done?

## 2012-10-26 NOTE — Telephone Encounter (Signed)
What was patient given?

## 2012-11-03 NOTE — Telephone Encounter (Signed)
error 

## 2013-01-23 LAB — OB RESULTS CONSOLE GC/CHLAMYDIA
Chlamydia: NEGATIVE
Gonorrhea: NEGATIVE

## 2013-01-23 LAB — OB RESULTS CONSOLE RPR: RPR: NONREACTIVE

## 2013-01-23 LAB — OB RESULTS CONSOLE ANTIBODY SCREEN: Antibody Screen: NEGATIVE

## 2013-01-23 LAB — OB RESULTS CONSOLE HGB/HCT, BLOOD
HCT: 40 %
Hemoglobin: 13.4 g/dL

## 2013-01-31 ENCOUNTER — Encounter: Payer: Self-pay | Admitting: *Deleted

## 2013-02-16 ENCOUNTER — Encounter: Payer: Self-pay | Admitting: Family Medicine

## 2013-02-16 ENCOUNTER — Encounter: Payer: Self-pay | Admitting: *Deleted

## 2013-02-16 ENCOUNTER — Ambulatory Visit (INDEPENDENT_AMBULATORY_CARE_PROVIDER_SITE_OTHER): Payer: Managed Care, Other (non HMO) | Admitting: Family Medicine

## 2013-02-16 VITALS — BP 97/58 | Temp 98.0°F | Wt 137.2 lb

## 2013-02-16 DIAGNOSIS — F1920 Other psychoactive substance dependence, uncomplicated: Secondary | ICD-10-CM | POA: Insufficient documentation

## 2013-02-16 DIAGNOSIS — O99340 Other mental disorders complicating pregnancy, unspecified trimester: Secondary | ICD-10-CM

## 2013-02-16 DIAGNOSIS — Z720 Tobacco use: Secondary | ICD-10-CM

## 2013-02-16 DIAGNOSIS — J4540 Moderate persistent asthma, uncomplicated: Secondary | ICD-10-CM

## 2013-02-16 DIAGNOSIS — J45909 Unspecified asthma, uncomplicated: Secondary | ICD-10-CM

## 2013-02-16 DIAGNOSIS — O34219 Maternal care for unspecified type scar from previous cesarean delivery: Secondary | ICD-10-CM

## 2013-02-16 DIAGNOSIS — O99320 Drug use complicating pregnancy, unspecified trimester: Secondary | ICD-10-CM

## 2013-02-16 DIAGNOSIS — F17200 Nicotine dependence, unspecified, uncomplicated: Secondary | ICD-10-CM

## 2013-02-16 LAB — POCT URINALYSIS DIP (DEVICE)
Bilirubin Urine: NEGATIVE
Hgb urine dipstick: NEGATIVE
Ketones, ur: NEGATIVE mg/dL
Specific Gravity, Urine: >=1.03 (ref 1.005–1.030)
pH: 5.5 (ref 5.0–8.0)

## 2013-02-16 LAB — OB RESULTS CONSOLE GBS: STREP GROUP B AG: NEGATIVE

## 2013-02-16 MED ORDER — ONDANSETRON HCL 4 MG PO TABS: 4.0000 mg | ORAL_TABLET | Freq: Three times a day (TID) | ORAL | Status: DC | PRN

## 2013-02-16 MED ORDER — BUDESONIDE-FORMOTEROL FUMARATE 80-4.5 MCG/ACT IN AERO: 2.0000 | INHALATION_SPRAY | Freq: Two times a day (BID) | RESPIRATORY_TRACT | Status: DC

## 2013-02-16 MED ORDER — ALBUTEROL SULFATE HFA 108 (90 BASE) MCG/ACT IN AERS: 2.0000 | INHALATION_SPRAY | Freq: Four times a day (QID) | RESPIRATORY_TRACT | Status: DC | PRN

## 2013-02-16 NOTE — Patient Instructions (Signed)
Pregnancy - Second Trimester The second trimester of pregnancy (3 to 6 months) is a period of rapid growth for you and your baby. At the end of the sixth month, your baby is about 9 inches long and weighs 1 1/2 pounds. You will begin to feel the baby move between 18 and 20 weeks of the pregnancy. This is called quickening. Weight gain is faster. A clear fluid (colostrum) may leak out of your breasts. You may feel small contractions of the womb (uterus). This is known as false labor or Braxton-Hicks contractions. This is like a practice for labor when the baby is ready to be born. Usually, the problems with morning sickness have usually passed by the end of your first trimester. Some women develop small dark blotches (called cholasma, mask of pregnancy) on their face that usually goes away after the baby is born. Exposure to the sun makes the blotches worse. Acne may also develop in some pregnant women and pregnant women who have acne, may find that it goes away. PRENATAL EXAMS  Blood work may continue to be done during prenatal exams. These tests are done to check on your health and the probable health of your baby. Blood work is used to follow your blood levels (hemoglobin). Anemia (low hemoglobin) is common during pregnancy. Iron and vitamins are given to help prevent this. You will also be checked for diabetes between 24 and 28 weeks of the pregnancy. Some of the previous blood tests may be repeated.  The size of the uterus is measured during each visit. This is to make sure that the baby is continuing to grow properly according to the dates of the pregnancy.  Your blood pressure is checked every prenatal visit. This is to make sure you are not getting toxemia.  Your urine is checked to make sure you do not have an infection, diabetes or protein in the urine.  Your weight is checked often to make sure gains are happening at the suggested rate. This is to ensure that both you and your baby are  growing normally.  Sometimes, an ultrasound is performed to confirm the proper growth and development of the baby. This is a test which bounces harmless sound waves off the baby so your caregiver can more accurately determine due dates. Sometimes, a test is done on the amniotic fluid surrounding the baby. This test is called an amniocentesis. The amniotic fluid is obtained by sticking a needle into the belly (abdomen). This is done to check the chromosomes in instances where there is a concern about possible genetic problems with the baby. It is also sometimes done near the end of pregnancy if an early delivery is required. In this case, it is done to help make sure the baby's lungs are mature enough for the baby to live outside of the womb. CHANGES OCCURING IN THE SECOND TRIMESTER OF PREGNANCY Your body goes through many changes during pregnancy. They vary from person to person. Talk to your caregiver about changes you notice that you are concerned about.  During the second trimester, you will likely have an increase in your appetite. It is normal to have cravings for certain foods. This varies from person to person and pregnancy to pregnancy.  Your lower abdomen will begin to bulge.  You may have to urinate more often because the uterus and baby are pressing on your bladder. It is also common to get more bladder infections during pregnancy. You can help this by drinking lots of fluids   and emptying your bladder before and after intercourse.  You may begin to get stretch marks on your hips, abdomen, and breasts. These are normal changes in the body during pregnancy. There are no exercises or medicines to take that prevent this change.  You may begin to develop swollen and bulging veins (varicose veins) in your legs. Wearing support hose, elevating your feet for 15 minutes, 3 to 4 times a day and limiting salt in your diet helps lessen the problem.  Heartburn may develop as the uterus grows and  pushes up against the stomach. Antacids recommended by your caregiver helps with this problem. Also, eating smaller meals 4 to 5 times a day helps.  Constipation can be treated with a stool softener or adding bulk to your diet. Drinking lots of fluids, and eating vegetables, fruits, and whole grains are helpful.  Exercising is also helpful. If you have been very active up until your pregnancy, most of these activities can be continued during your pregnancy. If you have been less active, it is helpful to start an exercise program such as walking.  Hemorrhoids may develop at the end of the second trimester. Warm sitz baths and hemorrhoid cream recommended by your caregiver helps hemorrhoid problems.  Backaches may develop during this time of your pregnancy. Avoid heavy lifting, wear low heal shoes, and practice good posture to help with backache problems.  Some pregnant women develop tingling and numbness of their hand and fingers because of swelling and tightening of ligaments in the wrist (carpel tunnel syndrome). This goes away after the baby is born.  As your breasts enlarge, you may have to get a bigger bra. Get a comfortable, cotton, support bra. Do not get a nursing bra until the last month of the pregnancy if you will be nursing the baby.  You may get a dark line from your belly button to the pubic area called the linea nigra.  You may develop rosy cheeks because of increase blood flow to the face.  You may develop spider looking lines of the face, neck, arms, and chest. These go away after the baby is born. HOME CARE INSTRUCTIONS   It is extremely important to avoid all smoking, herbs, alcohol, and unprescribed drugs during your pregnancy. These chemicals affect the formation and growth of the baby. Avoid these chemicals throughout the pregnancy to ensure the delivery of a healthy infant.  Most of your home care instructions are the same as suggested for the first trimester of your  pregnancy. Keep your caregiver's appointments. Follow your caregiver's instructions regarding medicine use, exercise, and diet.  During pregnancy, you are providing food for you and your baby. Continue to eat regular, well-balanced meals. Choose foods such as meat, fish, milk and other low fat dairy products, vegetables, fruits, and whole-grain breads and cereals. Your caregiver will tell you of the ideal weight gain.  A physical sexual relationship may be continued up until near the end of pregnancy if there are no other problems. Problems could include early (premature) leaking of amniotic fluid from the membranes, vaginal bleeding, abdominal pain, or other medical or pregnancy problems.  Exercise regularly if there are no restrictions. Check with your caregiver if you are unsure of the safety of some of your exercises. The greatest weight gain will occur in the last 2 trimesters of pregnancy. Exercise will help you:  Control your weight.  Get you in shape for labor and delivery.  Lose weight after you have the baby.  Wear   a good support or jogging bra for breast tenderness during pregnancy. This may help if worn during sleep. Pads or tissues may be used in the bra if you are leaking colostrum.  Do not use hot tubs, steam rooms or saunas throughout the pregnancy.  Wear your seat belt at all times when driving. This protects you and your baby if you are in an accident.  Avoid raw meat, uncooked cheese, cat litter boxes, and soil used by cats. These carry germs that can cause birth defects in the baby.  The second trimester is also a good time to visit your dentist for your dental health if this has not been done yet. Getting your teeth cleaned is okay. Use a soft toothbrush. Brush gently during pregnancy.  It is easier to leak urine during pregnancy. Tightening up and strengthening the pelvic muscles will help with this problem. Practice stopping your urination while you are going to the  bathroom. These are the same muscles you need to strengthen. It is also the muscles you would use as if you were trying to stop from passing gas. You can practice tightening these muscles up 10 times a set and repeating this about 3 times per day. Once you know what muscles to tighten up, do not perform these exercises during urination. It is more likely to contribute to an infection by backing up the urine.  Ask for help if you have financial, counseling, or nutritional needs during pregnancy. Your caregiver will be able to offer counseling for these needs as well as refer you for other special needs.  Your skin may become oily. If so, wash your face with mild soap, use non-greasy moisturizer and oil or cream based makeup. MEDICINES AND DRUG USE IN PREGNANCY  Take prenatal vitamins as directed. The vitamin should contain 1 milligram of folic acid. Keep all vitamins out of reach of children. Only a couple vitamins or tablets containing iron may be fatal to a baby or young child when ingested.  Avoid use of all medicines, including herbs, over-the-counter medicines, not prescribed or suggested by your caregiver. Only take over-the-counter or prescription medicines for pain, discomfort, or fever as directed by your caregiver. Do not use aspirin.  Let your caregiver also know about herbs you may be using.  Alcohol is related to a number of birth defects. This includes fetal alcohol syndrome. All alcohol, in any form, should be avoided completely. Smoking will cause low birth rate and premature babies.  Street or illegal drugs are very harmful to the baby. They are absolutely forbidden. A baby born to an addicted mother will be addicted at birth. The baby will go through the same withdrawal an adult does. SEEK MEDICAL CARE IF:  You have any concerns or worries during your pregnancy. It is better to call with your questions if you feel they cannot wait, rather than worry about them. SEEK IMMEDIATE  MEDICAL CARE IF:   An unexplained oral temperature above 102 F (38.9 C) develops, or as your caregiver suggests.  You have leaking of fluid from the vagina (birth canal). If leaking membranes are suspected, take your temperature and tell your caregiver of this when you call.  There is vaginal spotting, bleeding, or passing clots. Tell your caregiver of the amount and how many pads are used. Light spotting in pregnancy is common, especially following intercourse.  You develop a bad smelling vaginal discharge with a change in the color from clear to white.  You continue to feel   sick to your stomach (nauseated) and have no relief from remedies suggested. You vomit blood or coffee ground-like materials.  You lose more than 2 pounds of weight or gain more than 2 pounds of weight over 1 week, or as suggested by your caregiver.  You notice swelling of your face, hands, feet, or legs.  You get exposed to German measles and have never had them.  You are exposed to fifth disease or chickenpox.  You develop belly (abdominal) pain. Round ligament discomfort is a common non-cancerous (benign) cause of abdominal pain in pregnancy. Your caregiver still must evaluate you.  You develop a bad headache that does not go away.  You develop fever, diarrhea, pain with urination, or shortness of breath.  You develop visual problems, blurry, or double vision.  You fall or are in a car accident or any kind of trauma.  There is mental or physical violence at home. Document Released: 06/02/2001 Document Revised: 03/02/2012 Document Reviewed: 12/05/2008 [employer] Patient Information 2014 [employer].  Breastfeeding A change in hormones during your pregnancy causes growth of your breast tissue and an increase in number and size of milk ducts. The hormone prolactin allows proteins, sugars, and fats from your blood supply to make breast milk in your milk-producing glands. The hormone progesterone prevents  breast milk from being released before the birth of your baby. After the birth of your baby, your progesterone level decreases allowing breast milk to be released. Thoughts of your baby, as well as his or her sucking or crying, can stimulate the release of milk from the milk-producing glands. Deciding to breastfeed (nurse) is one of the best choices you can make for you and your baby. The information that follows gives a brief review of the benefits, as well as other important skills to know about breastfeeding. BENEFITS OF BREASTFEEDING For your baby  The first milk (colostrum) helps your baby's digestive system function better.   There are antibodies in your milk that help your baby fight off infections.   Your baby has a lower incidence of asthma, allergies, and sudden infant death syndrome (SIDS).   The nutrients in breast milk are better for your baby than infant formulas.  Breast milk improves your baby's brain development.   Your baby will have less gas, colic, and constipation.  Your baby is less likely to develop other conditions, such as childhood obesity, asthma, or diabetes mellitus. For you  Breastfeeding helps develop a very special bond between you and your baby.   Breastfeeding is convenient, always available at the correct temperature, and costs nothing.   Breastfeeding helps to burn calories and helps you lose the weight gained during pregnancy.   Breastfeeding makes your uterus contract back down to normal size faster and slows bleeding following delivery.   Breastfeeding mothers have a lower risk of developing osteoporosis or breast or ovarian cancer later in life.  BREASTFEEDING FREQUENCY  A healthy, full-term baby may breastfeed as often as every hour or space his or her feedings to every 3 hours. Breastfeeding frequency will vary from baby to baby.   Newborns should be fed no less than every 2 3 hours during the day and every 4 5 hours during the  night. You should breastfeed a minimum of 8 feedings in a 24 hour period.  Awaken your baby to breastfeed if it has been 3 4 hours since the last feeding.  Breastfeed when you feel the need to reduce the fullness of your breasts or when   your newborn shows signs of hunger. Signs that your baby may be hungry include:  Increased alertness or activity.  Stretching.  Movement of the head from side to side.  Movement of the head and opening of the mouth when the corner of the mouth or cheek is stroked (rooting).  Increased sucking sounds, smacking lips, cooing, sighing, or squeaking.  Hand-to-mouth movements.  Increased sucking of fingers or hands.  Fussing.  Intermittent crying.  Signs of extreme hunger will require calming and consoling before you try to feed your baby. Signs of extreme hunger may include:  Restlessness.  A loud, strong cry.  Screaming.  Frequent feeding will help you make more milk and will help prevent problems, such as sore nipples and engorgement of the breasts.  BREASTFEEDING   Whether lying down or sitting, be sure that the baby's abdomen is facing your abdomen.   Support your breast with 4 fingers under your breast and your thumb above your nipple. Make sure your fingers are well away from your nipple and your baby's mouth.   Stroke your baby's lips gently with your finger or nipple.   When your baby's mouth is open wide enough, place all of your nipple and as much of the colored area around your nipple (areola) as possible into your baby's mouth.  More areola should be visible above his or her upper lip than below his or her lower lip.  Your baby's tongue should be between his or her lower gum and your breast.  Ensure that your baby's mouth is correctly positioned around the nipple (latched). Your baby's lips should create a seal on your breast.  Signs that your baby has effectively latched onto your nipple include:  Tugging or sucking  without pain.  Swallowing heard between sucks.  Absent click or smacking sound.  Muscle movement above and in front of his or her ears with sucking.  Your baby must suck about 2 3 minutes in order to get your milk. Allow your baby to feed on each breast as long as he or she wants. Nurse your baby until he or she unlatches or falls asleep at the first breast, then offer the second breast.  Signs that your baby is full and satisfied include:  A gradual decrease in the number of sucks or complete cessation of sucking.  Falling asleep.  Extension or relaxation of his or her body.  Retention of a small amount of milk in his or her mouth.  Letting go of your breast by himself or herself.  Signs of effective breastfeeding in you include:  Breasts that have increased firmness, weight, and size prior to feeding.  Breasts that are softer after nursing.  Increased milk volume, as well as a change in milk consistency and color by the 5th day of breastfeeding.  Breast fullness relieved by breastfeeding.  Nipples are not sore, cracked, or bleeding.  If needed, break the suction by putting your finger into the corner of your baby's mouth and sliding your finger between his or her gums. Then, remove your breast from his or her mouth.  It is common for babies to spit up a small amount after a feeding.  Babies often swallow air during feeding. This can make babies fussy. Burping your baby between breasts can help with this.  Vitamin D supplements are recommended for babies who get only breast milk.  Avoid using a pacifier during your baby's first 4 6 weeks.  Avoid supplemental feedings of water, formula, or   juice in place of breastfeeding. Breast milk is all the food your baby needs. It is not necessary for your baby to have water or formula. Your breasts will make more milk if supplemental feedings are avoided during the early weeks. HOW TO TELL WHETHER YOUR BABY IS GETTING ENOUGH BREAST  MILK Wondering whether or not your baby is getting enough milk is a common concern among mothers. You can be assured that your baby is getting enough milk if:   Your baby is actively sucking and you hear swallowing.   Your baby seems relaxed and satisfied after a feeding.   Your baby nurses at least 8 12 times in a 24 hour time period.  During the first 3 5 days of age:  Your baby is wetting at least 3 5 diapers in a 24 hour period. The urine should be clear and pale yellow.  Your baby is having at least 3 4 stools in a 24 hour period. The stool should be soft and yellow.  At 5 7 days of age, your baby is having at least 3 6 stools in a 24 hour period. The stool should be seedy and yellow by 5 days of age.  Your baby has a weight loss less than 7 10% during the first 3 days of age.  Your baby does not lose weight after 3 7 days of age.  Your baby gains 4 7 ounces each week after he or she is 4 days of age.  Your baby gains weight by 5 days of age and is back to birth weight within 2 weeks. ENGORGEMENT In the first week after your baby is born, you may experience extremely full breasts (engorgement). When engorged, your breasts may feel heavy, warm, or tender to the touch. Engorgement peaks within 24 48 hours after delivery of your baby.  Engorgement may be reduced by:  Continuing to breastfeed.  Increasing the frequency of breastfeeding.  Taking warm showers or applying warm, moist heat to your breasts just before each feeding. This increases circulation and helps the milk flow.   Gently massaging your breast before and during the feedings. With your fingertips, massage from your chest wall towards your nipple in a circular motion.   Ensuring that your baby empties at least one breast at every feeding. It also helps to start the next feeding on the opposite breast.   Expressing breast milk by hand or by using a breast pump to empty the breasts if your baby is sleepy, or  not nursing well. You may also want to express milk if you are returning to work oryou feel you are getting engorged.  Ensuring your baby is latched on and positioned properly while breastfeeding. If you follow these suggestions, your engorgement should improve in 24 48 hours. If you are still experiencing difficulty, call your lactation consultant or caregiver.  CARING FOR YOURSELF Take care of your breasts.  Bathe or shower daily.   Avoid using soap on your nipples.   Wear a supportive bra. Avoid wearing underwire style bras.  Air dry your nipples for a 3 4minutes after each feeding.   Use only cotton bra pads to absorb breast milk leakage. Leaking of breast milk between feedings is normal.   Use only pure lanolin on your nipples after nursing. You do not need to wash it off before feeding your baby again. Another option is to express a few drops of breast milk and gently massage that milk into your nipples.  Continue   breast self-awareness checks. Take care of yourself.  Eat healthy foods. Alternate 3 meals with 3 snacks.  Avoid foods that you notice affect your baby in a bad way.  Drink milk, fruit juice, and water to satisfy your thirst (about 8 glasses a day).   Rest often, relax, and take your prenatal vitamins to prevent fatigue, stress, and anemia.  Avoid chewing and smoking tobacco.  Avoid alcohol and drug use.  Take over-the-counter and prescribed medicine only as directed by your caregiver or pharmacist. You should always check with your caregiver or pharmacist before taking any new medicine, vitamin, or herbal supplement.  Know that pregnancy is possible while breastfeeding. If desired, talk to your caregiver about family planning and safe birth control methods that may be used while breastfeeding. SEEK MEDICAL CARE IF:   You feel like you want to stop breastfeeding or have become frustrated with breastfeeding.  You have painful breasts or nipples.  Your  nipples are cracked or bleeding.  Your breasts are red, tender, or warm.  You have a swollen area on either breast.  You have a fever or chills.  You have nausea or vomiting.  You have drainage from your nipples.  Your breasts do not become full before feedings by the 5th day after delivery.  You feel sad and depressed.  Your baby is too sleepy to eat well.  Your baby is having trouble sleeping.   Your baby is wetting less than 3 diapers in a 24 hour period.  Your baby has less than 3 stools in a 24 hour period.  Your baby's skin or the white part of his or her eyes becomes more yellow.   Your baby is not gaining weight by 5 days of age. MAKE SURE YOU:   Understand these instructions.  Will watch your condition.  Will get help right away if you are not doing well or get worse. Document Released: 06/08/2005 Document Revised: 03/02/2012 Document Reviewed: 01/13/2012 [employer] Patient Information 2014 [employer].  

## 2013-02-16 NOTE — Progress Notes (Signed)
P= 85, c/o intermittent pain LLQ.  C/o pain in left thigh outer portion - has sharp pains at times. Given new patient information. Discussed bmi, weight gain.

## 2013-02-16 NOTE — Progress Notes (Signed)
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 Labs WNL Still smokes cigarettes and marijuana--advised to cut down/quit.

## 2013-02-17 ENCOUNTER — Encounter: Payer: Self-pay | Admitting: Family Medicine

## 2013-02-17 LAB — GC/CHLAMYDIA PROBE AMP
CT Probe RNA: NEGATIVE
GC Probe RNA: NEGATIVE

## 2013-02-18 LAB — CULTURE, OB URINE
Colony Count: NO GROWTH
Organism ID, Bacteria: NO GROWTH

## 2013-02-21 ENCOUNTER — Telehealth: Payer: Self-pay | Admitting: General Practice

## 2013-02-21 NOTE — Telephone Encounter (Signed)
Called patient back stating I was returning her phone call and that it is not okay to take motrin in pregnancy but she can take Tylenol. Patient verbalized understanding and stated that the ER doctors told her she needs to take an antiinflammatory or a course of steroids. Reviewed information with Dr Macon Large, who stated patient needed to come in to be evaluated since she was not seen by anyone here and no information is in Epic about this. Informed patient of need to come in for evaluation. Patient verbalized understanding. Told patient someone from the front office will contact her this afternoon with a sooner appt. Patient verbalized understanding and had no further questions

## 2013-02-21 NOTE — Telephone Encounter (Signed)
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

## 2013-02-24 ENCOUNTER — Encounter: Payer: Self-pay | Admitting: *Deleted

## 2013-02-24 DIAGNOSIS — O34219 Maternal care for unspecified type scar from previous cesarean delivery: Secondary | ICD-10-CM

## 2013-02-28 ENCOUNTER — Encounter: Payer: Self-pay | Admitting: Obstetrics and Gynecology

## 2013-03-06 ENCOUNTER — Encounter: Payer: Managed Care, Other (non HMO) | Admitting: Obstetrics & Gynecology

## 2013-03-08 ENCOUNTER — Emergency Department (HOSPITAL_COMMUNITY): Payer: Managed Care, Other (non HMO)

## 2013-03-08 ENCOUNTER — Telehealth: Payer: Self-pay

## 2013-03-08 ENCOUNTER — Emergency Department (HOSPITAL_COMMUNITY)
Admission: EM | Admit: 2013-03-08 | Discharge: 2013-03-09 | Disposition: A | Discharge status: Home / Self Care | Payer: Managed Care, Other (non HMO) | Attending: Emergency Medicine | Admitting: Emergency Medicine

## 2013-03-08 ENCOUNTER — Encounter (HOSPITAL_COMMUNITY): Payer: Self-pay | Admitting: *Deleted

## 2013-03-08 DIAGNOSIS — G8929 Other chronic pain: Secondary | ICD-10-CM | POA: Insufficient documentation

## 2013-03-08 DIAGNOSIS — M545 Low back pain, unspecified: Secondary | ICD-10-CM | POA: Insufficient documentation

## 2013-03-08 DIAGNOSIS — O9989 Other specified diseases and conditions complicating pregnancy, childbirth and the puerperium: Secondary | ICD-10-CM | POA: Insufficient documentation

## 2013-03-08 DIAGNOSIS — Z8619 Personal history of other infectious and parasitic diseases: Secondary | ICD-10-CM | POA: Insufficient documentation

## 2013-03-08 DIAGNOSIS — O99330 Smoking (tobacco) complicating pregnancy, unspecified trimester: Secondary | ICD-10-CM | POA: Insufficient documentation

## 2013-03-08 DIAGNOSIS — N949 Unspecified condition associated with female genital organs and menstrual cycle: Secondary | ICD-10-CM | POA: Insufficient documentation

## 2013-03-08 DIAGNOSIS — Z79899 Other long term (current) drug therapy: Secondary | ICD-10-CM | POA: Insufficient documentation

## 2013-03-08 DIAGNOSIS — Z8659 Personal history of other mental and behavioral disorders: Secondary | ICD-10-CM | POA: Insufficient documentation

## 2013-03-08 DIAGNOSIS — R300 Dysuria: Secondary | ICD-10-CM | POA: Insufficient documentation

## 2013-03-08 DIAGNOSIS — R109 Unspecified abdominal pain: Secondary | ICD-10-CM | POA: Insufficient documentation

## 2013-03-08 LAB — WET PREP, GENITAL
Clue Cells Wet Prep HPF POC: NONE SEEN
Trich, Wet Prep: NONE SEEN
WBC, Wet Prep HPF POC: NONE SEEN

## 2013-03-08 LAB — URINALYSIS W MICROSCOPIC + REFLEX CULTURE
Bilirubin Urine: NEGATIVE
Glucose, UA: NEGATIVE mg/dL
Hgb urine dipstick: NEGATIVE
Ketones, ur: NEGATIVE mg/dL
Leukocytes, UA: NEGATIVE
Nitrite: NEGATIVE
Protein, ur: NEGATIVE mg/dL
Specific Gravity, Urine: >1.03 — ABNORMAL HIGH (ref 1.005–1.030)
Urobilinogen, UA: 0.2 mg/dL (ref 0.0–1.0)
pH: 6 (ref 5.0–8.0)

## 2013-03-08 LAB — PREGNANCY, URINE: Preg Test, Ur: POSITIVE — AB

## 2013-03-08 MED ORDER — HYDROCODONE-ACETAMINOPHEN 5-325 MG PO TABS
1.0000 | ORAL_TABLET | Freq: Once | ORAL | Status: AC
  Administered 2013-03-08: 1 via ORAL
  Filled 2013-03-08: qty 1

## 2013-03-08 MED ORDER — VALACYCLOVIR HCL 500 MG PO TABS: ORAL_TABLET | ORAL | Status: DC

## 2013-03-08 NOTE — Telephone Encounter (Signed)
Called pt and informed pt that the Rx that she had requested has been sent to her pharmacy at [facility] on [address]

## 2013-03-08 NOTE — Telephone Encounter (Signed)
Pt called requesting a refill on her valtrex.

## 2013-03-08 NOTE — ED Notes (Addendum)
Pt reporting pelvic pain for about 1 week.  Pt reports she is 4 months pregnant.  Denies bleeding or discharge.  States that she is going to the methadone clinic and isn't sure if she is having a miscarriage or not.  Pt also reporting pain with urination.  States that she did go to an urgent care and was told no infection.  Pt also reporting active herpes outbreak but doesn't believe pain is due to that.

## 2013-03-09 LAB — RPR: RPR Ser Ql: NONREACTIVE

## 2013-03-09 NOTE — ED Provider Notes (Signed)
Medical screening examination/treatment/procedure(s) were performed by non-physician practitioner and as supervising physician I was immediately available for consultation/collaboration.   Hanley Seamen, MD 03/09/13 [phone]
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
## 2013-03-10 ENCOUNTER — Other Ambulatory Visit: Payer: Self-pay | Admitting: Family Medicine

## 2013-03-10 ENCOUNTER — Ambulatory Visit (HOSPITAL_COMMUNITY)
Admission: RE | Admit: 2013-03-10 | Discharge: 2013-03-10 | Disposition: A | Discharge status: Home / Self Care | Payer: Managed Care, Other (non HMO) | Source: Ambulatory Visit | Attending: Family Medicine | Admitting: Family Medicine

## 2013-03-10 ENCOUNTER — Encounter: Payer: Self-pay | Admitting: Family Medicine

## 2013-03-10 DIAGNOSIS — O34219 Maternal care for unspecified type scar from previous cesarean delivery: Secondary | ICD-10-CM

## 2013-03-10 DIAGNOSIS — O99340 Other mental disorders complicating pregnancy, unspecified trimester: Secondary | ICD-10-CM

## 2013-03-10 DIAGNOSIS — J4540 Moderate persistent asthma, uncomplicated: Secondary | ICD-10-CM

## 2013-03-10 DIAGNOSIS — F1120 Opioid dependence, uncomplicated: Secondary | ICD-10-CM | POA: Insufficient documentation

## 2013-03-10 DIAGNOSIS — Z720 Tobacco use: Secondary | ICD-10-CM

## 2013-03-10 DIAGNOSIS — F1920 Other psychoactive substance dependence, uncomplicated: Secondary | ICD-10-CM

## 2013-03-10 DIAGNOSIS — O358XX0 Maternal care for other (suspected) fetal abnormality and damage, not applicable or unspecified: Secondary | ICD-10-CM | POA: Insufficient documentation

## 2013-03-10 DIAGNOSIS — O99330 Smoking (tobacco) complicating pregnancy, unspecified trimester: Secondary | ICD-10-CM | POA: Insufficient documentation

## 2013-03-16 ENCOUNTER — Encounter: Payer: Managed Care, Other (non HMO) | Admitting: Obstetrics and Gynecology

## 2013-04-13 ENCOUNTER — Telehealth: Payer: Self-pay | Admitting: *Deleted

## 2013-04-13 ENCOUNTER — Ambulatory Visit (INDEPENDENT_AMBULATORY_CARE_PROVIDER_SITE_OTHER): Payer: Managed Care, Other (non HMO) | Admitting: Obstetrics & Gynecology

## 2013-04-13 VITALS — BP 112/75 | Temp 98.3°F | Wt 147.9 lb

## 2013-04-13 DIAGNOSIS — O99340 Other mental disorders complicating pregnancy, unspecified trimester: Secondary | ICD-10-CM

## 2013-04-13 DIAGNOSIS — R05 Cough: Secondary | ICD-10-CM

## 2013-04-13 DIAGNOSIS — M549 Dorsalgia, unspecified: Secondary | ICD-10-CM

## 2013-04-13 DIAGNOSIS — F1920 Other psychoactive substance dependence, uncomplicated: Secondary | ICD-10-CM

## 2013-04-13 DIAGNOSIS — O34219 Maternal care for unspecified type scar from previous cesarean delivery: Secondary | ICD-10-CM

## 2013-04-13 DIAGNOSIS — R21 Rash and other nonspecific skin eruption: Secondary | ICD-10-CM | POA: Insufficient documentation

## 2013-04-13 LAB — POCT URINALYSIS DIP (DEVICE)
Ketones, ur: NEGATIVE mg/dL
Leukocytes, UA: NEGATIVE
Nitrite: NEGATIVE
Protein, ur: NEGATIVE mg/dL
pH: 6 (ref 5.0–8.0)

## 2013-04-13 MED ORDER — HYDROXYZINE HCL 50 MG PO TABS: 50.0000 mg | ORAL_TABLET | Freq: Three times a day (TID) | ORAL | Status: DC | PRN

## 2013-04-13 MED ORDER — BENZONATATE 200 MG PO CAPS: 200.0000 mg | ORAL_CAPSULE | Freq: Three times a day (TID) | ORAL | Status: DC | PRN

## 2013-04-13 MED ORDER — LIDOCAINE 5 % EX PTCH: 1.0000 | MEDICATED_PATCH | CUTANEOUS | Status: DC

## 2013-04-13 MED ORDER — PRENATAL PLUS 27-1 MG PO TABS: 1.0000 | ORAL_TABLET | Freq: Every day | ORAL | Status: DC

## 2013-04-13 NOTE — Progress Notes (Signed)
Lidoderm patch prescribed for back pain. Atarax for rash, already treated with permentrin. No other complaints or concerns.  Routine obstetric precautions reviewed. Patient to transfer care to [facility].

## 2013-04-13 NOTE — Progress Notes (Signed)
Pulse- 112  Pain/pressure-lower abd Pt reports having severe back pain.  Pt also has a rash all over body.

## 2013-04-13 NOTE — Patient Instructions (Addendum)
Return to clinic for any obstetric concerns or go to [facility] for evaluation Tetanus, Diphtheria (Td) or Tetanus, Diphtheria, Pertussis (Tdap) Vaccine What You Need to Know WHY GET VACCINATED? Tetanus, diphtheria and pertussis can be very serious diseases. TETANUS (Lockjaw) causes painful muscle spasms and stiffness, usually all over the body.  Tetanus can lead to tightening of muscles in the head and neck so the victim cannot open his mouth or swallow, or sometimes even breathe. Tetanus kills about 1 out of 5 people who are infected. DIPHTHERIA can cause a thick membrane to cover the back of the throat.  Diphtheria can lead to breathing problems, paralysis, heart failure, and even death. PERTUSSIS (Whooping Cough) causes severe coughing spells which can lead to difficulty breathing, vomiting, and disturbed sleep.  Pertussis can lead to weight loss, incontinence, rib fractures and passing out from violent coughing. Up to 2 in 100 adolescents and 5 in 100 adults with pertussis are hospitalized or have complications, including pneumonia and death. These 3 diseases are all caused by bacteria. Diphtheria and pertussis are spread from person to person. Tetanus enters the body through cuts, scratches, or wounds. The [place] saw as many as 200,000 cases a year of diphtheria and pertussis before vaccines were available, and hundreds of cases of tetanus. Since then, tetanus and diphtheria cases have dropped by about 99% and pertussis cases by about 92%. Children 62 years of age and younger get DTaP vaccine to protect them from these three diseases. But older children, adolescents, and adults need protection too. VACCINES FOR ADOLESCENTS AND ADULTS: TD AND TDAP Two vaccines are available to protect people 60 years of age and older from these diseases:  Td vaccine has been used for many years. It protects against tetanus and diphtheria.  Tdap vaccine was licensed in 2005. It is the first vaccine for  adolescents and adults that protects against pertussis as well as tetanus and diphtheria. A Td booster dose is recommended every 10 years. Tdap is given only once. WHICH VACCINE, AND WHEN? Ages 7 through 18 years  A dose of Tdap is recommended at age 60 or 3. This dose could be given as early as age 41 for children who missed one or more childhood doses of DTaP.  Children and adolescents who did not get a complete series of DTaP shots by age 12 should complete the series using a combination of Td and Tdap. Age 19 years and Older  All adults should get a booster dose of Td every 10 years. Adults under 65 who have never gotten Tdap should get a dose of Tdap as their next booster dose. Adults 65 and older may get one booster dose of Tdap.  Adults (including women who may become pregnant and adults 23 and older) who expect to have close contact with a baby younger than 33 months of age should get a dose of Tdap to help protect the baby from pertussis.  Healthcare professionals who have direct patient contact in hospitals or clinics should get one dose of Tdap. Protection After a Wound  A person who gets a severe cut or burn might need a dose of Td or Tdap to prevent tetanus infection. Tdap should be used for anyone who has never had a dose previously. Td should be used if Tdap is not available, or for:  Anybody who has already had a dose of Tdap.  Children 7 through 31 years of age who completed the childhood DTaP series.  Adults 65 and older.  Pregnant Women  Pregnant women who have never had a dose of Tdap should get one, after the 20th week of gestation and preferably during the 3rd trimester. If they do not get Tdap during their pregnancy they should get a dose as soon as possible after delivery. Pregnant women who have previously received Tdap and need tetanus or diphtheria vaccine while pregnant should get Td. Tdap and Td may be given at the same time as other vaccines. SOME PEOPLE SHOULD  NOT BE VACCINATED OR SHOULD WAIT  Anyone who has had a life-threatening allergic reaction after a dose of any tetanus, diphtheria, or pertussis containing vaccine should not get Td or Tdap.  Anyone who has a severe allergy to any component of a vaccine should not get that vaccine. Tell your doctor if the person getting the vaccine has any severe allergies.  Anyone who had a coma, or long or multiple seizures within 7 days after a dose of DTP or DTaP should not get Tdap, unless a cause other than the vaccine was found. These people may get Td.  Talk to your doctor if the person getting either vaccine:  Has epilepsy or another nervous system problem.  Had severe swelling or severe pain after a previous dose of DTP, DTaP, DT, Td, or Tdap vaccine.  Has had Guillain Barr Syndrome (GBS). Anyone who has a moderate or severe illness on the day the shot is scheduled should usually wait until they recover before getting Tdap or Td vaccine. A person with a mild illness or low fever can usually be vaccinated. WHAT ARE THE RISKS FROM TDAP AND TD VACCINES? With a vaccine, as with any medicine, there is always a small risk of a life-threatening allergic reaction or other serious problem. Brief fainting spells and related symptoms (such as jerking movements) can happen after any medical procedure, including vaccination. Sitting or lying down for about 15 minutes after a vaccination can help prevent fainting and injuries caused by falls. Tell your doctor if the patient feels dizzy or lightheaded, or has vision changes or ringing in the ears. Getting tetanus, diphtheria, or pertussis would be much more likely to lead to severe problems than getting either Td or Tdap vaccine. Problems reported after Td and Tdap vaccines are listed below. Mild Problems (noticeable, but did not interfere with activities) Tdap  Pain (about 3 in 4 adolescents and 2 in 3 adults).  Redness or swelling (about 1 in 5).  Mild fever  of at least 100.4 F (38 C) (up to about 1 in 25 adolescents and 1 in 100 adults).  Headache (about 4 in 10 adolescents and 3 in 10 adults).  Tiredness (about 1 in 3 adolescents and 1 in 4 adults).  Nausea, vomiting, diarrhea, or stomach ache (up to 1 in 4 adolescents and 1 in 10 adults).  Chills, body aches, sore joints, rash, or swollen glands (uncommon). Td  Pain (up to about 8 in 10).  Redness or swelling at the injection site (up to about 1 in 3).  Mild fever (up to about 1 in 15).  Headache or tiredness (uncommon). Moderate Problems (interfered with activities, but did not require medical attention) Tdap  Pain at the injection site (about 1 in 20 adolescents and 1 in 100 adults).  Redness or swelling at the injection site (up to about 1 in 16 adolescents and 1 in 25 adults).  Fever over 102 F (38.9 C) (about 1 in 100 adolescents and 1 in 250 adults).  Headache (  1 in 300).  Nausea, vomiting, diarrhea, or stomach ache (up to 3 in 100 adolescents and 1 in 100 adults). Td  Fever over 102 F (38.9 C) (rare). Tdap or Td  Extensive swelling of the arm where the shot was given (up to about 3 in 100). Severe Problems (Unable to perform usual activities; required medical attention) Tdap or Td  Swelling, severe pain, bleeding, and redness in the arm where the shot was given (rare). A severe allergic reaction could occur after any vaccine. They are estimated to occur less than once in a million doses. WHAT IF THERE IS A SEVERE REACTION? What should I look for? Any unusual condition, such as a severe allergic reaction or a high fever. If a severe allergic reaction occurred, it would be within a few minutes to an hour after the shot. Signs of a serious allergic reaction can include difficulty breathing, weakness, hoarseness or wheezing, a fast heartbeat, hives, dizziness, paleness, or swelling of the throat. What should I do?  Call a doctor, or get the person to a doctor  right away.  Tell your doctor what happened, the date and time it happened, and when the vaccination was given.  Ask your provider to report the reaction by filing a Vaccine Adverse Event Reporting System (VAERS) form. Or, you can file this report through the VAERS website at www.vaers.LAgents.no or by calling 1-7870816077. VAERS does not provide medical advice. THE NATIONAL VACCINE INJURY COMPENSATION PROGRAM The National Vaccine Injury Compensation Program (VICP) was created in 1986. Persons who believe they may have been injured by a vaccine can learn about the program and about filing a claim by calling 1-202-463-1795 or visiting the VICP website at SpiritualWord.at. HOW CAN I LEARN MORE?  Your doctor can give you the vaccine package insert or suggest other sources of information.  Call your local or state health department.  Contact the Centers for Disease Control and Prevention (CDC):  Call (865)471-8480 (1-800-CDC-INFO).  Visit the [facility] website at PicCapture.uy. CDC Td and Tdap Interim VIS (07/15/10) Document Released: 04/05/2006 Document Revised: 08/31/2011 Document Reviewed: 07/15/2010 ExitCare Patient Information 2014 [place], [place].

## 2013-04-13 NOTE — Telephone Encounter (Signed)
Called pt to set up routine prenatal care visit and 1HrGTT at the [facility] location. LMOM at cell number and LM w/female at home number. Need her to return my call to set up appt

## 2013-04-13 NOTE — Telephone Encounter (Signed)
Message copied by Arne Cleveland on Thu Apr 13, 2013  2:17 PM ------      Message from: Jaynie Collins A      Created: Thu Apr 13, 2013 10:42 AM      Regarding: Needs appt in 3 weeks for 1 hr GTT, OB visit, etc in [place]       Please schedule and call patient.            Thanks!            UAA ------

## 2013-05-02 ENCOUNTER — Ambulatory Visit (INDEPENDENT_AMBULATORY_CARE_PROVIDER_SITE_OTHER): Payer: Managed Care, Other (non HMO) | Admitting: Obstetrics & Gynecology

## 2013-05-02 VITALS — BP 106/69 | Wt 155.0 lb

## 2013-05-02 DIAGNOSIS — Z3480 Encounter for supervision of other normal pregnancy, unspecified trimester: Secondary | ICD-10-CM

## 2013-05-02 DIAGNOSIS — O34219 Maternal care for unspecified type scar from previous cesarean delivery: Secondary | ICD-10-CM

## 2013-05-02 NOTE — Patient Instructions (Signed)

## 2013-05-02 NOTE — Progress Notes (Signed)
Planning repeat cesarean, counseled re TOLAC vs cesarean.

## 2013-05-16 ENCOUNTER — Other Ambulatory Visit (INDEPENDENT_AMBULATORY_CARE_PROVIDER_SITE_OTHER): Payer: Managed Care, Other (non HMO) | Admitting: *Deleted

## 2013-05-16 DIAGNOSIS — Z3480 Encounter for supervision of other normal pregnancy, unspecified trimester: Secondary | ICD-10-CM

## 2013-05-16 DIAGNOSIS — Z0189 Encounter for other specified special examinations: Secondary | ICD-10-CM

## 2013-05-16 DIAGNOSIS — Z23 Encounter for immunization: Secondary | ICD-10-CM

## 2013-05-17 ENCOUNTER — Encounter: Payer: Self-pay | Admitting: Obstetrics & Gynecology

## 2013-05-17 DIAGNOSIS — D72829 Elevated white blood cell count, unspecified: Secondary | ICD-10-CM | POA: Insufficient documentation

## 2013-05-17 LAB — GLUCOSE TOLERANCE, 1 HOUR (50G) W/O FASTING: Glucose, 1 Hour GTT: 51 mg/dL — ABNORMAL LOW (ref 70–140)

## 2013-05-17 LAB — CBC
HCT: 38.4 % (ref 36.0–46.0)
MCH: 32.6 pg (ref 26.0–34.0)
MCHC: 33.3 g/dL (ref 30.0–36.0)
MCV: 97.7 fL (ref 78.0–100.0)
Platelets: 171 10*3/uL (ref 150–400)
RDW: 13.9 % (ref 11.5–15.5)
WBC: 17.8 10*3/uL — ABNORMAL HIGH (ref 4.0–10.5)

## 2013-05-17 LAB — HIV ANTIBODY (ROUTINE TESTING W REFLEX): HIV: NONREACTIVE

## 2013-05-30 ENCOUNTER — Ambulatory Visit (INDEPENDENT_AMBULATORY_CARE_PROVIDER_SITE_OTHER): Payer: Managed Care, Other (non HMO) | Admitting: Obstetrics & Gynecology

## 2013-05-30 VITALS — BP 96/64 | Wt 158.0 lb

## 2013-05-30 DIAGNOSIS — O365990 Maternal care for other known or suspected poor fetal growth, unspecified trimester, not applicable or unspecified: Secondary | ICD-10-CM

## 2013-05-30 DIAGNOSIS — O365931 Maternal care for other known or suspected poor fetal growth, third trimester, fetus 1: Secondary | ICD-10-CM

## 2013-05-30 DIAGNOSIS — O34219 Maternal care for unspecified type scar from previous cesarean delivery: Secondary | ICD-10-CM

## 2013-05-30 DIAGNOSIS — F1920 Other psychoactive substance dependence, uncomplicated: Secondary | ICD-10-CM

## 2013-05-30 NOTE — Progress Notes (Signed)
Follow up ultrasound ordered at [facility] due to small for dates, methadone use.  No other complaints or concerns.  Fetal movement and labor precautions reviewed.

## 2013-05-30 NOTE — Progress Notes (Signed)
P=[id number] 

## 2013-05-30 NOTE — Patient Instructions (Signed)
Return to clinic for any obstetric concerns or go to [facility] for evaluation  

## 2013-06-06 ENCOUNTER — Ambulatory Visit (HOSPITAL_COMMUNITY)
Admission: RE | Admit: 2013-06-06 | Discharge: 2013-06-06 | Disposition: A | Discharge status: Home / Self Care | Payer: Medicaid Other | Source: Ambulatory Visit | Attending: Obstetrics & Gynecology | Admitting: Obstetrics & Gynecology

## 2013-06-06 DIAGNOSIS — O99330 Smoking (tobacco) complicating pregnancy, unspecified trimester: Secondary | ICD-10-CM | POA: Insufficient documentation

## 2013-06-06 DIAGNOSIS — O34219 Maternal care for unspecified type scar from previous cesarean delivery: Secondary | ICD-10-CM | POA: Insufficient documentation

## 2013-06-06 DIAGNOSIS — O365931 Maternal care for other known or suspected poor fetal growth, third trimester, fetus 1: Secondary | ICD-10-CM

## 2013-06-06 DIAGNOSIS — F1120 Opioid dependence, uncomplicated: Secondary | ICD-10-CM | POA: Insufficient documentation

## 2013-06-06 DIAGNOSIS — F1920 Other psychoactive substance dependence, uncomplicated: Secondary | ICD-10-CM | POA: Insufficient documentation

## 2013-06-13 ENCOUNTER — Encounter: Payer: Managed Care, Other (non HMO) | Admitting: Obstetrics & Gynecology

## 2013-06-23 ENCOUNTER — Inpatient Hospital Stay (HOSPITAL_COMMUNITY): Payer: Managed Care, Other (non HMO)

## 2013-06-23 ENCOUNTER — Encounter (HOSPITAL_COMMUNITY): Payer: Self-pay | Admitting: *Deleted

## 2013-06-23 ENCOUNTER — Inpatient Hospital Stay (HOSPITAL_COMMUNITY)
Admission: RE | Admit: 2013-06-23 | Discharge: 2013-06-30 | DRG: 781 | Disposition: A | Discharge status: Home / Self Care | Payer: Managed Care, Other (non HMO) | Source: Ambulatory Visit | Attending: Obstetrics and Gynecology | Admitting: Obstetrics and Gynecology

## 2013-06-23 DIAGNOSIS — D696 Thrombocytopenia, unspecified: Secondary | ICD-10-CM

## 2013-06-23 DIAGNOSIS — O9989 Other specified diseases and conditions complicating pregnancy, childbirth and the puerperium: Secondary | ICD-10-CM

## 2013-06-23 DIAGNOSIS — O99119 Other diseases of the blood and blood-forming organs and certain disorders involving the immune mechanism complicating pregnancy, unspecified trimester: Secondary | ICD-10-CM

## 2013-06-23 DIAGNOSIS — D689 Coagulation defect, unspecified: Secondary | ICD-10-CM

## 2013-06-23 DIAGNOSIS — Z720 Tobacco use: Secondary | ICD-10-CM

## 2013-06-23 DIAGNOSIS — O4700 False labor before 37 completed weeks of gestation, unspecified trimester: Secondary | ICD-10-CM

## 2013-06-23 DIAGNOSIS — O99340 Other mental disorders complicating pregnancy, unspecified trimester: Secondary | ICD-10-CM

## 2013-06-23 DIAGNOSIS — O4590 Premature separation of placenta, unspecified, unspecified trimester: Secondary | ICD-10-CM

## 2013-06-23 DIAGNOSIS — Z87891 Personal history of nicotine dependence: Secondary | ICD-10-CM

## 2013-06-23 DIAGNOSIS — O99320 Drug use complicating pregnancy, unspecified trimester: Secondary | ICD-10-CM

## 2013-06-23 DIAGNOSIS — O99113 Other diseases of the blood and blood-forming organs and certain disorders involving the immune mechanism complicating pregnancy, third trimester: Secondary | ICD-10-CM

## 2013-06-23 DIAGNOSIS — F1920 Other psychoactive substance dependence, uncomplicated: Secondary | ICD-10-CM

## 2013-06-23 DIAGNOSIS — O4593 Premature separation of placenta, unspecified, third trimester: Secondary | ICD-10-CM | POA: Diagnosis present

## 2013-06-23 DIAGNOSIS — O368390 Maternal care for abnormalities of the fetal heart rate or rhythm, unspecified trimester, not applicable or unspecified: Secondary | ICD-10-CM | POA: Diagnosis not present

## 2013-06-23 DIAGNOSIS — D72829 Elevated white blood cell count, unspecified: Secondary | ICD-10-CM | POA: Diagnosis present

## 2013-06-23 DIAGNOSIS — F1110 Opioid abuse, uncomplicated: Secondary | ICD-10-CM | POA: Diagnosis present

## 2013-06-23 DIAGNOSIS — O99891 Other specified diseases and conditions complicating pregnancy: Secondary | ICD-10-CM | POA: Diagnosis present

## 2013-06-23 DIAGNOSIS — R51 Headache: Secondary | ICD-10-CM | POA: Diagnosis not present

## 2013-06-23 DIAGNOSIS — J45909 Unspecified asthma, uncomplicated: Secondary | ICD-10-CM | POA: Diagnosis present

## 2013-06-23 DIAGNOSIS — O4690 Antepartum hemorrhage, unspecified, unspecified trimester: Secondary | ICD-10-CM | POA: Diagnosis present

## 2013-06-23 DIAGNOSIS — O34219 Maternal care for unspecified type scar from previous cesarean delivery: Secondary | ICD-10-CM | POA: Diagnosis present

## 2013-06-23 LAB — CBC
HCT: 34.6 % — ABNORMAL LOW (ref 36.0–46.0)
Hemoglobin: 12 g/dL (ref 12.0–15.0)
MCH: 32.6 pg (ref 26.0–34.0)
MCHC: 34.7 g/dL (ref 30.0–36.0)
MCV: 94 fL (ref 78.0–100.0)
PLATELETS: 114 10*3/uL — AB (ref 150–400)
RBC: 3.68 MIL/uL — ABNORMAL LOW (ref 3.87–5.11)
RDW: 13.1 % (ref 11.5–15.5)
WBC: 13.8 10*3/uL — ABNORMAL HIGH (ref 4.0–10.5)

## 2013-06-23 LAB — TYPE AND SCREEN
ABO/RH(D): O POS
Antibody Screen: NEGATIVE

## 2013-06-23 LAB — URINE MICROSCOPIC-ADD ON

## 2013-06-23 LAB — RAPID URINE DRUG SCREEN, HOSP PERFORMED
AMPHETAMINES: NOT DETECTED
Barbiturates: NOT DETECTED
Benzodiazepines: NOT DETECTED
COCAINE: NOT DETECTED
OPIATES: NOT DETECTED
TETRAHYDROCANNABINOL: NOT DETECTED

## 2013-06-23 LAB — URINALYSIS, ROUTINE W REFLEX MICROSCOPIC
BILIRUBIN URINE: NEGATIVE
Glucose, UA: NEGATIVE mg/dL
KETONES UR: 15 mg/dL — AB
Leukocytes, UA: NEGATIVE
NITRITE: NEGATIVE
PROTEIN: NEGATIVE mg/dL
Specific Gravity, Urine: 1.015 (ref 1.005–1.030)
UROBILINOGEN UA: 0.2 mg/dL (ref 0.0–1.0)
pH: 6.5 (ref 5.0–8.0)

## 2013-06-23 LAB — APTT: APTT: 31 s (ref 24–37)

## 2013-06-23 LAB — PROTIME-INR
INR: 1.12 (ref 0.00–1.49)
Prothrombin Time: 14.2 seconds (ref 11.6–15.2)

## 2013-06-23 LAB — ABO/RH: ABO/RH(D): O POS

## 2013-06-23 MED ORDER — DEXTROSE IN LACTATED RINGERS 5 % IV SOLN
INTRAVENOUS | Status: DC
  Administered 2013-06-23 – 2013-06-25 (×2): via INTRAVENOUS

## 2013-06-23 MED ORDER — NICOTINE 21 MG/24HR TD PT24
21.0000 mg | MEDICATED_PATCH | Freq: Every day | TRANSDERMAL | Status: DC
  Administered 2013-06-23 – 2013-06-30 (×8): 21 mg via TRANSDERMAL
  Filled 2013-06-23 (×8): qty 1

## 2013-06-23 MED ORDER — NALBUPHINE SYRINGE 5 MG/0.5 ML
5.0000 mg | INJECTION | INTRAMUSCULAR | Status: DC | PRN
  Administered 2013-06-23 – 2013-06-25 (×5): 5 mg via INTRAVENOUS
  Filled 2013-06-23 (×5): qty 0.5

## 2013-06-23 MED ORDER — DOCUSATE SODIUM 100 MG PO CAPS
100.0000 mg | ORAL_CAPSULE | Freq: Every day | ORAL | Status: DC
  Administered 2013-06-24 – 2013-06-27 (×4): 100 mg via ORAL
  Filled 2013-06-23 (×6): qty 1

## 2013-06-23 MED ORDER — ZOLPIDEM TARTRATE 5 MG PO TABS
5.0000 mg | ORAL_TABLET | Freq: Every evening | ORAL | Status: DC | PRN
  Administered 2013-06-25 – 2013-06-29 (×6): 5 mg via ORAL
  Filled 2013-06-23 (×6): qty 1

## 2013-06-23 MED ORDER — ACETAMINOPHEN 325 MG PO TABS
650.0000 mg | ORAL_TABLET | ORAL | Status: DC | PRN
  Administered 2013-06-25 – 2013-06-29 (×5): 650 mg via ORAL
  Filled 2013-06-23 (×5): qty 2

## 2013-06-23 MED ORDER — CALCIUM CARBONATE ANTACID 500 MG PO CHEW
2.0000 | CHEWABLE_TABLET | ORAL | Status: DC | PRN
  Administered 2013-06-26 – 2013-06-28 (×2): 400 mg via ORAL
  Filled 2013-06-23 (×3): qty 1

## 2013-06-23 MED ORDER — PROMETHAZINE HCL 25 MG/ML IJ SOLN
12.5000 mg | Freq: Four times a day (QID) | INTRAMUSCULAR | Status: DC | PRN
  Administered 2013-06-23 – 2013-06-25 (×3): 12.5 mg via INTRAVENOUS
  Filled 2013-06-23 (×4): qty 1

## 2013-06-23 MED ORDER — BETAMETHASONE SOD PHOS & ACET 6 (3-3) MG/ML IJ SUSP
12.0000 mg | Freq: Once | INTRAMUSCULAR | Status: AC
  Administered 2013-06-23: 12 mg via INTRAMUSCULAR
  Filled 2013-06-23: qty 2

## 2013-06-23 MED ORDER — VALACYCLOVIR HCL 500 MG PO TABS: 500.0000 mg | ORAL_TABLET | Freq: Every day | ORAL | Status: DC | PRN

## 2013-06-23 MED ORDER — BETAMETHASONE SOD PHOS & ACET 6 (3-3) MG/ML IJ SUSP
12.0000 mg | INTRAMUSCULAR | Status: AC
  Administered 2013-06-24: 12 mg via INTRAMUSCULAR
  Filled 2013-06-23: qty 2

## 2013-06-23 MED ORDER — BUDESONIDE-FORMOTEROL FUMARATE 80-4.5 MCG/ACT IN AERO: 2.0000 | INHALATION_SPRAY | Freq: Two times a day (BID) | RESPIRATORY_TRACT | Status: DC | PRN

## 2013-06-23 MED ORDER — PRENATAL MULTIVITAMIN CH
1.0000 | ORAL_TABLET | Freq: Every day | ORAL | Status: DC
  Administered 2013-06-24 – 2013-06-30 (×7): 1 via ORAL
  Filled 2013-06-23 (×7): qty 1

## 2013-06-23 MED ORDER — LIDOCAINE 5 % EX PTCH
1.0000 | MEDICATED_PATCH | CUTANEOUS | Status: DC
  Administered 2013-06-23 – 2013-06-30 (×8): 1 via TRANSDERMAL
  Filled 2013-06-23 (×8): qty 1

## 2013-06-23 MED ORDER — BUTORPHANOL TARTRATE 1 MG/ML IJ SOLN
2.0000 mg | INTRAMUSCULAR | Status: DC | PRN
  Administered 2013-06-23 – 2013-06-25 (×6): 2 mg via INTRAVENOUS
  Filled 2013-06-23 (×6): qty 2

## 2013-06-23 MED ORDER — ALBUTEROL SULFATE HFA 108 (90 BASE) MCG/ACT IN AERS: 2.0000 | INHALATION_SPRAY | Freq: Four times a day (QID) | RESPIRATORY_TRACT | Status: DC | PRN

## 2013-06-23 NOTE — Progress Notes (Signed)
Amy Pham is a 23 y.o. [phone] at 274w3d by LMP admitted for abruption  Subjective: Pt rec'd a dose of stadol, is more comfortable, decreased vaginal bleeding, desires diet. Also requests nicotene patch  Objective: BP 107/62  Pulse 91  Temp(Src) 98.3 F (36.8 C) (Oral)  Resp 16  Ht 5\' 7"  (1.702 m)  Wt 72.576 kg (160 lb)  BMI 25.05 kg/m2  LMP 09/03/2012 I/O last 3 completed shifts: In: -  Out: 300 [Urine:300]    FHT:  FHR: 120 bpm, variability: moderate,  accelerations:  Present,  decelerations:  Absent UC:   regular, every 3-4 minutes SVE:   Dilation: Fingertip Effacement (%): Thick Station: -3 Exam by:: Amy Ybanez MD  Labs: Lab Results  Component Value Date   WBC 13.8* 06/23/2013   HGB 12.0 06/23/2013   HCT 34.6* 06/23/2013   MCV 94.0 06/23/2013   PLT 114* 06/23/2013    Assessment / Plan: Amy Pham is a 23 y.o. [id number] at 5174w3d for abruption. Pt is stable. Decreased bleeding.   Labor: no cervical change Fetal Wellbeing:  Category I Pain Control:  stadol I/D:  n/a Ok to eat. nicotene patch.   Amy LentODOM, Amy Pham Amy Pham 06/23/2013, 7:08 PM
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
## 2013-06-23 NOTE — H&P (Signed)
Attestation of Attending Supervision of Advanced Practitioner (CNM/NP): Evaluation and management procedures were performed by the Advanced Practitioner under my supervision and collaboration.  I have reviewed the Advanced Practitioner's note and chart, and I agree with the management and plan.  Shanyia Stines 06/23/2013 10:49 PM

## 2013-06-24 DIAGNOSIS — O4700 False labor before 37 completed weeks of gestation, unspecified trimester: Secondary | ICD-10-CM

## 2013-06-24 DIAGNOSIS — D696 Thrombocytopenia, unspecified: Secondary | ICD-10-CM

## 2013-06-24 DIAGNOSIS — O99340 Other mental disorders complicating pregnancy, unspecified trimester: Secondary | ICD-10-CM

## 2013-06-24 DIAGNOSIS — O4590 Premature separation of placenta, unspecified, unspecified trimester: Secondary | ICD-10-CM

## 2013-06-24 LAB — CBC
HCT: 33.5 % — ABNORMAL LOW (ref 36.0–46.0)
Hemoglobin: 11.5 g/dL — ABNORMAL LOW (ref 12.0–15.0)
MCH: 31.9 pg (ref 26.0–34.0)
MCHC: 34.3 g/dL (ref 30.0–36.0)
MCV: 93.1 fL (ref 78.0–100.0)
PLATELETS: 123 10*3/uL — AB (ref 150–400)
RBC: 3.6 MIL/uL — ABNORMAL LOW (ref 3.87–5.11)
RDW: 12.8 % (ref 11.5–15.5)
WBC: 12.2 10*3/uL — ABNORMAL HIGH (ref 4.0–10.5)

## 2013-06-24 MED ORDER — INDOMETHACIN 25 MG PO CAPS: 25.0000 mg | ORAL_CAPSULE | Freq: Four times a day (QID) | ORAL | Status: DC

## 2013-06-24 MED ORDER — INDOMETHACIN 50 MG RE SUPP
50.0000 mg | Freq: Once | RECTAL | Status: DC
Start: 2013-06-24 — End: 2013-06-24

## 2013-06-24 NOTE — Progress Notes (Signed)
Patient ID: Amy Pham, female   DOB: 02/08/1991, 23 y.o.   MRN: [id number] FACULTY PRACTICE ANTEPARTUM(COMPREHENSIVE) NOTE  Amy Pham is a 23 y.o. [phone] at 3174w4d  who is admitted for placental abruption.    Fetal presentation is cephalic. Length of Stay:  1  Days  Date of admission:06/23/2013  Subjective: Patient doing well, reports significant improvement in vaginal bleeding. Describes some cramping pain but no contractions. Overall, significant improvement in comparison to admission Patient reports the fetal movement as active. Patient reports uterine contraction  activity as cramping occasionally. Patient reports  vaginal bleeding as scant staining of dark blood Patient describes fluid per vagina as None.  Vitals:  Blood pressure 94/47, pulse 74, temperature 98 F (36.7 C), temperature source Oral, resp. rate 16, height 5\' 7"  (1.702 m), weight 160 lb (72.576 kg), last menstrual period 09/03/2012. Filed Vitals:   06/23/13 1917 06/23/13 1933 06/24/13 0023 06/24/13 0638  BP: 102/65 108/72 89/51 94/47   Pulse: 79 83 95 74  Temp:  98.6 F (37 C)  98 F (36.7 C)  TempSrc:  Oral  Oral  Resp: 16 18  16   Height:      Weight:       Physical Examination:  General appearance - alert, well appearing, and in no distress Fundal Height:  size equals dates Pelvic Exam:  examination not indicated Cervical Exam: Not evaluated.  Extremities: extremities normal, atraumatic, no cyanosis or edema and Homans sign is negative, no sign of DVT with DTRs 2+ bilaterally Membranes:intact  Fetal Monitoring:  Baseline: 125 bpm, Variability: Good {> 6 bpm), Accelerations: Reactive, Decelerations: Absent and Toco: irritability   reactive  Labs:  Results for orders placed during the hospital encounter of 06/23/13 (from the past 24 hour(s))  TYPE AND SCREEN   Collection Time    06/23/13  2:15 PM      Result Value Range   ABO/RH(D) O POS     Antibody Screen NEG     Sample Expiration  06/26/2013    ABO/RH   Collection Time    06/23/13  2:15 PM      Result Value Range   ABO/RH(D) O POS    CBC   Collection Time    06/23/13  2:16 PM      Result Value Range   WBC 13.8 (*) 4.0 - 10.5 K/uL   RBC 3.68 (*) 3.87 - 5.11 MIL/uL   Hemoglobin 12.0  12.0 - 15.0 g/dL   HCT 13.034.6 (*) 86.536.0 - 78.446.0 %   MCV 94.0  78.0 - 100.0 fL   MCH 32.6  26.0 - 34.0 pg   MCHC 34.7  30.0 - 36.0 g/dL   RDW 69.613.1  29.511.5 - 28.415.5 %   Platelets 114 (*) 150 - 400 K/uL  PROTIME-INR   Collection Time    06/23/13  2:16 PM      Result Value Range   Prothrombin Time 14.2  11.6 - 15.2 seconds   INR 1.12  0.00 - 1.49  APTT   Collection Time    06/23/13  2:16 PM      Result Value Range   aPTT 31  24 - 37 seconds  URINE RAPID DRUG SCREEN (HOSP PERFORMED)   Collection Time    06/23/13  3:15 PM      Result Value Range   Opiates NONE DETECTED  NONE DETECTED   Cocaine NONE DETECTED  NONE DETECTED   Benzodiazepines NONE DETECTED  NONE DETECTED   Amphetamines NONE  DETECTED  NONE DETECTED   Tetrahydrocannabinol NONE DETECTED  NONE DETECTED   Barbiturates NONE DETECTED  NONE DETECTED  URINALYSIS, ROUTINE W REFLEX MICROSCOPIC   Collection Time    06/23/13  3:15 PM      Result Value Range   Color, Urine YELLOW  YELLOW   APPearance CLEAR  CLEAR   Specific Gravity, Urine 1.015  1.005 - 1.030   pH 6.5  5.0 - 8.0   Glucose, UA NEGATIVE  NEGATIVE mg/dL   Hgb urine dipstick TRACE (*) NEGATIVE   Bilirubin Urine NEGATIVE  NEGATIVE   Ketones, ur 15 (*) NEGATIVE mg/dL   Protein, ur NEGATIVE  NEGATIVE mg/dL   Urobilinogen, UA 0.2  0.0 - 1.0 mg/dL   Nitrite NEGATIVE  NEGATIVE   Leukocytes, UA NEGATIVE  NEGATIVE  URINE MICROSCOPIC-ADD ON   Collection Time    06/23/13  3:15 PM      Result Value Range   Squamous Epithelial / LPF FEW (*) RARE   WBC, UA 0-2  <3 WBC/hpf   RBC / HPF 0-2  <3 RBC/hpf   Bacteria, UA RARE  RARE  CBC   Collection Time    06/24/13  5:17 AM      Result Value Range   WBC 12.2 (*) 4.0 -  10.5 K/uL   RBC 3.60 (*) 3.87 - 5.11 MIL/uL   Hemoglobin 11.5 (*) 12.0 - 15.0 g/dL   HCT 60.4 (*) 54.0 - 98.1 %   MCV 93.1  78.0 - 100.0 fL   MCH 31.9  26.0 - 34.0 pg   MCHC 34.3  30.0 - 36.0 g/dL   RDW 19.1  47.8 - 29.5 %   Platelets 123 (*) 150 - 400 K/uL    Imaging Studies:    06/23/2013 US demonstrates a BPP 8/8 with marginal abruption of posterior placenta  Medications:  Scheduled . betamethasone acetate-betamethasone sodium phosphate  12 mg Intramuscular Q24H  . docusate sodium  100 mg Oral Daily  . lidocaine  1 patch Transdermal Q24H  . nicotine  21 mg Transdermal Daily  . prenatal multivitamin  1 tablet Oral Q1200   I have reviewed the patient's current medications.  ASSESSMENT: Patient Active Problem List   Diagnosis Date Noted  . Placental abruption in third trimester 06/23/2013  . Elevated white blood cell count 05/17/2013  . Rash 04/13/2013  . Previous cesarean delivery, antepartum condition or complication 02/16/2013  . 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 Asthma, moderate persistent 02/16/2013  . Tobacco abuse 02/16/2013    PLAN: - Patient to receive second dose of betamethasone today - BPP next Friday - Continue inpatient observation - Continue current antepartum care  Jini Horiuchi 06/24/2013,7:00 AM

## 2013-06-25 DIAGNOSIS — D696 Thrombocytopenia, unspecified: Secondary | ICD-10-CM | POA: Diagnosis present

## 2013-06-25 DIAGNOSIS — O99113 Other diseases of the blood and blood-forming organs and certain disorders involving the immune mechanism complicating pregnancy, third trimester: Secondary | ICD-10-CM

## 2013-06-25 MED ORDER — ONDANSETRON 4 MG PO TBDP
8.0000 mg | ORAL_TABLET | Freq: Three times a day (TID) | ORAL | Status: DC | PRN
  Administered 2013-06-25: 8 mg via ORAL
  Filled 2013-06-25: qty 2

## 2013-06-25 MED ORDER — FLUCONAZOLE 150 MG PO TABS
150.0000 mg | ORAL_TABLET | Freq: Once | ORAL | Status: AC
  Administered 2013-06-25: 150 mg via ORAL
  Filled 2013-06-25: qty 1

## 2013-06-25 MED ORDER — BUDESONIDE-FORMOTEROL FUMARATE 80-4.5 MCG/ACT IN AERO
2.0000 | INHALATION_SPRAY | Freq: Two times a day (BID) | RESPIRATORY_TRACT | Status: DC
  Administered 2013-06-25 – 2013-06-30 (×10): 2 via RESPIRATORY_TRACT
  Filled 2013-06-25: qty 6.9

## 2013-06-25 NOTE — Progress Notes (Signed)
At 0400 06/25/13, pt had a 5 minute prolonged decel not correlated with any uterine activity. Patient was asleep in semi-fowlers, had patient turn to left side & 500cc fluid bolus initiated. FHR returned to baseline reactive & reassuring.

## 2013-06-25 NOTE — Progress Notes (Signed)
Patient ID: Amy Pham, female   DOB: 10/06/1990, 23 y.o.   MRN: [id number] FACULTY PRACTICE ANTEPARTUM(COMPREHENSIVE) NOTE  Amy KindredMeredith B Pham is a 23 y.o. [id number] at 6481w5d by best clinical estimate who is admitted for vaginal bleeding/maringal abruption.   Fetal presentation is cephalic. Length of Stay:  2  Days  Subjective: Feeling well--no active bleeding Patient reports the fetal movement as active. Patient reports uterine contraction  activity as none. Patient reports  vaginal bleeding as scant staining. Patient describes fluid per vagina as None.  Vitals:  Blood pressure 99/52, pulse 86, temperature 98.6 F (37 C), temperature source Oral, resp. rate 18, height 5\' 7"  (1.702 m), weight 160 lb (72.576 kg), last menstrual period 09/03/2012, SpO2 98.00%. Physical Examination:  General appearance - alert, well appearing, and in no distress Abdomen - soft, nontender, nondistended, no masses or organomegaly, gravid Fundal Height:  size equals dates Extremities: extremities normal, atraumatic, no cyanosis or edema  Membranes:intact  Fetal Monitoring:  Baseline: 120 bpm, Variability: Good {> 6 bpm), Accelerations: Reactive and Decelerations: One 4 min bradycardia at 4 am, has recovered and looks good  Labs:  No results found for this or any previous visit (from the past 24 hour(s)).   Medications:  Scheduled . docusate sodium  100 mg Oral Daily  . lidocaine  1 patch Transdermal Q24H  . nicotine  21 mg Transdermal Daily  . prenatal multivitamin  1 tablet Oral Q1200   I have reviewed the patient's current medications.  ASSESSMENT: Patient Active Problem List   Diagnosis Date Noted  . Previous cesarean delivery, antepartum condition or complication 02/16/2013    Priority: High  . Mental disorders of mother, antepartum(648.43) 02/16/2013    Priority: Medium  . 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 Gestational thrombocytopenia in third  trimester 06/25/2013  . Placental abruption in third trimester 06/23/2013  . Elevated white blood cell count 05/17/2013  . Rash 04/13/2013  . Asthma, moderate persistent 02/16/2013  . Tobacco abuse 02/16/2013    PLAN: Continue inpt. Monitoring x 1 wk.  Will d/c IV today.  Maylea Soria S 06/25/2013,6:16 AM

## 2013-06-26 ENCOUNTER — Encounter: Payer: Managed Care, Other (non HMO) | Admitting: Obstetrics & Gynecology

## 2013-06-26 DIAGNOSIS — O4700 False labor before 37 completed weeks of gestation, unspecified trimester: Secondary | ICD-10-CM

## 2013-06-26 DIAGNOSIS — O99340 Other mental disorders complicating pregnancy, unspecified trimester: Secondary | ICD-10-CM

## 2013-06-26 DIAGNOSIS — O4590 Premature separation of placenta, unspecified, unspecified trimester: Principal | ICD-10-CM

## 2013-06-26 DIAGNOSIS — D696 Thrombocytopenia, unspecified: Secondary | ICD-10-CM

## 2013-06-26 MED ORDER — ONDANSETRON 4 MG PO TBDP: 4.0000 mg | ORAL_TABLET | Freq: Three times a day (TID) | ORAL | Status: DC | PRN

## 2013-06-26 MED ORDER — SODIUM CHLORIDE 0.9 % IJ SOLN
3.0000 mL | Freq: Two times a day (BID) | INTRAMUSCULAR | Status: DC
  Administered 2013-06-26 – 2013-06-28 (×5): 3 mL via INTRAVENOUS

## 2013-06-26 NOTE — Progress Notes (Signed)
Patient ID: Amy Pham, female   DOB: 04/12/1991, 23 y.o.   MRN: [id number] FACULTY PRACTICE ANTEPARTUM(COMPREHENSIVE) NOTE  Amy Pham is a 23 y.o. [id number] at 3158w6d  who is admitted for abruption.   Length of Stay:  3  Days  Subjective: Pt has right sided pain but is sleeping while I am speaking to her.  Pt had pain last night but didn't require pain meds other than Tylenol Patient reports the fetal movement as active. Patient reports uterine contraction  activity as none. Patient reports  vaginal bleeding as scant staining. Patient describes fluid per vagina as None.  Vitals:  Blood pressure 101/55, pulse 81, temperature 98.2 F (36.8 C), temperature source Oral, resp. rate 22, height 5\' 7"  (1.702 m), weight 160 lb (72.576 kg), last menstrual period 09/03/2012, SpO2 98.00%. Physical Examination:  General appearance - alert, well appearing, and in no distress Abdomen - tenderness noted on right side of abdomen--seems to be over the fetal parts. Extremities - Homan's sign negative bilaterally  Fetal Monitoring:  Baseline: 120 bpm, Variability: Good {> 6 bpm), Accelerations: Reactive and Decelerations: Absent  Labs:  No results found for this or any previous visit (from the past 24 hour(s)).   Medications:  Scheduled . budesonide-formoterol  2 puff Inhalation BID  . docusate sodium  100 mg Oral Daily  . lidocaine  1 patch Transdermal Q24H  . nicotine  21 mg Transdermal Daily  . prenatal multivitamin  1 tablet Oral Q1200   I have reviewed the patient's current medications.  ASSESSMENT: Patient Active Problem List   Diagnosis Date Noted  . Gestational thrombocytopenia in third trimester 06/25/2013  . Placental abruption in third trimester 06/23/2013  . Elevated white blood cell count 05/17/2013  . Rash 04/13/2013  . Previous cesarean delivery, antepartum condition or complication 02/16/2013  . 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 Drug  dependence, antepartum(648.33) 02/16/2013  . Asthma, moderate persistent 02/16/2013  . Tobacco abuse 02/16/2013    PLAN: Amy Pham is a 23 y.o. [id number] at 2458w6d  who is admitted for abruption.   #pregnancy--bleeding stable #Fetal status--reactive NST #LOS planned for one week  Amy Pham. 06/26/2013,7:52 AM

## 2013-06-26 NOTE — Progress Notes (Signed)
Ur chart review completed.  

## 2013-06-27 NOTE — Progress Notes (Signed)
Amy Pham is a 23 y.o. [phone] at 34 weeks who is admitted for abruption.  Length of Stay: 4 Days  Subjective:  Pt has right sided pain but is sleeping while I am speaking to her. Pt had pain last night but didn't require pain meds other than Tylenol  Patient reports the fetal movement as active.  Patient reports uterine contraction activity as none.  Patient reports vaginal bleeding as scant staining.  Patient describes fluid per vagina as None.  Vitals: Blood pressure 101/55, pulse 81, temperature 98.2 F (36.8 C), temperature source Oral, resp. rate 22, height 5\' 7"  (1.702 m), weight 160 lb (72.576 kg), last menstrual period 09/03/2012, SpO2 98.00%.  Physical Examination:  General appearance - alert, well appearing, and in no distress  Abdomen - tenderness noted on right side of abdomen--seems to be over the fetal parts.  Extremities - Homan's sign negative bilaterally  Fetal Monitoring: Baseline: 120 bpm, Variability: Good {> 6 bpm), Accelerations: Reactive and Decelerations: Absent  Labs:  No results found for this or any previous visit (from the past 24 hour(s)).  Medications: Scheduled  .  budesonide-formoterol  2 puff  Inhalation  BID   .  docusate sodium  100 mg  Oral  Daily   .  lidocaine  1 patch  Transdermal  Q24H   .  nicotine  21 mg  Transdermal  Daily   .  prenatal multivitamin  1 tablet  Oral  Q1200   I have reviewed the patient's current medications.  ASSESSMENT:  Patient Active Problem List    Diagnosis  Date Noted   .  Gestational thrombocytopenia in third trimester  06/25/2013   .  Placental abruption in third trimester  06/23/2013   .  Elevated white blood cell count  05/17/2013   .  Rash  04/13/2013   .  Previous cesarean delivery, antepartum condition or complication  02/16/2013   .  Mental disorders of mother, antepartum(648.43)  02/16/2013   .  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  Asthma, moderate persistent  02/16/2013   .   Tobacco abuse  02/16/2013   PLAN:  Amy Pham is a 23 y.o. [id number] at 34 weeks who is admitted for abruption.  #pregnancy--bleeding stable  #Fetal status--reactive NST  #LOS planned for one week

## 2013-06-28 MED ORDER — BUTALBITAL-APAP-CAFFEINE 50-325-40 MG PO TABS
1.0000 | ORAL_TABLET | ORAL | Status: DC | PRN
  Administered 2013-06-28 – 2013-06-30 (×3): 1 via ORAL
  Filled 2013-06-28 (×3): qty 1

## 2013-06-28 NOTE — Progress Notes (Signed)
Patient ID: Amy Pham, female   DOB: 06/08/1991, 23 y.o.   MRN: [id number] FACULTY PRACTICE ANTEPARTUM(COMPREHENSIVE) NOTE  Amy Pham is a 23 y.o. [id number] at 2483w1d  who is admitted for abruption.   Length of Stay:  5  Days  Subjective: Patient reports minimal vaginal bleeding of dark blood. She is complaining of a headache this morning. Patient reports history of migraine headaches Patient reports the fetal movement as active. Patient reports uterine contraction  activity as none. Patient reports  vaginal bleeding as scant staining. Patient describes fluid per vagina as None.  Vitals:  Blood pressure 97/53, pulse 101, temperature 98.9 F (37.2 C), temperature source Oral, resp. rate 18, height 5\' 7"  (1.702 m), weight 160 lb (72.576 kg), last menstrual period 09/03/2012, SpO2 98.00%. Physical Examination:  General appearance - alert, well appearing, and in no distress Abdomen - soft, gravid, non tender. Extremities - Homan's sign negative bilaterally  Fetal Monitoring:  Baseline: 140 bpm, Variability: Good {> 6 bpm), Accelerations: Reactive, Decelerations: Absent and Toco: no contractions  Labs:  No results found for this or any previous visit (from the past 24 hour(s)).   Medications:  Scheduled . budesonide-formoterol  2 puff Inhalation BID  . docusate sodium  100 mg Oral Daily  . lidocaine  1 patch Transdermal Q24H  . nicotine  21 mg Transdermal Daily  . prenatal multivitamin  1 tablet Oral Q1200  . sodium chloride  3 mL Intravenous Q12H   I have reviewed the patient's current medications.  ASSESSMENT: Patient Active Problem List   Diagnosis Date Noted  . Gestational thrombocytopenia in third trimester 06/25/2013  . Placental abruption in third trimester 06/23/2013  . Elevated white blood cell count 05/17/2013  . Rash 04/13/2013  . Previous cesarean delivery, antepartum condition or complication 02/16/2013  . Mental disorders of mother,  antepartum(648.43) 02/16/2013  . 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 Asthma, moderate persistent 02/16/2013  . Tobacco abuse 02/16/2013    PLAN: Amy Pham is a 23 y.o. [id number] at 5383w1d  who is admitted for abruption.   1-pregnancy--bleeding stable 2- Fetal status--reactive NST 3- Discharge planning on Friday if she remains without vaginal bleeding 4- Fioricet for headache  Trentan Trippe 06/28/2013,6:11 AM

## 2013-06-29 NOTE — Progress Notes (Signed)
Dr Emelda FearFerguson at bedside to discuss POC.  Pt may ambulate.  Continue to monitor for new bleeding.  D/C tomorrow if no further bleeding.

## 2013-06-29 NOTE — Progress Notes (Signed)
UR completed 

## 2013-06-29 NOTE — Progress Notes (Signed)
FACULTY PRACTICE ANTEPARTUM(COMPREHENSIVE) NOTE  Amy Pham is a 23 y.o. [phone] at 9586w2d by early ultrasound who is admitted for bleeding , who has had no bleeding since admission 06/23/13.   Fetal presentation is unsure. Length of Stay:  6  Days  Subjective: No pain, discomfort bleeding , tenderness Patient reports the fetal movement as active. Patient reports uterine contraction  activity as none. Patient reports  vaginal bleeding as none. Patient describes fluid per vagina as None.  Vitals:  Blood pressure 120/63, pulse 110, temperature 98 F (36.7 C), temperature source Oral, resp. rate 18, height 5\' 7"  (1.702 m), weight 72.349 kg (159 lb 8 oz), last menstrual period 09/03/2012, SpO2 98.00%. Physical Examination:  General appearance - alert, well appearing, and in no distress Heart - normal rate and regular rhythm Abdomen - soft, nontender, nondistended Fundal Height:  size equals dates Cervical Exam: Not evaluated. . Extremities: extremities normal, atraumatic, no cyanosis or edema and Homans sign is negative, no sign of DVT with DTRs 2+ bilaterally Membranes:intact  Fetal Monitoring:  Daily 30 minute monitoring normal  Labs:  No results found for this or any previous visit (from the past 24 hour(s)).  Imaging Studies:     Currently EPIC will not allow sonographic studies to automatically populate into notes.  In the meantime, copy and paste results into note or free text.  Medications:  Scheduled . budesonide-formoterol  2 puff Inhalation BID  . docusate sodium  100 mg Oral Daily  . lidocaine  1 patch Transdermal Q24H  . nicotine  21 mg Transdermal Daily  . prenatal multivitamin  1 tablet Oral Q1200  . sodium chloride  3 mL Intravenous Q12H   I have reviewed the patient's current medications.  ASSESSMENT: Patient Active Problem List   Diagnosis Date Noted  . Gestational thrombocytopenia in third trimester 06/25/2013  . Placental abruption in third trimester  06/23/2013  . Elevated white blood cell count 05/17/2013  . Rash 04/13/2013  . Previous cesarean delivery, antepartum condition or complication 02/16/2013  . 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 Asthma, moderate persistent 02/16/2013  . Tobacco abuse 02/16/2013    PLAN: INcrease ambulation, may have to remove IV due to tenderness at iv site, will leave out if removed D/C home in am if remains stable.  Kynsley Whitehouse V 06/29/2013,9:19 AM

## 2013-06-29 NOTE — Progress Notes (Signed)
Pt desires to sleep.  Will call out when ready for medications and FHM.  Call bell within reach.

## 2013-06-30 MED ORDER — ONDANSETRON 4 MG PO TBDP: 4.0000 mg | ORAL_TABLET | Freq: Three times a day (TID) | ORAL | Status: DC | PRN

## 2013-06-30 MED ORDER — BUTALBITAL-APAP-CAFFEINE 50-325-40 MG PO TABS: 1.0000 | ORAL_TABLET | ORAL | Status: DC | PRN

## 2013-06-30 MED ORDER — PRENATAL MULTIVITAMIN CH: 1.0000 | ORAL_TABLET | Freq: Every day | ORAL | Status: DC

## 2013-06-30 MED ORDER — NICOTINE 21 MG/24HR TD PT24: 21.0000 mg | MEDICATED_PATCH | Freq: Every day | TRANSDERMAL | Status: DC

## 2013-06-30 NOTE — Discharge Summary (Signed)
Antenatal Physician Discharge Summary  Patient ID: Amy Pham MRN: [id number] DOB/AGE: 23/19/1992 23 y.o.  Admit date: 06/23/2013 Discharge date: 06/30/2013  Admission Diagnoses: bleeding in pregnancy; h/o substance abuse; h/o tobacco use  Discharge Diagnoses: same  Prenatal Procedures: NST and ultrasound  Intrapartum Procedures: Neonatology, Maternal Fetal Medicine   Significant Diagnostic Studies:  Results for orders placed during the hospital encounter of 06/23/13 (from the past 168 hour(s))  TYPE AND SCREEN   Collection Time    06/23/13  2:15 PM      Result Value Range   ABO/RH(D) O POS     Antibody Screen NEG     Sample Expiration 06/26/2013    ABO/RH   Collection Time    06/23/13  2:15 PM      Result Value Range   ABO/RH(D) O POS    CBC   Collection Time    06/23/13  2:16 PM      Result Value Range   WBC 13.8 (*) 4.0 - 10.5 K/uL   RBC 3.68 (*) 3.87 - 5.11 MIL/uL   Hemoglobin 12.0  12.0 - 15.0 g/dL   HCT 14.7 (*) 82.9 - 56.2 %   MCV 94.0  78.0 - 100.0 fL   MCH 32.6  26.0 - 34.0 pg   MCHC 34.7  30.0 - 36.0 g/dL   RDW 13.0  86.5 - 78.4 %   Platelets 114 (*) 150 - 400 K/uL  PROTIME-INR   Collection Time    06/23/13  2:16 PM      Result Value Range   Prothrombin Time 14.2  11.6 - 15.2 seconds   INR 1.12  0.00 - 1.49  APTT   Collection Time    06/23/13  2:16 PM      Result Value Range   aPTT 31  24 - 37 seconds  URINE RAPID DRUG SCREEN (HOSP PERFORMED)   Collection Time    06/23/13  3:15 PM      Result Value Range   Opiates NONE DETECTED  NONE DETECTED   Cocaine NONE DETECTED  NONE DETECTED   Benzodiazepines NONE DETECTED  NONE DETECTED   Amphetamines NONE DETECTED  NONE DETECTED   Tetrahydrocannabinol NONE DETECTED  NONE DETECTED   Barbiturates NONE DETECTED  NONE DETECTED  URINALYSIS, ROUTINE W REFLEX MICROSCOPIC   Collection Time    06/23/13  3:15 PM      Result Value Range   Color, Urine YELLOW  YELLOW   APPearance CLEAR  CLEAR   Specific Gravity, Urine 1.015  1.005 - 1.030   pH 6.5  5.0 - 8.0   Glucose, UA NEGATIVE  NEGATIVE mg/dL   Hgb urine dipstick TRACE (*) NEGATIVE   Bilirubin Urine NEGATIVE  NEGATIVE   Ketones, ur 15 (*) NEGATIVE mg/dL   Protein, ur NEGATIVE  NEGATIVE mg/dL   Urobilinogen, UA 0.2  0.0 - 1.0 mg/dL   Nitrite NEGATIVE  NEGATIVE   Leukocytes, UA NEGATIVE  NEGATIVE  URINE MICROSCOPIC-ADD ON   Collection Time    06/23/13  3:15 PM      Result Value Range   Squamous Epithelial / LPF FEW (*) RARE   WBC, UA 0-2  <3 WBC/hpf   RBC / HPF 0-2  <3 RBC/hpf   Bacteria, UA RARE  RARE  CBC   Collection Time    06/24/13  5:17 AM      Result Value Range   WBC 12.2 (*) 4.0 - 10.5 K/uL   RBC 3.60 (*) 3.87 -  5.11 MIL/uL   Hemoglobin 11.5 (*) 12.0 - 15.0 g/dL   HCT 16.133.5 (*) 09.636.0 - 04.546.0 %   MCV 93.1  78.0 - 100.0 fL   MCH 31.9  26.0 - 34.0 pg   MCHC 34.3  30.0 - 36.0 g/dL   RDW 40.912.8  81.111.5 - 91.415.5 %   Platelets 123 (*) 150 - 400 K/uL    Treatments: IV hydration and steroids: Betamethasone  Hospital Course:  This is a 23 y.o. [id number] with IUP at 6080w3d admitted for vaginal bleeding in pregnancy.  She was observed for 1 week with no further bleeding.  No leaking of fluid.  She received Betamethasone x 2 doses.  She was observed, fetal heart rate monitoring remained reassuring, and she had no signs/symptoms of progressing preterm labor or other maternal-fetal concerns.   She was deemed stable for discharge to home with outpatient follow up. She has a h/o tobacco use and was on the Nicotine patch while in the hops.  She would like to continue that so that she can continue to not smoke at home.  Discharge Exam: BP 113/63  Pulse 97  Temp(Src) 97.9 F (36.6 C) (Oral)  Resp 18  Ht 5\' 7"  (1.702 m)  Wt 159 lb 8 oz (72.349 kg)  BMI 24.98 kg/m2  SpO2 98%  LMP 09/03/2012 General appearance: alert and no distress GI: soft, non-tender; bowel sounds normal; no masses,  no organomegaly and gravid FHR: Cat I;  reactive, no decels,   Toco: no ctx   Discharge Condition: good  Disposition: 01-Home or Self Care  Discharge Orders   Future Orders Complete By Expires   Discharge activity:  As directed    Scheduling Instructions:     No heavy lifting, no intercourse, no exercise   Discharge diet:  No restrictions  As directed    Do not have sex or do anything that might make you have an orgasm  As directed    Notify physician for a general feeling that "something is not right"  As directed    Notify physician for increase or change in vaginal discharge  As directed    Notify physician for intestinal cramps, with or without diarrhea, sometimes described as "gas pain"  As directed    Notify physician for leaking of fluid  As directed    Notify physician for low, dull backache, unrelieved by heat or Tylenol  As directed    Notify physician for menstrual like cramps  As directed    Notify physician for pelvic pressure  As directed    Notify physician for uterine contractions.  These may be painless and feel like the uterus is tightening or the baby is  "balling up"  As directed    Notify physician for vaginal bleeding  As directed    PRETERM LABOR:  Includes any of the follwing symptoms that occur between 20 - [redacted] weeks gestation.  If these symptoms are not stopped, preterm labor can result in preterm delivery, placing your baby at risk  As directed        Medication List         acetaminophen 325 MG tablet  Commonly known as:  TYLENOL  Take 975 mg by mouth 3 (three) times daily as needed for pain.     albuterol 108 (90 BASE) MCG/ACT inhaler  Commonly known as:  PROAIR HFA  Inhale 2 puffs into the lungs every 6 (six) hours as needed for wheezing.     budesonide-formoterol 80-4.5  MCG/ACT inhaler  Commonly known as:  SYMBICORT  Inhale 2 puffs into the lungs 2 (two) times daily as needed (for shortness of breath).     butalbital-acetaminophen-caffeine 50-325-40 MG per tablet  Commonly known as:   FIORICET, ESGIC  Take 1 tablet by mouth every 4 (four) hours as needed for headache.     lidocaine 5 %  Commonly known as:  LIDODERM  Place 1 patch onto the skin daily. Remove & Discard patch within 12 hours or as directed by MD     nicotine 21 mg/24hr patch  Commonly known as:  NICODERM CQ - dosed in mg/24 hours  Place 1 patch (21 mg total) onto the skin daily.     ondansetron 4 MG disintegrating tablet  Commonly known as:  ZOFRAN-ODT  Take 1 tablet (4 mg total) by mouth every 8 (eight) hours as needed for nausea or vomiting.     prenatal multivitamin Tabs tablet  Take 1 tablet by mouth daily.     prenatal multivitamin Tabs tablet  Take 1 tablet by mouth daily at 12 noon.     valACYclovir 500 MG tablet  Commonly known as:  VALTREX  Take 500 mg by mouth daily as needed (for outbreak).           Follow-up Information   Follow up with ARNOLD,JAMES, MD In 1 week.   Specialty:  Obstetrics and Gynecology   Contact information:   [address] [place] [address] [phone]       Signed: Willodean Rosenthal M.D. 06/30/2013, 7:59 AM

## 2013-06-30 NOTE — Plan of Care (Signed)
Problem: Phase I Progression Outcomes Goal: Spiritual Needs (Notify SW/CM or Social Work) Outcome: Not Met (add Reason) Pt has spiritual support and no need of Education officer, museum.

## 2013-06-30 NOTE — Progress Notes (Signed)
Pt. Is stable and ready to be discharged. All discharge instructions given and prescriptions given and reviewed. All questions answered. Pt. Cleared to walk out to car with mother. Will follow up with MD in 1 week.

## 2013-06-30 NOTE — Discharge Instructions (Signed)
Vaginal Bleeding During Pregnancy, Third Trimester °A small amount of bleeding (spotting) is relatively common in pregnancy. Sometimes bleeding may be "normal." Bleeding in the third trimester can be very serious for the mother and the baby, and should be reported to your caregiver right away. It is very important to follow your caregiver's instructions. °CAUSES °Possible causes of bleeding during the third trimester: °· The placenta may be partially covering or completely covering the opening to the cervix (placenta previa). °· The placenta may have separated from the uterus (abruption of the placenta). °· There may be an infection or growth on the cervix. °· You may be starting labor, called discharging of the mucus plug. °· The placenta may grow into the muscle layer of the uterus (placenta accreta). °DIAGNOSIS  °Your caregiver may do: °· Pelvic exam in the delivery room, called a double set up (being ready to do an emergency cesarean delivery, if necessary). °· Blood tests, to see if you are anemic (not having enough red blood cells). °· Ultrasound and fetal monitoring, to see if the baby is having problems. °· Ultrasound, to find out why you are bleeding. °TREATMENT  °· You may need to remain in the hospital. °· You may be given an IV (intravenous) while you are in the hospital. °· You may be put on strict bed rest. °· You may need a blood transfusion. °· You may be placed on oxygen. °· The baby may need to be delivered immediately. °HOME CARE INSTRUCTIONS  °· Your caregiver may order bed rest (getting up to go to the bathroom only). At this time, you may need to make arrangements for the care of children and for other responsibilities. °· Keep track of the number of pads you use each day and how soaked (saturated) they are. Write this down. °· Do not use tampons. Do not douche. °· Do not have sexual intercourse or any sexual activity that may cause an orgasm, until approved by your caregiver. °· Follow your  caregiver's advice about lifting, driving, physical and social activities. °· Eat a balanced and nutritious diet. °· Get plenty of rest and sleep. °· Do not drink alcohol or smoke. °SEEK IMMEDIATE MEDICAL CARE IF:  °· You experience severe cramps or pain in your back or belly (abdomen). °· You have an oral temperature above 102° F (38.9° C), not controlled by medicine. °· You develop chills. °· You have a gush of fluid from the vagina. °· You pass large clots or tissue. Save any tissue for your caregiver to inspect. °· Your bleeding increases or you become light-headed or weak. °· You pass out. °· You feel less movement or no movement of the baby. °Document Released: 08/29/2002 Document Revised: 08/31/2011 Document Reviewed: 05/06/2009 °Exit[employer]® Patient Information ©2014 [employer]. ° °

## 2013-07-04 ENCOUNTER — Encounter (INDEPENDENT_AMBULATORY_CARE_PROVIDER_SITE_OTHER): Payer: Medicaid Other | Admitting: Obstetrics & Gynecology

## 2013-07-04 DIAGNOSIS — Z3480 Encounter for supervision of other normal pregnancy, unspecified trimester: Secondary | ICD-10-CM

## 2013-07-11 ENCOUNTER — Encounter: Payer: Managed Care, Other (non HMO) | Admitting: Obstetrics & Gynecology

## 2013-07-11 NOTE — Progress Notes (Signed)
This encounter was created in error - please disregard.

## 2013-07-13 ENCOUNTER — Encounter: Payer: Self-pay | Admitting: *Deleted

## 2013-07-18 ENCOUNTER — Ambulatory Visit (INDEPENDENT_AMBULATORY_CARE_PROVIDER_SITE_OTHER): Payer: Managed Care, Other (non HMO) | Admitting: Obstetrics & Gynecology

## 2013-07-18 VITALS — BP 110/69 | Wt 168.0 lb

## 2013-07-18 DIAGNOSIS — Z3480 Encounter for supervision of other normal pregnancy, unspecified trimester: Secondary | ICD-10-CM

## 2013-07-18 DIAGNOSIS — Z3490 Encounter for supervision of normal pregnancy, unspecified, unspecified trimester: Secondary | ICD-10-CM

## 2013-07-18 NOTE — Progress Notes (Signed)
S<D, schedule US. Was in hosp 1/2-1/8 for bleeding, none now.

## 2013-07-18 NOTE — Patient Instructions (Signed)
Third Trimester of Pregnancy  The third trimester is from week 29 through week 42, months 7 through 9. The third trimester is a time when the fetus is growing rapidly. At the end of the ninth month, the fetus is about 20 inches in length and weighs 6 10 pounds.   BODY CHANGES  Your body goes through many changes during pregnancy. The changes vary from woman to woman.    Your weight will continue to increase. You can expect to gain 25 35 pounds (11 16 kg) by the end of the pregnancy.   You may begin to get stretch marks on your hips, abdomen, and breasts.   You may urinate more often because the fetus is moving lower into your pelvis and pressing on your bladder.   You may develop or continue to have heartburn as a result of your pregnancy.   You may develop constipation because certain hormones are causing the muscles that push waste through your intestines to slow down.   You may develop hemorrhoids or swollen, bulging veins (varicose veins).   You may have pelvic pain because of the weight gain and pregnancy hormones relaxing your joints between the bones in your pelvis. Back aches may result from over exertion of the muscles supporting your posture.   Your breasts will continue to grow and be tender. A yellow discharge may leak from your breasts called colostrum.   Your belly button may stick out.   You may feel short of breath because of your expanding uterus.   You may notice the fetus "dropping," or moving lower in your abdomen.   You may have a bloody mucus discharge. This usually occurs a few days to a week before labor begins.   Your cervix becomes thin and soft (effaced) near your due date.  WHAT TO EXPECT AT YOUR PRENATAL EXAMS   You will have prenatal exams every 2 weeks until week 36. Then, you will have weekly prenatal exams. During a routine prenatal visit:   You will be weighed to make sure you and the fetus are growing normally.   Your blood pressure is taken.   Your abdomen will be  measured to track your baby's growth.   The fetal heartbeat will be listened to.   Any test results from the previous visit will be discussed.   You may have a cervical check near your due date to see if you have effaced.  At around 36 weeks, your caregiver will check your cervix. At the same time, your caregiver will also perform a test on the secretions of the vaginal tissue. This test is to determine if a type of bacteria, Group B streptococcus, is present. Your caregiver will explain this further.  Your caregiver may ask you:   What your birth plan is.   How you are feeling.   If you are feeling the baby move.   If you have had any abnormal symptoms, such as leaking fluid, bleeding, severe headaches, or abdominal cramping.   If you have any questions.  Other tests or screenings that may be performed during your third trimester include:   Blood tests that check for low iron levels (anemia).   Fetal testing to check the health, activity level, and growth of the fetus. Testing is done if you have certain medical conditions or if there are problems during the pregnancy.  FALSE LABOR  You may feel small, irregular contractions that eventually go away. These are called Braxton Hicks contractions, or   false labor. Contractions may last for hours, days, or even weeks before true labor sets in. If contractions come at regular intervals, intensify, or become painful, it is best to be seen by your caregiver.   SIGNS OF LABOR    Menstrual-like cramps.   Contractions that are 5 minutes apart or less.   Contractions that start on the top of the uterus and spread down to the lower abdomen and back.   A sense of increased pelvic pressure or back pain.   A watery or bloody mucus discharge that comes from the vagina.  If you have any of these signs before the 37th week of pregnancy, call your caregiver right away. You need to go to the hospital to get checked immediately.  HOME CARE INSTRUCTIONS    Avoid all  smoking, herbs, alcohol, and unprescribed drugs. These chemicals affect the formation and growth of the baby.   Follow your caregiver's instructions regarding medicine use. There are medicines that are either safe or unsafe to take during pregnancy.   Exercise only as directed by your caregiver. Experiencing uterine cramps is a good sign to stop exercising.   Continue to eat regular, healthy meals.   Wear a good support bra for breast tenderness.   Do not use hot tubs, steam rooms, or saunas.   Wear your seat belt at all times when driving.   Avoid raw meat, uncooked cheese, cat litter boxes, and soil used by cats. These carry germs that can cause birth defects in the baby.   Take your prenatal vitamins.   Try taking a stool softener (if your caregiver approves) if you develop constipation. Eat more high-fiber foods, such as fresh vegetables or fruit and whole grains. Drink plenty of fluids to keep your urine clear or pale yellow.   Take warm sitz baths to soothe any pain or discomfort caused by hemorrhoids. Use hemorrhoid cream if your caregiver approves.   If you develop varicose veins, wear support hose. Elevate your feet for 15 minutes, 3 4 times a day. Limit salt in your diet.   Avoid heavy lifting, wear low heal shoes, and practice good posture.   Rest a lot with your legs elevated if you have leg cramps or low back pain.   Visit your dentist if you have not gone during your pregnancy. Use a soft toothbrush to brush your teeth and be gentle when you floss.   A sexual relationship may be continued unless your caregiver directs you otherwise.   Do not travel far distances unless it is absolutely necessary and only with the approval of your caregiver.   Take prenatal classes to understand, practice, and ask questions about the labor and delivery.   Make a trial run to the hospital.   Pack your hospital bag.   Prepare the baby's nursery.   Continue to go to all your prenatal visits as directed  by your caregiver.  SEEK MEDICAL CARE IF:   You are unsure if you are in labor or if your water has broken.   You have dizziness.   You have mild pelvic cramps, pelvic pressure, or nagging pain in your abdominal area.   You have persistent nausea, vomiting, or diarrhea.   You have a bad smelling vaginal discharge.   You have pain with urination.  SEEK IMMEDIATE MEDICAL CARE IF:    You have a fever.   You are leaking fluid from your vagina.   You have spotting or bleeding from your vagina.     You have severe abdominal cramping or pain.   You have rapid weight loss or gain.   You have shortness of breath with chest pain.   You notice sudden or extreme swelling of your face, hands, ankles, feet, or legs.   You have not felt your baby move in over an hour.   You have severe headaches that do not go away with medicine.   You have vision changes.  Document Released: 06/02/2001 Document Revised: 02/08/2013 Document Reviewed: 08/09/2012  [employer] Patient Information 2014 [employer].

## 2013-07-18 NOTE — Progress Notes (Signed)
P = 87 

## 2013-07-19 ENCOUNTER — Encounter (HOSPITAL_COMMUNITY): Payer: Self-pay

## 2013-07-19 ENCOUNTER — Inpatient Hospital Stay (HOSPITAL_COMMUNITY): Payer: Managed Care, Other (non HMO) | Admitting: Anesthesiology

## 2013-07-19 ENCOUNTER — Encounter (HOSPITAL_COMMUNITY): Payer: Managed Care, Other (non HMO) | Admitting: Anesthesiology

## 2013-07-19 ENCOUNTER — Encounter (HOSPITAL_COMMUNITY)
Admission: AD | Disposition: A | Discharge status: Home / Self Care | Payer: Self-pay | Source: Ambulatory Visit | Attending: Obstetrics & Gynecology

## 2013-07-19 ENCOUNTER — Inpatient Hospital Stay (HOSPITAL_COMMUNITY)
Admission: AD | Admit: 2013-07-19 | Discharge: 2013-07-22 | DRG: 765 | Disposition: A | Discharge status: Home / Self Care | Payer: Managed Care, Other (non HMO) | Source: Ambulatory Visit | Attending: Obstetrics & Gynecology | Admitting: Obstetrics & Gynecology

## 2013-07-19 DIAGNOSIS — O4590 Premature separation of placenta, unspecified, unspecified trimester: Secondary | ICD-10-CM

## 2013-07-19 DIAGNOSIS — A6000 Herpesviral infection of urogenital system, unspecified: Secondary | ICD-10-CM | POA: Diagnosis present

## 2013-07-19 DIAGNOSIS — O34219 Maternal care for unspecified type scar from previous cesarean delivery: Secondary | ICD-10-CM

## 2013-07-19 DIAGNOSIS — F3289 Other specified depressive episodes: Secondary | ICD-10-CM | POA: Diagnosis present

## 2013-07-19 DIAGNOSIS — O99344 Other mental disorders complicating childbirth: Secondary | ICD-10-CM | POA: Diagnosis present

## 2013-07-19 DIAGNOSIS — O98519 Other viral diseases complicating pregnancy, unspecified trimester: Secondary | ICD-10-CM | POA: Diagnosis present

## 2013-07-19 DIAGNOSIS — O99334 Smoking (tobacco) complicating childbirth: Secondary | ICD-10-CM | POA: Diagnosis present

## 2013-07-19 DIAGNOSIS — F329 Major depressive disorder, single episode, unspecified: Secondary | ICD-10-CM | POA: Diagnosis present

## 2013-07-19 HISTORY — DX: Unspecified asthma, uncomplicated: J45.909

## 2013-07-19 LAB — GC/CHLAMYDIA PROBE AMP
CT Probe RNA: NEGATIVE
GC Probe RNA: NEGATIVE

## 2013-07-19 LAB — CBC
HCT: 35.7 % — ABNORMAL LOW (ref 36.0–46.0)
Hemoglobin: 12.3 g/dL (ref 12.0–15.0)
MCH: 31.9 pg (ref 26.0–34.0)
MCHC: 34.5 g/dL (ref 30.0–36.0)
MCV: 92.7 fL (ref 78.0–100.0)
PLATELETS: 150 10*3/uL (ref 150–400)
RBC: 3.85 MIL/uL — AB (ref 3.87–5.11)
RDW: 12.9 % (ref 11.5–15.5)
WBC: 13.4 10*3/uL — AB (ref 4.0–10.5)

## 2013-07-19 LAB — PREPARE RBC (CROSSMATCH)

## 2013-07-19 SURGERY — Surgical Case
Anesthesia: Spinal | Site: Abdomen

## 2013-07-19 MED ORDER — KETOROLAC TROMETHAMINE 30 MG/ML IJ SOLN: 30.0000 mg | Freq: Four times a day (QID) | INTRAMUSCULAR | Status: AC | PRN

## 2013-07-19 MED ORDER — DIPHENHYDRAMINE HCL 50 MG/ML IJ SOLN: 12.5000 mg | INTRAMUSCULAR | Status: DC | PRN

## 2013-07-19 MED ORDER — SCOPOLAMINE 1 MG/3DAYS TD PT72
1.0000 | MEDICATED_PATCH | Freq: Once | TRANSDERMAL | Status: AC
  Administered 2013-07-19: 1.5 mg via TRANSDERMAL

## 2013-07-19 MED ORDER — DIPHENHYDRAMINE HCL 25 MG PO CAPS
25.0000 mg | ORAL_CAPSULE | ORAL | Status: DC | PRN
  Filled 2013-07-19: qty 1

## 2013-07-19 MED ORDER — IBUPROFEN 600 MG PO TABS
600.0000 mg | ORAL_TABLET | Freq: Four times a day (QID) | ORAL | Status: DC
  Administered 2013-07-19 – 2013-07-21 (×9): 600 mg via ORAL
  Filled 2013-07-19 (×10): qty 1

## 2013-07-19 MED ORDER — 0.9 % SODIUM CHLORIDE (POUR BTL) OPTIME
TOPICAL | Status: DC | PRN
  Administered 2013-07-19: 1000 mL

## 2013-07-19 MED ORDER — NICOTINE 21 MG/24HR TD PT24
21.0000 mg | MEDICATED_PATCH | Freq: Every day | TRANSDERMAL | Status: DC
  Administered 2013-07-19 – 2013-07-20 (×2): 21 mg via TRANSDERMAL
  Filled 2013-07-19 (×3): qty 1

## 2013-07-19 MED ORDER — MORPHINE SULFATE (PF) 0.5 MG/ML IJ SOLN
INTRAMUSCULAR | Status: DC | PRN
  Administered 2013-07-19: .15 mg via INTRATHECAL

## 2013-07-19 MED ORDER — LANOLIN HYDROUS EX OINT: 1.0000 "application " | TOPICAL_OINTMENT | CUTANEOUS | Status: DC | PRN

## 2013-07-19 MED ORDER — DIBUCAINE 1 % RE OINT: 1.0000 "application " | TOPICAL_OINTMENT | RECTAL | Status: DC | PRN

## 2013-07-19 MED ORDER — NALBUPHINE HCL 10 MG/ML IJ SOLN: 5.0000 mg | INTRAMUSCULAR | Status: DC | PRN

## 2013-07-19 MED ORDER — PROMETHAZINE HCL 25 MG/ML IJ SOLN: 6.2500 mg | INTRAMUSCULAR | Status: DC | PRN

## 2013-07-19 MED ORDER — LACTATED RINGERS IV SOLN: INTRAVENOUS | Status: DC | PRN

## 2013-07-19 MED ORDER — KETOROLAC TROMETHAMINE 30 MG/ML IJ SOLN
30.0000 mg | Freq: Four times a day (QID) | INTRAMUSCULAR | Status: AC | PRN
  Administered 2013-07-19: 30 mg via INTRAVENOUS

## 2013-07-19 MED ORDER — SODIUM CHLORIDE 0.9 % IJ SOLN: 3.0000 mL | INTRAMUSCULAR | Status: DC | PRN

## 2013-07-19 MED ORDER — SIMETHICONE 80 MG PO CHEW
80.0000 mg | CHEWABLE_TABLET | Freq: Three times a day (TID) | ORAL | Status: DC
  Administered 2013-07-19 – 2013-07-22 (×9): 80 mg via ORAL
  Filled 2013-07-19 (×7): qty 1

## 2013-07-19 MED ORDER — PHENYLEPHRINE 8 MG IN D5W 100 ML (0.08MG/ML) PREMIX OPTIME
INJECTION | INTRAVENOUS | Status: DC | PRN
  Administered 2013-07-19: 60 ug/min via INTRAVENOUS

## 2013-07-19 MED ORDER — CITRIC ACID-SODIUM CITRATE 334-500 MG/5ML PO SOLN
ORAL | Status: AC
  Administered 2013-07-19: 30 mL
  Filled 2013-07-19: qty 15

## 2013-07-19 MED ORDER — DIPHENHYDRAMINE HCL 25 MG PO CAPS
25.0000 mg | ORAL_CAPSULE | Freq: Four times a day (QID) | ORAL | Status: DC | PRN
Start: 2013-07-19 — End: 2013-07-22

## 2013-07-19 MED ORDER — DEXAMETHASONE SODIUM PHOSPHATE 10 MG/ML IJ SOLN
INTRAMUSCULAR | Status: DC | PRN
  Administered 2013-07-19: 4 mg via INTRAVENOUS

## 2013-07-19 MED ORDER — LACTATED RINGERS IV SOLN
INTRAVENOUS | Status: DC
  Administered 2013-07-19 (×2): via INTRAVENOUS

## 2013-07-19 MED ORDER — SCOPOLAMINE 1 MG/3DAYS TD PT72
MEDICATED_PATCH | TRANSDERMAL | Status: AC
  Filled 2013-07-19: qty 1

## 2013-07-19 MED ORDER — TETANUS-DIPHTH-ACELL PERTUSSIS 5-2.5-18.5 LF-MCG/0.5 IM SUSP
0.5000 mL | Freq: Once | INTRAMUSCULAR | Status: DC
  Filled 2013-07-19: qty 0.5

## 2013-07-19 MED ORDER — OXYTOCIN 10 UNIT/ML IJ SOLN
40.0000 [IU] | INTRAVENOUS | Status: DC | PRN
  Administered 2013-07-19: 40 [IU] via INTRAVENOUS

## 2013-07-19 MED ORDER — MEPERIDINE HCL 25 MG/ML IJ SOLN: 6.2500 mg | INTRAMUSCULAR | Status: DC | PRN

## 2013-07-19 MED ORDER — MORPHINE SULFATE 0.5 MG/ML IJ SOLN
INTRAMUSCULAR | Status: AC
  Filled 2013-07-19: qty 10

## 2013-07-19 MED ORDER — PHENYLEPHRINE HCL 10 MG/ML IJ SOLN
INTRAMUSCULAR | Status: AC
  Filled 2013-07-19: qty 1

## 2013-07-19 MED ORDER — FENTANYL CITRATE 0.05 MG/ML IJ SOLN
INTRAMUSCULAR | Status: DC | PRN
  Administered 2013-07-19: 25 ug via INTRATHECAL

## 2013-07-19 MED ORDER — KETOROLAC TROMETHAMINE 30 MG/ML IJ SOLN: 15.0000 mg | Freq: Once | INTRAMUSCULAR | Status: DC | PRN

## 2013-07-19 MED ORDER — PHENYLEPHRINE 8 MG IN D5W 100 ML (0.08MG/ML) PREMIX OPTIME
INJECTION | INTRAVENOUS | Status: AC
  Filled 2013-07-19: qty 100

## 2013-07-19 MED ORDER — MEASLES, MUMPS & RUBELLA VAC ~~LOC~~ INJ
0.5000 mL | INJECTION | Freq: Once | SUBCUTANEOUS | Status: DC
  Filled 2013-07-19: qty 0.5

## 2013-07-19 MED ORDER — EPHEDRINE 5 MG/ML INJ
INTRAVENOUS | Status: AC
  Filled 2013-07-19: qty 10

## 2013-07-19 MED ORDER — LACTATED RINGERS IV SOLN
INTRAVENOUS | Status: DC | PRN
  Administered 2013-07-19 (×2): via INTRAVENOUS

## 2013-07-19 MED ORDER — CEFAZOLIN SODIUM-DEXTROSE 2-3 GM-% IV SOLR
INTRAVENOUS | Status: AC
  Filled 2013-07-19: qty 50

## 2013-07-19 MED ORDER — ONDANSETRON HCL 4 MG/2ML IJ SOLN
INTRAMUSCULAR | Status: AC
  Filled 2013-07-19: qty 2

## 2013-07-19 MED ORDER — PRENATAL MULTIVITAMIN CH
1.0000 | ORAL_TABLET | Freq: Every day | ORAL | Status: DC
  Administered 2013-07-21: 1 via ORAL
  Filled 2013-07-19: qty 1

## 2013-07-19 MED ORDER — OXYTOCIN 40 UNITS IN LACTATED RINGERS INFUSION - SIMPLE MED: 62.5000 mL/h | INTRAVENOUS | Status: AC

## 2013-07-19 MED ORDER — ONDANSETRON HCL 4 MG/2ML IJ SOLN: 4.0000 mg | INTRAMUSCULAR | Status: DC | PRN

## 2013-07-19 MED ORDER — OXYCODONE-ACETAMINOPHEN 5-325 MG PO TABS
1.0000 | ORAL_TABLET | ORAL | Status: DC | PRN
  Administered 2013-07-19 – 2013-07-21 (×7): 2 via ORAL
  Administered 2013-07-21 (×2): 1 via ORAL
  Administered 2013-07-21: 2 via ORAL
  Filled 2013-07-19 (×6): qty 2
  Filled 2013-07-19: qty 1
  Filled 2013-07-19 (×2): qty 2
  Filled 2013-07-19: qty 1

## 2013-07-19 MED ORDER — METOCLOPRAMIDE HCL 5 MG/ML IJ SOLN: 10.0000 mg | Freq: Three times a day (TID) | INTRAMUSCULAR | Status: DC | PRN

## 2013-07-19 MED ORDER — FENTANYL CITRATE 0.05 MG/ML IJ SOLN
INTRAMUSCULAR | Status: AC
  Filled 2013-07-19: qty 2

## 2013-07-19 MED ORDER — SENNOSIDES-DOCUSATE SODIUM 8.6-50 MG PO TABS
2.0000 | ORAL_TABLET | ORAL | Status: DC
  Administered 2013-07-20 – 2013-07-21 (×3): 2 via ORAL
  Filled 2013-07-19 (×3): qty 2

## 2013-07-19 MED ORDER — FAMOTIDINE IN NACL 20-0.9 MG/50ML-% IV SOLN
20.0000 mg | Freq: Once | INTRAVENOUS | Status: AC
  Administered 2013-07-19: 20 mg via INTRAVENOUS
  Filled 2013-07-19: qty 50

## 2013-07-19 MED ORDER — SIMETHICONE 80 MG PO CHEW
80.0000 mg | CHEWABLE_TABLET | ORAL | Status: DC
  Administered 2013-07-20 – 2013-07-21 (×2): 80 mg via ORAL
  Filled 2013-07-19 (×3): qty 1

## 2013-07-19 MED ORDER — OXYTOCIN 10 UNIT/ML IJ SOLN
INTRAMUSCULAR | Status: AC
  Filled 2013-07-19: qty 4

## 2013-07-19 MED ORDER — ONDANSETRON HCL 4 MG/2ML IJ SOLN: 4.0000 mg | Freq: Three times a day (TID) | INTRAMUSCULAR | Status: DC | PRN

## 2013-07-19 MED ORDER — CITRIC ACID-SODIUM CITRATE 334-500 MG/5ML PO SOLN
30.0000 mL | Freq: Once | ORAL | Status: DC
Start: 2013-07-19 — End: 2013-07-22

## 2013-07-19 MED ORDER — KETOROLAC TROMETHAMINE 30 MG/ML IJ SOLN
INTRAMUSCULAR | Status: AC
  Filled 2013-07-19: qty 1

## 2013-07-19 MED ORDER — DEXAMETHASONE SODIUM PHOSPHATE 10 MG/ML IJ SOLN
INTRAMUSCULAR | Status: AC
  Filled 2013-07-19: qty 1

## 2013-07-19 MED ORDER — NALOXONE HCL 1 MG/ML IJ SOLN
1.0000 ug/kg/h | INTRAVENOUS | Status: DC | PRN
  Filled 2013-07-19: qty 2

## 2013-07-19 MED ORDER — WITCH HAZEL-GLYCERIN EX PADS: 1.0000 "application " | MEDICATED_PAD | CUTANEOUS | Status: DC | PRN

## 2013-07-19 MED ORDER — HYDROMORPHONE HCL PF 1 MG/ML IJ SOLN: 0.2500 mg | INTRAMUSCULAR | Status: DC | PRN

## 2013-07-19 MED ORDER — LACTATED RINGERS IV SOLN
INTRAVENOUS | Status: DC | PRN
  Administered 2013-07-19 (×3): via INTRAVENOUS

## 2013-07-19 MED ORDER — PRENATAL MULTIVITAMIN CH: 1.0000 | ORAL_TABLET | Freq: Every day | ORAL | Status: DC

## 2013-07-19 MED ORDER — ONDANSETRON HCL 4 MG PO TABS: 4.0000 mg | ORAL_TABLET | ORAL | Status: DC | PRN

## 2013-07-19 MED ORDER — SIMETHICONE 80 MG PO CHEW: 80.0000 mg | CHEWABLE_TABLET | ORAL | Status: DC | PRN

## 2013-07-19 MED ORDER — MENTHOL 3 MG MT LOZG: 1.0000 | LOZENGE | OROMUCOSAL | Status: DC | PRN

## 2013-07-19 MED ORDER — NALOXONE HCL 0.4 MG/ML IJ SOLN: 0.4000 mg | INTRAMUSCULAR | Status: DC | PRN

## 2013-07-19 MED ORDER — DIPHENHYDRAMINE HCL 50 MG/ML IJ SOLN: 25.0000 mg | INTRAMUSCULAR | Status: DC | PRN

## 2013-07-19 MED ORDER — ONDANSETRON HCL 4 MG/2ML IJ SOLN
INTRAMUSCULAR | Status: DC | PRN
  Administered 2013-07-19: 4 mg via INTRAVENOUS

## 2013-07-19 MED ORDER — ZOLPIDEM TARTRATE 5 MG PO TABS: 5.0000 mg | ORAL_TABLET | Freq: Every evening | ORAL | Status: DC | PRN

## 2013-07-19 MED ORDER — IBUPROFEN 600 MG PO TABS
600.0000 mg | ORAL_TABLET | Freq: Four times a day (QID) | ORAL | Status: DC | PRN
  Administered 2013-07-20: 600 mg via ORAL
  Filled 2013-07-19: qty 1

## 2013-07-19 SURGICAL SUPPLY — 30 items
BARRIER ADHS 3X4 INTERCEED (GAUZE/BANDAGES/DRESSINGS) IMPLANT
CLAMP CORD UMBIL (MISCELLANEOUS) IMPLANT
CLOTH BEACON ORANGE TIMEOUT ST (SAFETY) ×3 IMPLANT
DRAPE LG THREE QUARTER DISP (DRAPES) ×3 IMPLANT
DRSG OPSITE POSTOP 4X10 (GAUZE/BANDAGES/DRESSINGS) ×3 IMPLANT
DURAPREP 26ML APPLICATOR (WOUND CARE) ×3 IMPLANT
ELECT REM PT RETURN 9FT ADLT (ELECTROSURGICAL) ×3
ELECTRODE REM PT RTRN 9FT ADLT (ELECTROSURGICAL) ×1 IMPLANT
EXTRACTOR VACUUM KIWI (MISCELLANEOUS) IMPLANT
GLOVE BIO SURGEON STRL SZ 6.5 (GLOVE) ×2 IMPLANT
GLOVE BIO SURGEONS STRL SZ 6.5 (GLOVE) ×1
GLOVE BIOGEL PI IND STRL 7.0 (GLOVE) ×3 IMPLANT
GLOVE BIOGEL PI INDICATOR 7.0 (GLOVE) ×6
GOWN STRL REUS W/TWL LRG LVL3 (GOWN DISPOSABLE) ×6 IMPLANT
KIT ABG SYR 3ML LUER SLIP (SYRINGE) IMPLANT
NEEDLE HYPO 25X5/8 SAFETYGLIDE (NEEDLE) IMPLANT
NS IRRIG 1000ML POUR BTL (IV SOLUTION) ×3 IMPLANT
PACK C SECTION WH (CUSTOM PROCEDURE TRAY) ×3 IMPLANT
PAD ABD 7.5X8 STRL (GAUZE/BANDAGES/DRESSINGS) ×3 IMPLANT
PAD OB MATERNITY 4.3X12.25 (PERSONAL CARE ITEMS) ×3 IMPLANT
RTRCTR C-SECT PINK 25CM LRG (MISCELLANEOUS) ×3 IMPLANT
SPONGE GAUZE 4X4 12PLY STER LF (GAUZE/BANDAGES/DRESSINGS) ×3 IMPLANT
SUT VIC AB 0 CT1 36 (SUTURE) ×18 IMPLANT
SUT VIC AB 2-0 CT1 27 (SUTURE) ×2
SUT VIC AB 2-0 CT1 TAPERPNT 27 (SUTURE) ×1 IMPLANT
SUT VIC AB 4-0 PS2 27 (SUTURE) ×3 IMPLANT
TAPE CLOTH SURG 4X10 WHT LF (GAUZE/BANDAGES/DRESSINGS) ×3 IMPLANT
TOWEL OR 17X24 6PK STRL BLUE (TOWEL DISPOSABLE) ×6 IMPLANT
TRAY FOLEY CATH 14FR (SET/KITS/TRAYS/PACK) ×3 IMPLANT
WATER STERILE IRR 1000ML POUR (IV SOLUTION) IMPLANT

## 2013-07-19 NOTE — MAU Note (Signed)
Pt belly does not feel hard, but feels firm. Belly does not feel like it is relaxing.

## 2013-07-19 NOTE — Anesthesia Postprocedure Evaluation (Signed)
  Anesthesia Post-op Note  Patient: Amy Pham  Procedure(s) Performed: Procedure(s): CESAREAN SECTION (N/A)  Patient is awake, responsive, moving her legs, and has signs of resolution of her numbness. Pain and nausea are reasonably well controlled. Vital signs are stable and clinically acceptable. Oxygen saturation is clinically acceptable. There are no apparent anesthetic complications at this time. Patient is ready for discharge.
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
## 2013-07-19 NOTE — Transfer of Care (Signed)
Immediate Anesthesia Transfer of Care Note  Patient: Amy Pham  Procedure(s) Performed: Procedure(s): CESAREAN SECTION (N/A)  Patient Location: PACU  Anesthesia Type:Spinal  Level of Consciousness: awake, alert , oriented and patient cooperative  Airway & Oxygen Therapy: Patient Spontanous Breathing  Post-op Assessment: Report given to PACU RN and Post -op Vital signs reviewed and stable  Post vital signs: Reviewed and stable  Complications: No apparent anesthesia complications

## 2013-07-19 NOTE — Anesthesia Postprocedure Evaluation (Signed)
  Anesthesia Post-op Note  Patient: Amy Pham  Procedure(s) Performed: Procedure(s): CESAREAN SECTION (N/A)  Patient Location: PACU and Mother/Baby  Anesthesia Type:Spinal  Level of Consciousness: awake, alert , oriented and patient cooperative  Airway and Oxygen Therapy: Patient Spontanous Breathing  Post-op Pain: mild  Post-op Assessment: Post-op Vital signs reviewed, No headache, No backache, No residual numbness and No residual motor weakness  Post-op Vital Signs: Reviewed and stable  Complications: No apparent anesthesia complications

## 2013-07-19 NOTE — Anesthesia Preprocedure Evaluation (Signed)
Anesthesia Evaluation  Patient identified by MRN, date of birth, ID band Patient awake    Reviewed: Allergy & Precautions, H&P , NPO status , Patient's Chart, lab work & pertinent test results  Airway Mallampati: I TM Distance: >3 FB Neck ROM: full    Dental no notable dental hx.    Pulmonary Current Smoker,    Pulmonary exam normal       Cardiovascular negative cardio ROS      Neuro/Psych    GI/Hepatic negative GI ROS, Neg liver ROS,   Endo/Other  negative endocrine ROS  Renal/GU negative Renal ROS     Musculoskeletal   Abdominal   Peds  Hematology negative hematology ROS (+)   Anesthesia Other Findings   Reproductive/Obstetrics (+) Pregnancy                           Anesthesia Physical Anesthesia Plan  ASA: II and emergent  Anesthesia Plan: Spinal   Post-op Pain Management:    Induction:   Airway Management Planned:   Additional Equipment:   Intra-op Plan:   Post-operative Plan:   Informed Consent:   Plan Discussed with: CRNA and Surgeon  Anesthesia Plan Comments:         Anesthesia Quick Evaluation

## 2013-07-19 NOTE — Lactation Note (Signed)
This note was copied from the chart of Boy Faythe GheeMeredith Ashworth. Lactation Consultation Note  Patient Name: Boy Faythe GheeMeredith Lancour WUJWJ'XToday's Date: 07/19/2013 Reason for consult: Initial assessment of this second-time mother and her newborn at 512 hours of age.  Mom is sleepy but able to respond briefly to [facility] at this visit.  Baby has latched well with assistance twice since birth but baby sleepy at last attempt, about an hour ago, per mom and RN, Marcelino DusterMichelle.  Mom has been shown hand expression as documented by RN, Lafonda Mossesonna Wear at initial feeding (0940 am) [facility] provided [facility] Resource brochure and reviewed [facility] services and list of community and web site resources.  LC encouraged review of Baby and Me pp 9, 14 and 20-25 for STS and BF information.  LC encouraged STS and cue feedings and informed parents of normal newborn sleepiness during first 24 hours.    Maternal Data Formula Feeding for Exclusion: No Infant to breast within first hour of birth: No Breastfeeding delayed due to:: Maternal status (mom sleepy) Has patient been taught Hand Expression?: Yes (see RN documentation at [id number]) Does the patient have breastfeeding experience prior to this delivery?: Yes  Feeding Feeding Type: Breast Fed Length of feed: 0 min  LATCH Score/Interventions Latch: Too sleepy or reluctant, no latch achieved, no sucking elicited. Intervention(s): Skin to skin Intervention(s): Assist with latch  Audible Swallowing: None Intervention(s): Skin to skin;Hand expression Intervention(s): Hand expression  Type of Nipple: Everted at rest and after stimulation  Comfort (Breast/Nipple): Soft / non-tender     Hold (Positioning): Full assist, staff holds infant at breast Intervention(s): Support Pillows;Skin to skin  LATCH Score: 4 (most recent attempt, when baby sleepy, per RN assessment)  Lactation Tools Discussed/Used   STS, cue feedings, hand expression  Consult Status Consult Status: Follow-up Date:  07/20/13 Follow-up type: In-patient    Warrick ParisianBryant, Jahzier Villalon [facility] 07/19/2013, 9:02 PM

## 2013-07-19 NOTE — Progress Notes (Signed)
Dr Richarda BladeAdamo notified pt requesting nicotine patch

## 2013-07-19 NOTE — Anesthesia Procedure Notes (Signed)

## 2013-07-19 NOTE — MAU Note (Signed)
Pt belly feels soft to palpation

## 2013-07-19 NOTE — Op Note (Signed)
Amy KindredMeredith B Kopper PROCEDURE DATE: 07/19/2013  PREOPERATIVE DIAGNOSES: Intrauterine pregnancy at  7476w1d weeks gestation; hx of previous c/s x1, vaginal bleeding with suspected abruption remote from delivery  POSTOPERATIVE DIAGNOSES: The same  PROCEDURE: Repeat Low Transverse Cesarean Section  SURGEON:  Dr. Scheryl DarterJames Arnold  ASSISTANT:  Dr. Rulon AbideKeli Brielle Moro  ANESTHESIOLOGIST: Dr. Cristela BlueKyle Jackson  INDICATIONS: Amy Pham is a 23 y.o. [id number] at 6176w1d here for cesarean section secondary to the indications listed under preoperative diagnoses; please see preoperative note for further details.  The risks of cesarean section were discussed with the patient including but were not limited to: bleeding which may require transfusion or reoperation; infection which may require antibiotics; injury to bowel, bladder, ureters or other surrounding organs; injury to the fetus; need for additional procedures including hysterectomy in the event of a life-threatening hemorrhage; placental abnormalities wth subsequent pregnancies, incisional problems, thromboembolic phenomenon and other postoperative/anesthesia complications.   The patient concurred with the proposed plan, giving informed written consent for the procedure.    FINDINGS:  Viable female infant in cephalic presentation.  Apgars *7and 9.  Blood tinged amniotic fluid.  Large clot burden and placenta in pieces, three vessel cord.  Normal uterus, fallopian tubes and ovaries bilaterally.  ANESTHESIA: Spinal INTRAVENOUS FLUIDS: 3000 ml ESTIMATED BLOOD LOSS: 350 ml URINE OUTPUT:  400 ml SPECIMENS: Placenta sent to pathology COMPLICATIONS: None immediate  PROCEDURE IN DETAIL:  The patient preoperatively received intravenous antibiotics and had sequential compression devices applied to her lower extremities.  She was then taken to the operating room where spinal anesthesia was administered. She was then placed in a dorsal supine position with a leftward tilt, and  prepped and draped in a sterile manner.  A foley catheter was placed into her bladder and attached to constant gravity.  After an adequate timeout was performed, a Pfannenstiel skin incision was made with scalpel and carried through to the underlying layer of fascia. The fascia was incised in the midline, and this incision was extended bilaterally using the Mayo scissors.  Kocher clamps were applied to the superior aspect of the fascial incision and the underlying rectus muscles were dissected off bluntly. A similar process was carried out on the inferior aspect of the fascial incision. The rectus muscles were separated in the midline bluntly and the peritoneum was entered bluntly. A bladder flap was created. Attention was turned to the lower uterine segment where a low transverse hysterotomy was made with a scalpel and extended bilaterally bluntly.  Significant clot burden was encountered. The infant was successfully delivered, the cord was clamped and cut and the infant was handed over to awaiting neonatology team. Uterine massage was then administered, and the placenta delivered in two pieces with a three-vessel cord. The uterus was then cleared of clot and debris.  The hysterotomy was closed with 0 Vicryl in a running locked fashion, and an imbricating layer was also placed with 0 Vicryl. The pelvis was cleared of all clot and debris. Hemostasis was confirmed on all surfaces.  The peritoneum was reapproximated using 0 Vicryl running stitches. The fascia was then closed using 0 Vicryl in a running fashion.  The subcutaneous layer was irrigated.  The skin was closed with a 4-0 Vicryl subcuticular stitch. The patient tolerated the procedure well. Sponge, lap, instrument and needle counts were correct x 2.  She was taken to the recovery room in stable condition.   Amy Pham, Redmond BasemanKELI L, MD

## 2013-07-20 ENCOUNTER — Encounter (HOSPITAL_COMMUNITY): Payer: Self-pay | Admitting: Obstetrics & Gynecology

## 2013-07-20 LAB — CBC
HCT: 28.4 % — ABNORMAL LOW (ref 36.0–46.0)
HEMATOCRIT: 26.2 % — AB (ref 36.0–46.0)
HEMOGLOBIN: 9.1 g/dL — AB (ref 12.0–15.0)
Hemoglobin: 10 g/dL — ABNORMAL LOW (ref 12.0–15.0)
MCH: 32.7 pg (ref 26.0–34.0)
MCH: 32.7 pg (ref 26.0–34.0)
MCHC: 35.2 g/dL (ref 30.0–36.0)
MCHC: 34.7 g/dL (ref 30.0–36.0)
MCV: 92.8 fL (ref 78.0–100.0)
MCV: 94.2 fL (ref 78.0–100.0)
PLATELETS: 118 10*3/uL — AB (ref 150–400)
Platelets: 112 10*3/uL — ABNORMAL LOW (ref 150–400)
RBC: 3.06 MIL/uL — ABNORMAL LOW (ref 3.87–5.11)
RBC: 2.78 MIL/uL — ABNORMAL LOW (ref 3.87–5.11)
RDW: 12.8 % (ref 11.5–15.5)
RDW: 13.1 % (ref 11.5–15.5)
WBC: 15.9 10*3/uL — AB (ref 4.0–10.5)
WBC: 13.5 10*3/uL — ABNORMAL HIGH (ref 4.0–10.5)

## 2013-07-20 LAB — CULTURE, BETA STREP (GROUP B ONLY)

## 2013-07-20 NOTE — Lactation Note (Signed)
This note was copied from the chart of Amy Pham Georgiades. Lactation Consultation Note  Patient Name: Amy Pham Stelmach ZOXWR'UToday's Date: 07/20/2013 Reason for consult: Follow-up assessment;Difficult latch and mom and previous LC requested follow-up. Mom experiencing severe uterine cramping pain; RN to medicate as soon as possible (next rx due in 30 minutes) but LC was at bedside, assisting with latching and reviewed other comfort measures, include effleurage, visualization and application of heat to abdomen (RN, Marcelino DusterMichelle informed and providing a heating pad).  Baby was rooting vigorously and latches easily with breast support and compression and mom says "I'm learning" although the contraction pain is distracting her.  Baby latches quickly and sustains latch and rhythmical sucking bursts with an increase in swallows during feeding.  Mom able to position and help latch baby with [facility] guidance.  Mom also is stimulating baby when sleepy and baby is STS with loose blanket around his back.  LC informed mom that usually the contraction pain subsides after about a week.   Maternal Data    Feeding Feeding Type: Breast Fed Length of feed: 15 min  LATCH Score/Interventions Latch: Grasps breast easily, tongue down, lips flanged, rhythmical sucking. Intervention(s): Skin to skin;Teach feeding cues;Waking techniques Intervention(s): Adjust position;Assist with latch;Breast compression  Audible Swallowing: Spontaneous and intermittent Intervention(s): Skin to skin;Hand expression Intervention(s): Skin to skin;Hand expression;Alternate breast massage  Type of Nipple: Everted at rest and after stimulation  Comfort (Breast/Nipple): Soft / non-tender     Hold (Positioning): Assistance needed to correctly position infant at breast and maintain latch. Intervention(s): Breastfeeding basics reviewed;Support Pillows;Position options;Skin to skin (also discussed reason for uterine ctx associated with  breastfeeding)  LATCH Score: 9  Lactation Tools Discussed/Used   Signs of proper latch Stimulation techniques STS, breast support and compression  Consult Status Consult Status: Follow-up Date: 07/21/13 Follow-up type: In-patient    Warrick ParisianBryant, Steward Sames [facility] 07/20/2013, 9:45 PM

## 2013-07-20 NOTE — Clinical Social Work Maternal (Signed)
Clinical Social Work Department PSYCHOSOCIAL ASSESSMENT - MATERNAL/CHILD 07/20/2013  Patient:  Amy Pham, Amy Pham  Account Number:  [id number]  Admit Date:  07/19/2013  Ardine Eng Name:   [place] Social Worker:  Gerri Spore, LCSW   Date/Time:  07/20/2013 02:11 PM  Date Referred:  07/20/2013   Referral source  CN     Referred reason  Depression/Anxiety  Substance Abuse  Other - See comment   Other referral source:    I:  FAMILY / HOME ENVIRONMENT Child's legal guardian:  PARENT  Guardian - Name Akins - Age [place] [address], [place] [address]  Jeannetta Nap 24    Other household support members/support persons Name Relationship DOB  Marlene Bast FATHER   Sunday Spillers MOTHER   Nadeen Landau SON 05/31/11   Other support:   Pt & FOB's parents    II  PSYCHOSOCIAL DATA Information Source:  Patient Interview  Occupational hygienist Employment:   Museum/gallery curator resources:  Multimedia programmer If [place]:  [place] / Grade:   Maternity Care Coordinator / Child Services Coordination / Early Interventions:  Cultural issues impacting care:    III  STRENGTHS Strengths  Adequate Resources  Home prepared for Child (including basic supplies)  Supportive family/friends   Strength comment:    IV  RISK FACTORS AND CURRENT PROBLEMS Current Problem:  YES   Risk Factor & Current Problem Patient Issue Family Issue Risk Factor / Current Problem Comment  Mental Illness Y N Hx of depression/anxiety  Substance Abuse Y N Hx of opiate addiction & MJ use  Other - See comment Y N [employer]    V  SOCIAL WORK ASSESSMENT CSW met with pt to assess her current social situation & offer resources as needed.  Pt was accompanied by FOB & gave CSW verbal permission to speak in his presence.  Pt currently lives with her parents, Elenore Rota & Mallarie Voorhies, who she describes as supportive.  Pt was  diagnosed with depression/anxiety at age 23.  She told CSW that she was place at [facility] residential facility by her parents at 23 for behavioral issues.  She lived at that facility until she aged out at 23  Pts depression/anxiety symptoms were treated with several medications "on & off" during 23  She denies any SI/HI.  Pt denies any recent or current depressed moods at this time & declines counseling referrals at this time.  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  According to pt, she abused Opana 3m, "daily" for 1 year, off & on.  [facility] was confirmed [facility]  She was started on 223mof methadone & increased to 3084mithin a short period of time.  Pt told CSW that she did not like the way the methadone made her feel & started to wean down 1mg12mr day until she was lowered to 2mg.26mt & FOB also expressed concern about the infant withdrawing & did not want their baby to have prolonged hospital stay.  Pt denies any opiate use during pregnancy.  Since [facility] was not clear [facility] offered to sign [facility] of information.  Copy of signed released placed in pts paper chart & faxed to agency.  [facility]  [facility] Opana as well.  Pt has history of MJ however denies any use during this pregnancy.  Pts son tested positive for MJ & [place] Protective Services, CPS was involved.  MOB said she did not want to experience CPS involvement anymore & therefore has not smoked MJ anymore.  The couple has extensive history with [facility] CPs however view it as a positive experience now.  They received intensive in-home services from [facility], [facility]) an agency out of [place].  Verlin Grills was their assigned worker who came to their home everyday for 3-4 hours a day for 2 months to observed their  interaction.   The couple successfully completed that program. They deny any history of domestic violence.  CSW verified that CPS case was closed in March '14.  Pt missed several doctors appointments due to lack of transportation.  CSW explained hospital drug testing policy & pt verbalized an understanding.  UDS is negative, meconium results are pending.  The couple has all the necessary supplies for the infant & good family support.  FOB was very involved in conversation & attentive.  Pt was very pleasant & cooperative with CSW.  CSW discussed PP depression symptoms & encouraged her to seek medical attention if needed.  CSW will continue to monitor drug screen results & make a referral if needed.         VI SOCIAL WORK PLAN Social Work Plan  No Further Intervention Required / No Barriers to Discharge   Type of pt/family education:   If child protective services report - county:   If child protective services report - date:   Information/referral to community resources comment:   Other social work plan:

## 2013-07-20 NOTE — Progress Notes (Signed)
Subjective: Postpartum Day 1: Cesarean Delivery Patient reports tolerating PO, + flatus and no problems voiding.    Objective: Vital signs in last 24 hours: Temp:  [97.7 F (36.5 C)-99.2 F (37.3 C)] 98.8 F (37.1 C) (01/29 0500) Pulse Rate:  [61-106] 87 (01/29 0500) Resp:  [12-22] 18 (01/29 0500) BP: (81-105)/(46-61) 94/50 mmHg (01/29 0500) SpO2:  [97 %-100 %] 100 % (01/29 0500) Weight:  [76.204 kg (168 lb)] 76.204 kg (168 lb) (01/28 0943)  Physical Exam:  General: alert, cooperative and no distress Lochia: appropriate Uterine Fundus: firm Incision: no significant drainage, no significant erythema DVT Evaluation: No evidence of DVT seen on physical exam. No cords or calf tenderness. No significant calf/ankle edema.   Recent Labs  07/19/13 1255 07/20/13 0610  HGB 10.0* 9.1*  HCT 28.4* 26.2*    Assessment/Plan: Status post Cesarean section. Doing well postoperatively.  Continue current care. Breastfeeding. Planning on mirena for contraception.  Beverely Lowdamo, Elena 07/20/2013, 8:49 AM  I spoke with and examined patient and agree with resident's note and plan of care.  Tawana ScaleMichael Ryan Samiel Peel, MD OB Fellow 07/20/2013 8:55 AM

## 2013-07-20 NOTE — Anesthesia Postprocedure Evaluation (Signed)
Anesthesia Post Note  Patient: Amy Pham  Procedure(s) Performed: Procedure(s) (LRB): CESAREAN SECTION (N/A)  Anesthesia type: Spinal  Patient location: Mother/Baby  Post pain: Pain level controlled  Post assessment: Post-op Vital signs reviewed  Last Vitals:  Filed Vitals:   07/20/13 0500  BP: 94/50  Pulse: 87  Temp: 37.1 C  Resp: 18    Post vital signs: Reviewed  Level of consciousness: awake  Complications: No apparent anesthesia complications

## 2013-07-21 ENCOUNTER — Ambulatory Visit (HOSPITAL_COMMUNITY): Payer: Managed Care, Other (non HMO)

## 2013-07-21 ENCOUNTER — Ambulatory Visit (HOSPITAL_COMMUNITY): Admission: RE | Admit: 2013-07-21 | Payer: Managed Care, Other (non HMO) | Source: Ambulatory Visit

## 2013-07-21 MED ORDER — PNEUMOCOCCAL VAC POLYVALENT 25 MCG/0.5ML IJ INJ
0.5000 mL | INJECTION | INTRAMUSCULAR | Status: AC
  Administered 2013-07-22: 0.5 mL via INTRAMUSCULAR
  Filled 2013-07-21: qty 0.5

## 2013-07-21 NOTE — Lactation Note (Signed)
This note was copied from the chart of Amy Pham Stemmler. Lactation Consultation Note  Patient Name: Amy Pham Finder EAVWU'JToday's Date: 07/21/2013 Reason for consult: Follow-up assessment;Infant < 6lbs;Late preterm infant;Difficult latch Baby crying and showing feeding cues. Mother's are tender and left nipple is bruised, red and has a blister. Ducks noted on both breast and milk volume increasing. Baby latched deep on the breast and suckling in a rhythmic pattern with swallows. Mother is having increased uterine cramping that she rates as 8 on a pain scale. Education about latching and assisting baby. Comfort gels and shells given to provide comfort and promote healing. Mother had several questions. RN in and giving mother meds for uterine pain. LC and RN  to follow and assist as needed. Mother asking about pumping breast if pain continues. Staff will help mother with pumping as needed. Mother gave formula earlier because baby was not latching, getting  Nipple and causing pain. Mother has had help with latching today.  Maternal Data    Feeding    LATCH Score/Interventions Latch: Grasps breast easily, tongue down, lips flanged, rhythmical sucking. Intervention(s): Skin to skin Intervention(s): Assist with latch  Audible Swallowing: Spontaneous and intermittent Intervention(s): Hand expression Intervention(s): Alternate breast massage  Type of Nipple: Everted at rest and after stimulation  Comfort (Breast/Nipple): Filling, red/small blisters or bruises, mild/mod discomfort  Problem noted: Filling;Cracked, bleeding, blisters, bruises;Mild/Moderate discomfort Interventions  (Cracked/bleeding/bruising/blister): Expressed breast milk to nipple;Other (comment) (breast shells and comgels given) Interventions (Mild/moderate discomfort): Comfort gels;Breast shields  Hold (Positioning): Assistance needed to correctly position infant at breast and maintain latch. Intervention(s):  Breastfeeding basics reviewed  LATCH Score: 8  Lactation Tools Discussed/Used     Consult Status Consult Status: Follow-up Date: 07/22/13 Follow-up type: In-patient    Christella HartiganDaly, Tanara Turvey M 07/21/2013, 5:10 PM

## 2013-07-21 NOTE — Discharge Summary (Signed)
Obstetric Discharge Summary Reason for Admission: placental abruption Prenatal Procedures: none Intrapartum Procedures: cesarean: low cervical, transverse Postpartum Procedures: antibiotics Complications-Operative and Postpartum: none Hemoglobin  Date Value Range Status  07/20/2013 9.1* 12.0 - 15.0 g/dL Final  1/6/10968/09/2012 04.513.4   Final     HCT  Date Value Range Status  07/20/2013 26.2* 36.0 - 46.0 % Final  01/23/2013 40   Final   Hospital Course Amy Pham is a 23 y.o. [id number] who underwent RLTCS at 6689w1d for placental abruption.  Healthy baby boy with APGARs of 7 and 9.  PPD #2.  Patient is doing well overall.  Bleeding is less than a period.  Pain is well-controlled with meds. Ambulating well and tolerating PO. + BM, + Flatus. Choosing Mirena for MOC. Plans to breast-feed.  Discussed post-operative CS instructions.  Schedule follow-up visit in 4-6 weeks.    Physical Exam:  General: alert, cooperative and no distress Lochia: appropriate Uterine Fundus: firm Incision: healing well DVT Evaluation: No evidence of DVT seen on physical exam.  Discharge Diagnoses: Term Pregnancy-delivered, Placental Abruption  Discharge Information: Date: 07/21/2013 Activity: pelvic rest Diet: routine Medications: Tylenol #3 Condition: stable Instructions: refer to practice specific booklet Discharge to: home   Newborn Data: Live born female  Birth Weight: 5 lb 13 oz (2637 g) APGAR: 7, 9  Home with mother.  TUCKER, BRITTON L 07/21/2013, 7:53 AM  I have seen and examined this patient and agree the above assessment. CRESENZO-DISHMAN,Amy Pham 07/24/2013 2:55 PM

## 2013-07-22 ENCOUNTER — Ambulatory Visit: Payer: Self-pay

## 2013-07-22 LAB — TYPE AND SCREEN
ABO/RH(D): O POS
Antibody Screen: NEGATIVE
UNIT DIVISION: 0
Unit division: 0

## 2013-07-22 MED ORDER — OXYCODONE-ACETAMINOPHEN 5-325 MG PO TABS: 1.0000 | ORAL_TABLET | ORAL | Status: DC | PRN

## 2013-07-22 MED ORDER — IBUPROFEN 600 MG PO TABS: 600.0000 mg | ORAL_TABLET | Freq: Four times a day (QID) | ORAL | Status: DC | PRN

## 2013-07-22 NOTE — Discharge Summary (Signed)
  Obstetric Discharge Summary  Reason for Admission: placental abruption @ 37.1wks Prenatal Procedures: none  Intrapartum Procedures: cesarean: low cervical, transverse  Postpartum Procedures: antibiotics  Complications-Operative and Postpartum: none  Hemoglobin   Date  Value  Range  Status   07/20/2013  9.1*  12.0 - 15.0 g/dL  Final   1/6/10968/09/2012  04.513.4   Final      HCT   Date  Value  Range  Status   07/20/2013  26.2*  36.0 - 46.0 %  Final   01/23/2013  40   Final    Hospital Course  Amy Pham is a 23 y.o. [id number] who underwent RLTCS at [redacted]w[redacted]d for placental abruption. [facility] with sudden onset heavy vag bldg. She was consented and taken to the OR for a RLTCS of a healthy baby boy with APGARs of 7 and 9. Her postpartum course has been uneventful and by POD #3 patient is doing well and is ready for discharge. [facility]. Plans to breast-feed. Discussed post-operative CS instructions. [place].  Physical Exam:  General: alert, cooperative and no distress  Heart: RRR Lungs: nl effort Lochia: appropriate  Uterine Fundus: firm  Incision: honeycomb dsg intact, not stained DVT Evaluation: No evidence of DVT seen on physical exam.   Discharge Diagnoses: Term Pregnancy-delivered, Placental Abruption  Discharge Information:  Date: 07/22/2013  Activity: pelvic rest  Diet: routine  Medications: Motrin, PNV, Percocet Condition: stable  Instructions: refer to practice specific booklet  Discharge to: home   Newborn Data:  Live born female  Birth Weight: 5 lb 13 oz (2637 g)  APGAR: 7, 9  Home with mother.  Amy Pham 07/22/2013 8:25 AM

## 2013-07-22 NOTE — Lactation Note (Signed)
This note was copied from the chart of Amy Pham Cooks. Lactation Consultation Note  Patient Name: Amy Pham Dozal ZOXWR'UToday's Date: 07/22/2013 Reason for consult: Follow-up assessment  Mom c/o continued nipple pain.  Both nipples have scabs with positional striping noted.  Infant is a 37.1 LPTI.  Infant had just fed and was asleep not showing feeding cues.  Previous LC noted heart-shaped appearing tongue.  Upon current evaluation LC noted slight bowling of outer-edges of tongue, no lateralization of tongue noted when LC was able to stimulate infant to arouse awake.  Discussed potential need for follow-up by ENT trained to evaluate frenulums if positional striping and pain does not begin to subside regardless of depth and correct positioning on breast.  Infant has breastfed x4 (30-40 minutes) + formula by bottle x3 (21-30 ml) during the night.  Encouraged feeding with feeding cues 8-12 times per day and discussed risks of using a pacifier.  (Infant sucking on pacifier upon entering room).  Discussed need to feed or pump at least 8 times per day and encouraged feeding some EBM by spoon for parents to evaluate baby's ability to extend tongue to what length.  Mom requested extra comfort gels, so LC gave extra c-gels and proper use.  Engorgement prevention discussed; mom has a pump at home.  Encouraged to call for questions after discharge if needed and outpatient visit if needed.  Consult Status Consult Status: Complete    Lendon KaVann, Shley Dolby Walker 07/22/2013, 1:04 PM

## 2013-07-22 NOTE — Discharge Instructions (Signed)
Cesarean Delivery °Care After °Refer to this sheet in the next few weeks. These instructions provide you with information on caring for yourself after your procedure. Your health care provider may also give you specific instructions. Your treatment has been planned according to current medical practices, but problems sometimes occur. Call your health care provider if you have any problems or questions after you go home. °HOME CARE INSTRUCTIONS  °· Only take over-the-counter or prescription medications as directed by your health care provider. °· Do not drink alcohol, especially if you are breastfeeding or taking medication to relieve pain. °· Do not chew or smoke tobacco. °· Continue to use good perineal care. Good perineal care includes: °· Wiping your perineum from front to back. °· Keeping your perineum clean. °· Check your surgical cut (incision) daily for increased redness, drainage, swelling, or separation of skin. °· Clean your incision gently with soap and water every day, and then pat it dry. If your health care provider says it is OK, leave the incision uncovered. Use a bandage (dressing) if the incision is draining fluid or appears irritated. If the adhesive strips across the incision do not fall off within 7 days, carefully peel them off. °· Hug a pillow when coughing or sneezing until your incision is healed. This helps to relieve pain. °· Do not use tampons or douche until your health care provider says it is okay. °· Shower, wash your hair, and take tub baths as directed by your health care provider. °· Wear a well-fitting bra that provides breast support. °· Limit wearing support panties or control-top hose. °· Drink enough fluids to keep your urine clear or pale yellow. °· Eat high-fiber foods such as whole grain cereals and breads, brown rice, beans, and fresh fruits and vegetables every day. These foods may help prevent or relieve constipation. °· Resume activities such as climbing stairs,  driving, lifting, exercising, or traveling as directed by your health care provider. °· Talk to your health care provider about resuming sexual activities. This is dependent upon your risk of infection, your rate of healing, and your comfort and desire to resume sexual activity. °· Try to have someone help you with your household activities and your newborn for at least a few days after you leave the hospital. °· Rest as much as possible. Try to rest or take a nap when your newborn is sleeping. °· Increase your activities gradually. °· Keep all of your scheduled postpartum appointments. It is very important to keep your scheduled follow-up appointments. At these appointments, your health care provider will be checking to make sure that you are healing physically and emotionally. °SEEK MEDICAL CARE IF:  °· You are passing large clots from your vagina. Save any clots to show your health care provider. °· You have a foul smelling discharge from your vagina. °· You have trouble urinating. °· You are urinating frequently. °· You have pain when you urinate. °· You have a change in your bowel movements. °· You have increasing redness, pain, or swelling near your incision. °· You have pus draining from your incision. °· Your incision is separating. °· You have painful, hard, or reddened breasts. °· You have a severe headache. °· You have blurred vision or see spots. °· You feel sad or depressed. °· You have thoughts of hurting yourself or your newborn. °· You have questions about your care, the care of your newborn, or medications. °· You are dizzy or lightheaded. °· You have a rash. °· You   have pain, redness, or swelling at the site of the removed intravenous access (IV) tube. °· You have nausea or vomiting. °· You stopped breastfeeding and have not had a menstrual period within 12 weeks of stopping. °· You are not breastfeeding and have not had a menstrual period within 12 weeks of delivery. °· You have a fever. °SEEK  IMMEDIATE MEDICAL CARE IF: °· You have persistent pain. °· You have chest pain. °· You have shortness of breath. °· You faint. °· You have leg pain. °· You have stomach pain. °· Your vaginal bleeding saturates 2 or more sanitary pads in 1 hour. °MAKE SURE YOU:  °· Understand these instructions. °· Will watch your condition. °· Will get help right away if you are not doing well or get worse. °Document Released: 02/28/2002 Document Revised: 02/08/2013 Document Reviewed: 02/03/2012 °ExitCare® Patient Information ©2014 [employer]. ° ° ° °

## 2013-07-24 ENCOUNTER — Encounter: Payer: Managed Care, Other (non HMO) | Admitting: Obstetrics & Gynecology

## 2013-07-24 NOTE — Discharge Summary (Signed)
Attestation of Attending Supervision of Advanced Practitioner (PA/CNM/NP): Evaluation and management procedures were performed by the Advanced Practitioner under my supervision and collaboration.  I have reviewed the Advanced Practitioner's note and chart, and I agree with the management and plan.  Dawon Troop, MD, FACOG Attending Obstetrician & Gynecologist Faculty Practice, [facility]  

## 2013-07-30 NOTE — Discharge Summary (Signed)
Attestation of Attending Supervision of Advanced Practitioner (CNM/NP): Evaluation and management procedures were performed by the Advanced Practitioner under my supervision and collaboration. I have reviewed the Advanced Practitioner's note and chart, and I agree with the management and plan.  Abdulmalik Darco H. 11:22 AM

## 2013-08-25 ENCOUNTER — Other Ambulatory Visit: Payer: Self-pay | Admitting: Nurse Practitioner

## 2013-09-05 ENCOUNTER — Encounter: Payer: Self-pay | Admitting: Obstetrics & Gynecology

## 2013-09-05 ENCOUNTER — Ambulatory Visit (INDEPENDENT_AMBULATORY_CARE_PROVIDER_SITE_OTHER): Payer: Managed Care, Other (non HMO) | Admitting: Obstetrics & Gynecology

## 2013-09-05 VITALS — Ht 67.0 in | Wt 156.0 lb

## 2013-09-05 DIAGNOSIS — IMO0001 Reserved for inherently not codable concepts without codable children: Secondary | ICD-10-CM

## 2013-09-05 DIAGNOSIS — Z01812 Encounter for preprocedural laboratory examination: Secondary | ICD-10-CM

## 2013-09-05 DIAGNOSIS — Z30430 Encounter for insertion of intrauterine contraceptive device: Secondary | ICD-10-CM

## 2013-09-05 LAB — POCT URINE PREGNANCY: PREG TEST UR: NEGATIVE

## 2013-09-05 MED ORDER — LEVONORGESTREL 20 MCG/24HR IU IUD
1.0000 | INTRAUTERINE_SYSTEM | Freq: Once | INTRAUTERINE | Status: AC
  Administered 2013-09-05: 1 via INTRAUTERINE

## 2013-09-05 NOTE — Progress Notes (Signed)
Patient ID: Amy Pham, female   DOB: 02/08/1991, 23 y.o.   MRN: [id number] Subjective: postpartums/p cesarean section, requests Mirena     Amy Pham is a 23 y.o. female who presents for a postpartum visit. She is 6 weeks postpartum following a low cervical transverse Cesarean section. I have fully reviewed the prenatal and intrapartum course. The delivery was at 37 gestational weeks. Outcome: repeat cesarean section, low transverse incision. Anesthesia: spinal. Postpartum course has been normal. Baby's course has been good . Baby is feeding by bottle -  . Bleeding no bleeding. Bowel function is normal. Bladder function is normal. Patient is sexually active. Contraception method is IUD. Postpartum depression screening: negative.  The following portions of the patient's history were reviewed and updated as appropriate: allergies, current medications, past family history, past medical history, past social history, past surgical history and problem list.  Review of Systems Pertinent items are noted in HPI. some upper abdominal pain  Objective:    Ht 5\' 7"  (1.702 m)  Wt 156 lb (70.761 kg)  BMI 24.43 kg/m2  General:  alert, cooperative and no distress   Breasts:     Lungs:    Heart:     Abdomen: soft, non-tender; bowel sounds normal; no masses,  no organomegaly   Vulva:  normal  Vagina: normal vagina  Cervix:  no lesions  Corpus: normal  Adnexa:  not evaluated  Rectal Exam: Not performed.         Patient identified, informed consent performed, signed copy in chart, time out was performed.  Urine pregnancy test negative.  Speculum placed in the vagina.  Cervix visualized.  Cleaned with Betadine x 2.  Grasped anteriourly with a single tooth tenaculum.  Uterus sounded to 8 cm.  Mirena IUD placed per manufacturer's recommendations.  Strings trimmed to 3 cm.   Patient given post procedure instructions and Mirena care card with expiration date.  Patient is asked to check IUD  strings periodically and follow up in 4-6 weeks for IUD check.  Assessment:     normal  postpartum exam. Pap smear not done at today's visit.   Plan:    1. Contraception: IUD 2. RTC for IUD check 3. Follow up in: 4 week or as needed.   Adam PhenixJames G Arnold, MD 09/05/2013

## 2013-10-03 ENCOUNTER — Ambulatory Visit (INDEPENDENT_AMBULATORY_CARE_PROVIDER_SITE_OTHER): Payer: Managed Care, Other (non HMO) | Admitting: Obstetrics & Gynecology

## 2013-10-03 ENCOUNTER — Encounter: Payer: Self-pay | Admitting: Obstetrics & Gynecology

## 2013-10-03 VITALS — BP 113/75 | HR 76 | Temp 97.3°F | Resp 16 | Ht 67.0 in | Wt 154.0 lb

## 2013-10-03 DIAGNOSIS — Z975 Presence of (intrauterine) contraceptive device: Secondary | ICD-10-CM | POA: Insufficient documentation

## 2013-10-03 DIAGNOSIS — R309 Painful micturition, unspecified: Secondary | ICD-10-CM

## 2013-10-03 DIAGNOSIS — R300 Dysuria: Secondary | ICD-10-CM

## 2013-10-03 LAB — POCT URINALYSIS DIPSTICK
BILIRUBIN UA: NEGATIVE
GLUCOSE UA: NEGATIVE
KETONES UA: NEGATIVE
Leukocytes, UA: NEGATIVE
NITRITE UA: NEGATIVE
Spec Grav, UA: 1.02
Urobilinogen, UA: NEGATIVE
pH, UA: 6.5

## 2013-10-03 NOTE — Patient Instructions (Signed)

## 2013-10-03 NOTE — Progress Notes (Signed)
Subjective:     Patient ID: Amy Pham, female   DOB: 02/01/1991, 23 y.o.   MRN: [id number]  [id number] No LMP recorded. Still blleding daily 4 weeks after Mirena. Notes knot on left upper vulva.   Review of Systems  Genitourinary: Positive for vaginal bleeding, difficulty urinating and menstrual problem. Negative for dysuria.       Objective:   Physical Exam  Constitutional: She appears well-developed. No distress.  Genitourinary: Vagina normal and uterus normal. No vaginal discharge found.  String 2   Left upper vulva .5 cm subcutaneous nodule c/w LN or hair follicle  Skin: Skin is warm.  Psychiatric: She has a normal mood and affect. Her behavior is normal.       Assessment:     Normal IUD placement, DUB PP.     Plan:     Report if bleeding not improving. Kegel exercises, check urine culture. Adequate hydration   Adam PhenixJames G Laini Urick, MD 10/03/2013

## 2013-10-05 LAB — CULTURE, URINE COMPREHENSIVE
COLONY COUNT: NO GROWTH
Organism ID, Bacteria: NO GROWTH

## 2013-11-14 ENCOUNTER — Other Ambulatory Visit: Payer: Self-pay | Admitting: Family Medicine

## 2013-12-11 ENCOUNTER — Telehealth: Payer: Self-pay | Admitting: *Deleted

## 2013-12-11 NOTE — Telephone Encounter (Signed)
Clydie BraunKaren at front desk in [place] office called me and stated pt left a message at the [place] office stating she was having trouble with her birth control. Clydie BraunKaren was not able to get a phone number to reach pt and the phone number for the patient that is listed in EPIC is no longer in service. Tried to call Levonne HubertRobbie Hicks at [phone] but the voicemail has not been set up so I was not able to leave a message. I tried to call Collene GobbleLisa Michalowski at [phone] and left a message on voicemail that I was trying to reach [place]. If pt calls back, she will need to call me at the [place] office at [phone]

## 2013-12-12 ENCOUNTER — Emergency Department (HOSPITAL_COMMUNITY): Payer: Managed Care, Other (non HMO)

## 2013-12-12 ENCOUNTER — Telehealth: Payer: Self-pay | Admitting: *Deleted

## 2013-12-12 ENCOUNTER — Emergency Department (HOSPITAL_COMMUNITY)
Admission: EM | Admit: 2013-12-12 | Discharge: 2013-12-12 | Disposition: A | Discharge status: Home / Self Care | Payer: Managed Care, Other (non HMO) | Attending: Emergency Medicine | Admitting: Emergency Medicine

## 2013-12-12 ENCOUNTER — Encounter (HOSPITAL_COMMUNITY): Payer: Self-pay | Admitting: Emergency Medicine

## 2013-12-12 DIAGNOSIS — N898 Other specified noninflammatory disorders of vagina: Secondary | ICD-10-CM | POA: Insufficient documentation

## 2013-12-12 DIAGNOSIS — Z3202 Encounter for pregnancy test, result negative: Secondary | ICD-10-CM | POA: Insufficient documentation

## 2013-12-12 DIAGNOSIS — Z8659 Personal history of other mental and behavioral disorders: Secondary | ICD-10-CM | POA: Insufficient documentation

## 2013-12-12 DIAGNOSIS — R109 Unspecified abdominal pain: Secondary | ICD-10-CM | POA: Insufficient documentation

## 2013-12-12 DIAGNOSIS — R1030 Lower abdominal pain, unspecified: Secondary | ICD-10-CM

## 2013-12-12 DIAGNOSIS — Z8619 Personal history of other infectious and parasitic diseases: Secondary | ICD-10-CM | POA: Insufficient documentation

## 2013-12-12 DIAGNOSIS — IMO0002 Reserved for concepts with insufficient information to code with codable children: Secondary | ICD-10-CM | POA: Insufficient documentation

## 2013-12-12 DIAGNOSIS — J45909 Unspecified asthma, uncomplicated: Secondary | ICD-10-CM | POA: Insufficient documentation

## 2013-12-12 DIAGNOSIS — Z79899 Other long term (current) drug therapy: Secondary | ICD-10-CM | POA: Insufficient documentation

## 2013-12-12 DIAGNOSIS — F17200 Nicotine dependence, unspecified, uncomplicated: Secondary | ICD-10-CM | POA: Insufficient documentation

## 2013-12-12 LAB — URINALYSIS, ROUTINE W REFLEX MICROSCOPIC
Bilirubin Urine: NEGATIVE
GLUCOSE, UA: NEGATIVE mg/dL
Ketones, ur: NEGATIVE mg/dL
LEUKOCYTES UA: NEGATIVE
Nitrite: NEGATIVE
Specific Gravity, Urine: 1.025 (ref 1.005–1.030)
UROBILINOGEN UA: 0.2 mg/dL (ref 0.0–1.0)
pH: 6 (ref 5.0–8.0)

## 2013-12-12 LAB — CBC WITH DIFFERENTIAL/PLATELET
Basophils Absolute: 0 10*3/uL (ref 0.0–0.1)
Basophils Relative: 0 % (ref 0–1)
Eosinophils Absolute: 0.1 10*3/uL (ref 0.0–0.7)
Eosinophils Relative: 2 % (ref 0–5)
HCT: 37.9 % (ref 36.0–46.0)
Hemoglobin: 13 g/dL (ref 12.0–15.0)
Lymphocytes Relative: 50 % — ABNORMAL HIGH (ref 12–46)
Lymphs Abs: 3 10*3/uL (ref 0.7–4.0)
MCH: 31.2 pg (ref 26.0–34.0)
MCHC: 34.3 g/dL (ref 30.0–36.0)
MCV: 90.9 fL (ref 78.0–100.0)
Monocytes Absolute: 0.5 10*3/uL (ref 0.1–1.0)
Monocytes Relative: 8 % (ref 3–12)
Neutro Abs: 2.5 10*3/uL (ref 1.7–7.7)
Neutrophils Relative %: 40 % — ABNORMAL LOW (ref 43–77)
Platelets: 209 10*3/uL (ref 150–400)
RBC: 4.17 MIL/uL (ref 3.87–5.11)
RDW: 15.7 % — ABNORMAL HIGH (ref 11.5–15.5)
WBC: 6.1 10*3/uL (ref 4.0–10.5)

## 2013-12-12 LAB — BASIC METABOLIC PANEL
BUN: 11 mg/dL (ref 6–23)
CO2: 28 mEq/L (ref 19–32)
Calcium: 8.9 mg/dL (ref 8.4–10.5)
Chloride: 103 mEq/L (ref 96–112)
Creatinine, Ser: 0.76 mg/dL (ref 0.50–1.10)
GFR calc Af Amer: >90 mL/min (ref >90)
GFR calc non Af Amer: >90 mL/min (ref >90)
Glucose, Bld: 78 mg/dL (ref 70–99)
Potassium: 4.4 mEq/L (ref 3.7–5.3)
Sodium: 140 mEq/L (ref 137–147)

## 2013-12-12 LAB — PREGNANCY, URINE: PREG TEST UR: NEGATIVE

## 2013-12-12 LAB — URINE MICROSCOPIC-ADD ON

## 2013-12-12 MED ORDER — IOHEXOL 300 MG/ML  SOLN
50.0000 mL | Freq: Once | INTRAMUSCULAR | Status: AC | PRN
  Administered 2013-12-12: 50 mL via ORAL

## 2013-12-12 MED ORDER — ONDANSETRON HCL 4 MG/2ML IJ SOLN
4.0000 mg | Freq: Once | INTRAMUSCULAR | Status: AC
Start: 2013-12-12 — End: 2013-12-12
  Administered 2013-12-12: 4 mg via INTRAVENOUS

## 2013-12-12 MED ORDER — ONDANSETRON HCL 4 MG/2ML IJ SOLN
4.0000 mg | Freq: Once | INTRAMUSCULAR | Status: DC
Start: 2013-12-12 — End: 2013-12-12

## 2013-12-12 MED ORDER — OXYCODONE-ACETAMINOPHEN 5-325 MG PO TABS: 1.0000 | ORAL_TABLET | ORAL | Status: DC | PRN

## 2013-12-12 MED ORDER — KETOROLAC TROMETHAMINE 30 MG/ML IJ SOLN
15.0000 mg | Freq: Once | INTRAMUSCULAR | Status: AC
  Administered 2013-12-12: 15 mg via INTRAVENOUS

## 2013-12-12 MED ORDER — SODIUM CHLORIDE 0.9 % IV BOLUS (SEPSIS)
1000.0000 mL | Freq: Once | INTRAVENOUS | Status: AC
  Administered 2013-12-12: 1000 mL via INTRAVENOUS

## 2013-12-12 MED ORDER — LORAZEPAM 2 MG/ML IJ SOLN
1.0000 mg | Freq: Once | INTRAMUSCULAR | Status: AC
  Administered 2013-12-12: 1 mg via INTRAVENOUS

## 2013-12-12 MED ORDER — HYDROMORPHONE HCL PF 1 MG/ML IJ SOLN
1.0000 mg | Freq: Once | INTRAMUSCULAR | Status: AC
  Administered 2013-12-12: 1 mg via INTRAVENOUS
  Filled 2013-12-12: qty 1

## 2013-12-12 MED ORDER — IOHEXOL 300 MG/ML  SOLN
100.0000 mL | Freq: Once | INTRAMUSCULAR | Status: AC | PRN
  Administered 2013-12-12: 100 mL via INTRAVENOUS

## 2013-12-12 MED ORDER — MORPHINE SULFATE 4 MG/ML IJ SOLN
4.0000 mg | Freq: Once | INTRAMUSCULAR | Status: AC
  Administered 2013-12-12: 4 mg via INTRAVENOUS

## 2013-12-12 NOTE — ED Notes (Signed)
Pt c/o sharp lower abdominal and pelvic pain x3 days as well as vaginal bleeding since pain began. Pt states she had IUD place 3-4 months ago and has had "mild pain since". Pt states the pain worsened 3 days ago.

## 2013-12-12 NOTE — Discharge Instructions (Signed)

## 2013-12-12 NOTE — Telephone Encounter (Signed)
Pt called in stating she has increased vaginal bleeding, cramping, and bilateral abd pain. I adv pt to be evaluated at [facility]. Pt states [facility] not have [facility] is about the same distance from her house as [facility]. Pt asked if she could be evaluated at an Urgent Care in [place] which is closest to her. I adv pt to go to Urgent Care to see what they say and if, after their exam, thinks she needs to be treated at ER then they may provide some type of transportation, possibly by ambulance. Since the [facility], I stressed to pt that she needs to be evaluated somewhere if pain is as severe as she states. Pt expressed understanding.

## 2013-12-12 NOTE — Telephone Encounter (Signed)
Rcvd fax from [facility] stating unexpected vaginal bleeding with clots which started 12/10/13. Pt has IUD which was placed by Dr. Debroah LoopArnold on 10/03/13 and pt has has not had a cycle since IUD was placed. Fax states pt c/o increasing pain on both sides of abd with 6/10 pain scale and abd cramping. Pt was adv to go to ER for eval but pt didn't have transportation or child care. I tried to call pt at [phone] this morning to check on her since there was no phone number in the system that was correct. Telephone encounter by me yesterday 12/11/13 states I tried to call pt and her emergency contacts but phone numbers are not up to date in the system and when pt left message at [facility] yesterday, Clydie BraunKaren at front desk states the number that was given was not in service. The fax from [facility] gives the impression that the pt is upset that she didn't get a return call yesterday from [facility] staff but her demographics will need to be updated as soon as possible to ensure this does not happen in the future. Pt will need to contact me at [phone] if there is a clinical question as I have been unable to reach her by phone.

## 2013-12-14 ENCOUNTER — Inpatient Hospital Stay (HOSPITAL_COMMUNITY)
Admission: AD | Admit: 2013-12-14 | Discharge: 2013-12-14 | Disposition: A | Discharge status: Home / Self Care | Payer: Managed Care, Other (non HMO) | Source: Ambulatory Visit | Attending: Obstetrics & Gynecology | Admitting: Obstetrics & Gynecology

## 2013-12-14 ENCOUNTER — Encounter (HOSPITAL_COMMUNITY): Payer: Self-pay | Admitting: *Deleted

## 2013-12-14 DIAGNOSIS — R102 Pelvic and perineal pain: Secondary | ICD-10-CM

## 2013-12-14 DIAGNOSIS — Z30432 Encounter for removal of intrauterine contraceptive device: Secondary | ICD-10-CM | POA: Insufficient documentation

## 2013-12-14 DIAGNOSIS — N949 Unspecified condition associated with female genital organs and menstrual cycle: Secondary | ICD-10-CM | POA: Insufficient documentation

## 2013-12-14 DIAGNOSIS — F17200 Nicotine dependence, unspecified, uncomplicated: Secondary | ICD-10-CM | POA: Insufficient documentation

## 2013-12-14 DIAGNOSIS — R109 Unspecified abdominal pain: Secondary | ICD-10-CM | POA: Insufficient documentation

## 2013-12-14 LAB — RAPID URINE DRUG SCREEN, HOSP PERFORMED
Amphetamines: NOT DETECTED
BARBITURATES: NOT DETECTED
BENZODIAZEPINES: NOT DETECTED
Cocaine: NOT DETECTED
Opiates: NOT DETECTED
TETRAHYDROCANNABINOL: POSITIVE — AB

## 2013-12-14 LAB — URINE MICROSCOPIC-ADD ON

## 2013-12-14 LAB — URINALYSIS, ROUTINE W REFLEX MICROSCOPIC
Bilirubin Urine: NEGATIVE
Glucose, UA: NEGATIVE mg/dL
KETONES UR: NEGATIVE mg/dL
Nitrite: NEGATIVE
PROTEIN: NEGATIVE mg/dL
Specific Gravity, Urine: 1.015 (ref 1.005–1.030)
UROBILINOGEN UA: 0.2 mg/dL (ref 0.0–1.0)
pH: 7 (ref 5.0–8.0)

## 2013-12-14 LAB — WET PREP, GENITAL
Trich, Wet Prep: NONE SEEN
Yeast Wet Prep HPF POC: NONE SEEN

## 2013-12-14 MED ORDER — KETOROLAC TROMETHAMINE 60 MG/2ML IM SOLN
60.0000 mg | Freq: Once | INTRAMUSCULAR | Status: AC
  Administered 2013-12-14: 60 mg via INTRAMUSCULAR
  Filled 2013-12-14: qty 2
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
## 2013-12-14 NOTE — MAU Note (Signed)
Patient states she [facility]. States she started having abdominal pain and vaginal bleeding 4 days ago. Went to [facility] 2 days ago for evaluation.

## 2013-12-15 LAB — GC/CHLAMYDIA PROBE AMP
CT Probe RNA: NEGATIVE
GC Probe RNA: NEGATIVE
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
## 2013-12-28 ENCOUNTER — Telehealth: Payer: Self-pay | Admitting: Obstetrics & Gynecology

## 2013-12-29 NOTE — Telephone Encounter (Signed)
Called pt about IUD - she states she had IUD removed and wants tubal - will set appt for her to come in to sign forms

## 2014-01-02 ENCOUNTER — Ambulatory Visit: Payer: Managed Care, Other (non HMO) | Admitting: Obstetrics & Gynecology

## 2014-01-09 ENCOUNTER — Ambulatory Visit (INDEPENDENT_AMBULATORY_CARE_PROVIDER_SITE_OTHER): Payer: Managed Care, Other (non HMO) | Admitting: Obstetrics & Gynecology

## 2014-01-09 ENCOUNTER — Encounter: Payer: Self-pay | Admitting: Obstetrics & Gynecology

## 2014-01-09 VITALS — BP 119/73 | HR 74 | Resp 16 | Ht 66.0 in | Wt 143.0 lb

## 2014-01-09 DIAGNOSIS — Z3009 Encounter for other general counseling and advice on contraception: Secondary | ICD-10-CM

## 2014-01-09 MED ORDER — VALACYCLOVIR HCL 500 MG PO TABS: 500.0000 mg | ORAL_TABLET | Freq: Every day | ORAL | Status: DC

## 2014-01-09 NOTE — Progress Notes (Signed)
Patient ID: Amy Pham, female   DOB: 08/01/1990, 23 y.o.   MRN: [id number] Patient's last menstrual period was 01/05/2014. Wants to schedule sterilization. Advised to abstain or at least use condoms and spermacide until procedure. The procedure and the risk of anesthesia, bleeding, infection, bowel and bladder injury, failure (1/200) and ectopic pregnancy were discussed and her questions were answered. The procedure will be scheduled as an outpatient. She signed Medicaid consent today.  Adam PhenixJames G Terrilyn Tyner, MD 01/09/2014

## 2014-01-09 NOTE — Patient Instructions (Signed)
Laparoscopic Tubal Ligation Laparoscopic tubal ligation is a procedure that closes the fallopian tubes at a time other than right after childbirth. By closing the fallopian tubes, the eggs that are released from the ovaries cannot enter the uterus and sperm cannot reach the egg. Tubal ligation is also known as getting your "tubes tied." Tubal ligation is done so you will not be able to get pregnant or have a baby.  Although this procedure may be reversed, it should be considered permanent and irreversible. If you want to have future pregnancies, you should not have this procedure.  LET YOUR CAREGIVER KNOW ABOUT:  Allergies to food or medicine.  Medicines taken, including vitamins, herbs, eyedrops, over-the-counter medicines, and creams.  Use of steroids (by mouth or creams).  Previous problems with numbing medicines.  History of bleeding problems or blood clots.  Any recent colds or infections.  Previous surgery.  Other health problems, including diabetes and kidney problems.  Possibility of pregnancy, if this applies.  Any past pregnancies. RISKS AND COMPLICATIONS   Infection.  Bleeding.  Injury to surrounding organs.  Anesthetic side effects.  Failure of the procedure.  Ectopic pregnancy.  Future regret about having the procedure done. BEFORE THE PROCEDURE  Do not take aspirin or blood thinners a week before the procedure or as directed. This can cause bleeding.  Do not eat or drink anything 6 to 8 hours before the procedure. PROCEDURE   You may be given a medicine to help you relax (sedative) before the procedure. You will be given a medicine to make you sleep (general anesthetic) during the procedure.  A tube will be put down your throat to help your breath while under general anesthesia.  Two small cuts (incisions) are made in the lower abdominal area and near the belly button.  Your abdominal area will be inflated with a safe gas (carbon dioxide). This helps  give the surgeon room to operate, visualize, and helps the surgeon avoid other organs.  A thin, lighted tube (laparoscope) with a camera attached is inserted into your abdomen through one of the incisions near the belly button. Other small instruments are also inserted through the other abdominal incision.  The fallopian tubes are located and are either blocked with a ring, clip, or are burned (cauterized).  After the fallopian tubes are blocked, the gas is released from the abdomen.  The incisions will be closed with stitches (sutures), and a bandage may be placed over the incisions. AFTER THE PROCEDURE   You will rest in a recovery room for 1--4 hours until you are stable and doing well.  You will also have some mild abdominal discomfort for 3--7 days. You will be given pain medicine to ease any discomfort.  As long as there are no problems, you may be allowed to go home. Someone will need to drive you home and be with you for at least 24 hours once home.  You may have some mild discomfort in the throat. This is from the tube placed in your throat while you were sleeping.  You may experience discomfort in the shoulder area from some trapped air between the liver and diaphragm. This sensation is normal and will slowly go away on its own. Document Released: 09/14/2000 Document Revised: 12/08/2011 Document Reviewed: 09/19/2011 ExitCare Patient Information 2015 [employer]. This information is not intended to replace advice given to you by your health care provider. Make sure you discuss any questions you have with your health care provider.  

## 2014-01-10 ENCOUNTER — Encounter: Payer: Self-pay | Admitting: *Deleted

## 2014-01-25 ENCOUNTER — Other Ambulatory Visit: Payer: Self-pay | Admitting: Obstetrics & Gynecology

## 2014-02-12 NOTE — Patient Instructions (Signed)
   Your procedure is scheduled on:  Wednesday, September 1  Enter through the [employer] of [facility] at:  730 AM Pick up the phone at the desk and dial [phone] and inform us of your arrival.  Please call this number if you have any problems the morning of surgery: [phone]  Remember: Do not eat or drink after midnight: Tuesday Take these medicines the morning of surgery with a SIP OF WATER:  Do not wear jewelry, make-up, or FINGER nail polish No metal in your hair or on your body. Do not wear lotions, powders, perfumes.  You may wear deodorant.  Do not bring valuables to the hospital. Contacts, dentures or bridgework may not be worn into surgery.  Patients discharged on the day of surgery will not be allowed to drive home.

## 2014-02-13 ENCOUNTER — Inpatient Hospital Stay (HOSPITAL_COMMUNITY)
Admission: RE | Admit: 2014-02-13 | Discharge: 2014-02-13 | Disposition: A | Discharge status: Home / Self Care | Payer: Managed Care, Other (non HMO) | Source: Ambulatory Visit

## 2014-02-14 ENCOUNTER — Encounter (HOSPITAL_COMMUNITY)
Admission: RE | Admit: 2014-02-14 | Discharge: 2014-02-14 | Disposition: A | Discharge status: Home / Self Care | Payer: Managed Care, Other (non HMO) | Source: Ambulatory Visit | Attending: Obstetrics & Gynecology | Admitting: Obstetrics & Gynecology

## 2014-02-14 ENCOUNTER — Encounter (HOSPITAL_COMMUNITY): Payer: Self-pay

## 2014-02-14 DIAGNOSIS — Z309 Encounter for contraceptive management, unspecified: Secondary | ICD-10-CM | POA: Diagnosis not present

## 2014-02-14 DIAGNOSIS — Z01818 Encounter for other preprocedural examination: Secondary | ICD-10-CM | POA: Insufficient documentation

## 2014-02-14 HISTORY — DX: Adverse effect of unspecified anesthetic, initial encounter: T41.45XA

## 2014-02-14 HISTORY — DX: Other complications of anesthesia, initial encounter: T88.59XA

## 2014-02-14 LAB — CBC
HEMATOCRIT: 40.9 % (ref 36.0–46.0)
Hemoglobin: 14.1 g/dL (ref 12.0–15.0)
MCH: 32.3 pg (ref 26.0–34.0)
MCHC: 34.5 g/dL (ref 30.0–36.0)
MCV: 93.6 fL (ref 78.0–100.0)
Platelets: 156 10*3/uL (ref 150–400)
RBC: 4.37 MIL/uL (ref 3.87–5.11)
RDW: 13.8 % (ref 11.5–15.5)
WBC: 3.9 10*3/uL — AB (ref 4.0–10.5)

## 2014-02-14 LAB — HEPATITIS PANEL, ACUTE
HCV Ab: NEGATIVE
HEP B C IGM: NONREACTIVE
HEP B S AG: NEGATIVE
Hep A IgM: NONREACTIVE

## 2014-02-14 NOTE — Patient Instructions (Addendum)
Your procedure is scheduled on:02/21/14  Enter through the Main Entrance at :0730 Pick up desk phone and dial [phone] and inform us of your arrival.  Please call [phone] if you have any problems the morning of surgery.  Remember: Do not eat food or drink liquids, including water, after midnight:Tuesday   You may brush your teeth the morning of surgery.  Take these meds the morning of surgery with a sip of water: bring inhaler to hospital  DO NOT wear jewelry, eye make-up, lipstick,body lotion, or dark fingernail polish.  (Polished toes are ok) You may wear deodorant.  If you are to be admitted after surgery, leave suitcase in car until your room has been assigned. Patients discharged on the day of surgery will not be allowed to drive home. Wear loose fitting, comfortable clothes for your ride home.

## 2014-02-15 ENCOUNTER — Encounter (HOSPITAL_COMMUNITY): Payer: Self-pay | Admitting: Pharmacy Technician

## 2014-02-21 ENCOUNTER — Encounter (HOSPITAL_COMMUNITY): Payer: Managed Care, Other (non HMO) | Admitting: Anesthesiology

## 2014-02-21 ENCOUNTER — Encounter (HOSPITAL_COMMUNITY): Payer: Self-pay | Admitting: Certified Registered"

## 2014-02-21 ENCOUNTER — Ambulatory Visit (HOSPITAL_COMMUNITY): Payer: Managed Care, Other (non HMO) | Admitting: Anesthesiology

## 2014-02-21 ENCOUNTER — Encounter (HOSPITAL_COMMUNITY)
Admission: RE | Disposition: A | Discharge status: Home / Self Care | Payer: Self-pay | Source: Ambulatory Visit | Attending: Obstetrics & Gynecology

## 2014-02-21 ENCOUNTER — Ambulatory Visit (HOSPITAL_COMMUNITY)
Admission: RE | Admit: 2014-02-21 | Discharge: 2014-02-21 | Disposition: A | Discharge status: Home / Self Care | Payer: Managed Care, Other (non HMO) | Source: Ambulatory Visit | Attending: Obstetrics & Gynecology | Admitting: Obstetrics & Gynecology

## 2014-02-21 DIAGNOSIS — F17200 Nicotine dependence, unspecified, uncomplicated: Secondary | ICD-10-CM | POA: Insufficient documentation

## 2014-02-21 DIAGNOSIS — Z302 Encounter for sterilization: Secondary | ICD-10-CM

## 2014-02-21 HISTORY — PX: LAPAROSCOPIC TUBAL LIGATION: SHX1937

## 2014-02-21 LAB — PREGNANCY, URINE: PREG TEST UR: NEGATIVE

## 2014-02-21 SURGERY — LIGATION, FALLOPIAN TUBE, LAPAROSCOPIC
Anesthesia: General | Site: Abdomen | Laterality: Bilateral

## 2014-02-21 MED ORDER — OXYCODONE-ACETAMINOPHEN 5-325 MG PO TABS
ORAL_TABLET | ORAL | Status: AC
  Administered 2014-02-21: 1 via ORAL
  Filled 2014-02-21: qty 1

## 2014-02-21 MED ORDER — ONDANSETRON HCL 4 MG/2ML IJ SOLN
INTRAMUSCULAR | Status: DC | PRN
  Administered 2014-02-21: 4 mg via INTRAVENOUS

## 2014-02-21 MED ORDER — KETOROLAC TROMETHAMINE 30 MG/ML IJ SOLN
INTRAMUSCULAR | Status: DC | PRN
  Administered 2014-02-21: 30 mg via INTRAVENOUS

## 2014-02-21 MED ORDER — OXYCODONE-ACETAMINOPHEN 5-325 MG PO TABS
1.0000 | ORAL_TABLET | ORAL | Status: DC | PRN
  Administered 2014-02-21: 1 via ORAL

## 2014-02-21 MED ORDER — FENTANYL CITRATE 0.05 MG/ML IJ SOLN
INTRAMUSCULAR | Status: DC | PRN
  Administered 2014-02-21 (×3): 50 ug via INTRAVENOUS

## 2014-02-21 MED ORDER — NEOSTIGMINE METHYLSULFATE 10 MG/10ML IV SOLN
INTRAVENOUS | Status: AC
  Filled 2014-02-21: qty 1

## 2014-02-21 MED ORDER — GLYCOPYRROLATE 0.2 MG/ML IJ SOLN
INTRAMUSCULAR | Status: AC
  Filled 2014-02-21: qty 3

## 2014-02-21 MED ORDER — DEXAMETHASONE SODIUM PHOSPHATE 10 MG/ML IJ SOLN
INTRAMUSCULAR | Status: DC | PRN
  Administered 2014-02-21: 4 mg via INTRAVENOUS

## 2014-02-21 MED ORDER — KETOROLAC TROMETHAMINE 30 MG/ML IJ SOLN
INTRAMUSCULAR | Status: AC
  Filled 2014-02-21: qty 1

## 2014-02-21 MED ORDER — HYDROCODONE-ACETAMINOPHEN 5-300 MG PO TABS: 1.0000 | ORAL_TABLET | Freq: Four times a day (QID) | ORAL | Status: DC | PRN

## 2014-02-21 MED ORDER — PROMETHAZINE HCL 25 MG/ML IJ SOLN: 6.2500 mg | INTRAMUSCULAR | Status: DC | PRN

## 2014-02-21 MED ORDER — GLYCOPYRROLATE 0.2 MG/ML IJ SOLN
INTRAMUSCULAR | Status: DC | PRN
  Administered 2014-02-21: 0.6 mg via INTRAVENOUS

## 2014-02-21 MED ORDER — MIDAZOLAM HCL 2 MG/2ML IJ SOLN: 0.5000 mg | Freq: Once | INTRAMUSCULAR | Status: DC | PRN

## 2014-02-21 MED ORDER — DIPHENHYDRAMINE HCL 50 MG/ML IJ SOLN
INTRAMUSCULAR | Status: DC | PRN
  Administered 2014-02-21: 12.5 mg via INTRAVENOUS

## 2014-02-21 MED ORDER — MIDAZOLAM HCL 2 MG/2ML IJ SOLN
INTRAMUSCULAR | Status: DC | PRN
  Administered 2014-02-21: 2 mg via INTRAVENOUS

## 2014-02-21 MED ORDER — FENTANYL CITRATE 0.05 MG/ML IJ SOLN
INTRAMUSCULAR | Status: AC
  Filled 2014-02-21: qty 2

## 2014-02-21 MED ORDER — MIDAZOLAM HCL 2 MG/2ML IJ SOLN
INTRAMUSCULAR | Status: AC
  Filled 2014-02-21: qty 2

## 2014-02-21 MED ORDER — IBUPROFEN 600 MG PO TABS: 600.0000 mg | ORAL_TABLET | Freq: Four times a day (QID) | ORAL | Status: DC | PRN

## 2014-02-21 MED ORDER — PROPOFOL 10 MG/ML IV BOLUS
INTRAVENOUS | Status: DC | PRN
  Administered 2014-02-21: 150 mg via INTRAVENOUS

## 2014-02-21 MED ORDER — ROCURONIUM BROMIDE 100 MG/10ML IV SOLN
INTRAVENOUS | Status: DC | PRN
  Administered 2014-02-21: 30 mg via INTRAVENOUS

## 2014-02-21 MED ORDER — ROCURONIUM BROMIDE 100 MG/10ML IV SOLN
INTRAVENOUS | Status: AC
  Filled 2014-02-21: qty 1

## 2014-02-21 MED ORDER — KETOROLAC TROMETHAMINE 30 MG/ML IJ SOLN: 15.0000 mg | Freq: Once | INTRAMUSCULAR | Status: DC | PRN

## 2014-02-21 MED ORDER — SCOPOLAMINE 1 MG/3DAYS TD PT72
MEDICATED_PATCH | TRANSDERMAL | Status: AC
  Administered 2014-02-21: 1.5 mg via TRANSDERMAL
  Filled 2014-02-21: qty 1

## 2014-02-21 MED ORDER — LACTATED RINGERS IV SOLN
INTRAVENOUS | Status: DC
  Administered 2014-02-21 (×2): via INTRAVENOUS

## 2014-02-21 MED ORDER — PROPOFOL 10 MG/ML IV EMUL
INTRAVENOUS | Status: AC
  Filled 2014-02-21: qty 20

## 2014-02-21 MED ORDER — LIDOCAINE HCL (CARDIAC) 20 MG/ML IV SOLN
INTRAVENOUS | Status: AC
  Filled 2014-02-21: qty 5

## 2014-02-21 MED ORDER — MEPERIDINE HCL 25 MG/ML IJ SOLN: 6.2500 mg | INTRAMUSCULAR | Status: DC | PRN

## 2014-02-21 MED ORDER — LACTATED RINGERS IV SOLN
INTRAVENOUS | Status: DC
  Administered 2014-02-21: 08:00:00 via INTRAVENOUS

## 2014-02-21 MED ORDER — ONDANSETRON HCL 4 MG/2ML IJ SOLN
INTRAMUSCULAR | Status: AC
  Filled 2014-02-21: qty 2

## 2014-02-21 MED ORDER — BUPIVACAINE HCL (PF) 0.25 % IJ SOLN
INTRAMUSCULAR | Status: AC
  Filled 2014-02-21: qty 20

## 2014-02-21 MED ORDER — BUPIVACAINE HCL (PF) 0.25 % IJ SOLN
INTRAMUSCULAR | Status: DC | PRN
  Administered 2014-02-21: 10 mL

## 2014-02-21 MED ORDER — SCOPOLAMINE 1 MG/3DAYS TD PT72
1.0000 | MEDICATED_PATCH | Freq: Once | TRANSDERMAL | Status: DC
  Administered 2014-02-21: 1.5 mg via TRANSDERMAL

## 2014-02-21 MED ORDER — DEXAMETHASONE SODIUM PHOSPHATE 10 MG/ML IJ SOLN
INTRAMUSCULAR | Status: AC
  Filled 2014-02-21: qty 1

## 2014-02-21 MED ORDER — NEOSTIGMINE METHYLSULFATE 10 MG/10ML IV SOLN
INTRAVENOUS | Status: DC | PRN
  Administered 2014-02-21: 3 mg via INTRAVENOUS

## 2014-02-21 MED ORDER — FENTANYL CITRATE 0.05 MG/ML IJ SOLN
INTRAMUSCULAR | Status: AC
  Filled 2014-02-21: qty 5

## 2014-02-21 MED ORDER — LIDOCAINE HCL (CARDIAC) 20 MG/ML IV SOLN
INTRAVENOUS | Status: DC | PRN
  Administered 2014-02-21: 80 mg via INTRAVENOUS

## 2014-02-21 MED ORDER — FENTANYL CITRATE 0.05 MG/ML IJ SOLN
25.0000 ug | INTRAMUSCULAR | Status: DC | PRN
  Administered 2014-02-21: 50 ug via INTRAVENOUS

## 2014-02-21 SURGICAL SUPPLY — 21 items
BENZOIN TINCTURE PRP APPL 2/3 (GAUZE/BANDAGES/DRESSINGS) IMPLANT
CATH ROBINSON RED A/P 16FR (CATHETERS) ×3 IMPLANT
CHLORAPREP W/TINT 26ML (MISCELLANEOUS) ×3 IMPLANT
CLIP FILSHIE TUBAL LIGA STRL (Clip) ×3 IMPLANT
CLOTH BEACON ORANGE TIMEOUT ST (SAFETY) ×3 IMPLANT
DRSG COVADERM PLUS 2X2 (GAUZE/BANDAGES/DRESSINGS) ×6 IMPLANT
DRSG OPSITE POSTOP 3X4 (GAUZE/BANDAGES/DRESSINGS) ×3 IMPLANT
GLOVE BIO SURGEON STRL SZ 6.5 (GLOVE) ×2 IMPLANT
GLOVE BIO SURGEONS STRL SZ 6.5 (GLOVE) ×1
GLOVE BIOGEL PI IND STRL 7.0 (GLOVE) ×1 IMPLANT
GLOVE BIOGEL PI INDICATOR 7.0 (GLOVE) ×2
GOWN STRL REUS W/TWL LRG LVL3 (GOWN DISPOSABLE) ×6 IMPLANT
NEEDLE INSUFFLATION 120MM (ENDOMECHANICALS) ×3 IMPLANT
PACK LAPAROSCOPY BASIN (CUSTOM PROCEDURE TRAY) ×3 IMPLANT
SET IRRIG TUBING LAPAROSCOPIC (IRRIGATION / IRRIGATOR) IMPLANT
SUT VICRYL 0 UR6 27IN ABS (SUTURE) ×3 IMPLANT
SUT VICRYL 4-0 PS2 18IN ABS (SUTURE) ×3 IMPLANT
TOWEL OR 17X24 6PK STRL BLUE (TOWEL DISPOSABLE) ×6 IMPLANT
TROCAR XCEL DIL TIP R 11M (ENDOMECHANICALS) ×3 IMPLANT
WARMER LAPAROSCOPE (MISCELLANEOUS) ×3 IMPLANT
WATER STERILE IRR 1000ML POUR (IV SOLUTION) ×3 IMPLANT
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
## 2014-02-21 NOTE — Op Note (Addendum)
Diagnostic Laparoscopy Procedure Note  Indications: The patient is a 23 y.o. female with undesired fertility for tubal ligation.  Pre-operative Diagnosis: undesired fertility  Post-operative Diagnosis: same  Surgeon: Scheryl Darter   Assistants: none  Anesthesia: General endotracheal anesthesia  ASA Class: 1  Procedure Details  The patient was seen in the Holding Room. The risks, benefits, complications, treatment options, and expected outcomes were discussed with the patient. The possibilities of reaction to medication, pulmonary aspiration, perforation of viscus, bleeding, recurrent infection, the need for additional procedures, failure to diagnose a condition, and creating a complication requiring transfusion or operation were discussed with the patient. The patient concurred with the proposed plan, giving informed consent. The patient was taken to the Operating Room, identified as CARALINE DEUTSCHMAN and the procedure verified as laparoscopic bilateral tubal ligation. A Time Out was held and the above information confirmed.  After induction of general anesthesia, the patient was placed in modified dorsal lithotomy position where she was prepped, draped, and catheterized in the normal, sterile fashion.  The cervix was visualized and an intrauterine manipulator was placed. A 1.5 cm umbilical incision was then performed. Veress needle was passed and pneumoperitoneum was established. An 11 mm trocar was inserted and the operative laparoscope was introduced with video camera and use. The above findings were noted. The uterus tubes and ovaries appeared normal. Filshie clip was applied just proximal to the midportion to the right tube and good positioning was seen. Same was done left side. Quarter percent Marcaine was injected onto the tubes on each side. All instruments removed. Pneumoperitoneum was released. The fascia was closed with a interrupted suture with 0 Vicryl and skin was closed with  interrupted subcuticular sutures with 4-0 Vicryl in a sterile dressing was applied.  The intrauterine manipulator was then removed.  Instrument, sponge, and needle counts were correct prior to abdominal closure and at the conclusion of the case.   Findings: The anterior cul-de-sac and round ligaments normal The uterus normal The adnexa normal Cul-de-sac normal  Estimated Blood Loss:  Minimal         Drains: None. Bladder was drained with catheter         Total IV Fluids: 1000 mL          Specimens: None              Complications:  None; patient tolerated the procedure well.         Disposition: PACU - hemodynamically stable.         Condition: stable  Adam Phenix, MD 02/21/2014

## 2014-02-21 NOTE — Anesthesia Preprocedure Evaluation (Signed)
Anesthesia Evaluation  Patient identified by MRN, date of birth, ID band Patient awake    Reviewed: Allergy & Precautions, H&P , Patient's Chart, lab work & pertinent test results, reviewed documented beta blocker date and time   History of Anesthesia Complications Negative for: history of anesthetic complications  Airway Mallampati: II TM Distance: >3 FB Neck ROM: full    Dental   Pulmonary asthma , Current Smoker,  breath sounds clear to auscultation        Cardiovascular Exercise Tolerance: Good Rhythm:regular Rate:Normal     Neuro/Psych  Headaches,    GI/Hepatic   Endo/Other    Renal/GU      Musculoskeletal   Abdominal   Peds  Hematology   Anesthesia Other Findings   Reproductive/Obstetrics                           Anesthesia Physical Anesthesia Plan  ASA: II  Anesthesia Plan: General ETT   Post-op Pain Management:    Induction:   Airway Management Planned:   Additional Equipment:   Intra-op Plan:   Post-operative Plan:   Informed Consent: I have reviewed the patients History and Physical, chart, labs and discussed the procedure including the risks, benefits and alternatives for the proposed anesthesia with the patient or authorized representative who has indicated his/her understanding and acceptance.   Dental Advisory Given  Plan Discussed with: CRNA and Surgeon  Anesthesia Plan Comments:         Anesthesia Quick Evaluation Position awake

## 2014-02-21 NOTE — Transfer of Care (Signed)
Immediate Anesthesia Transfer of Care Note  Patient: Amy Pham  Procedure(s) Performed: Procedure(s): LAPAROSCOPIC TUBAL LIGATION (Bilateral)  Patient Location: PACU  Anesthesia Type:General  Level of Consciousness: awake, alert  and oriented  Airway & Oxygen Therapy: Patient Spontanous Breathing and Patient connected to nasal cannula oxygen  Post-op Assessment: Report given to PACU RN, Post -op Vital signs reviewed and stable and Patient moving all extremities  Post vital signs: Reviewed and stable  Complications: No apparent anesthesia complications

## 2014-02-21 NOTE — Discharge Instructions (Signed)
Laparoscopic Tubal Ligation °Care After °Refer to this sheet in the next few weeks. These instructions provide you with information on caring for yourself after your procedure. Your caregiver may also give you more specific instructions. Your treatment has been planned according to current medical practices, but problems sometimes occur. Call your caregiver if you have any problems or questions after your procedure. °HOME CARE INSTRUCTIONS  °· Rest the remainder of the day. °· Only take over-the-counter or prescription medicines for pain, discomfort, or fever as directed by your caregiver. Do not take aspirin. It can cause bleeding. °· Gradually resume daily activities, diet, rest, driving, and work. °· Avoid sexual intercourse for 2 weeks or as directed. °· Do not use tampons or douche. °· Do not drive while taking pain medicine. °· Do not lift anything over 5 pounds for 2 weeks or as directed. °· Only take showers, not baths, until you are seen by your caregiver. °· Change bandages (dressings) as directed. °· Take your temperature twice a day and record it. °· Try to have help for the first 7 to 10 days for your household needs. °· Return to your caregiver to get your stitches (sutures) removed and for follow-up visits as directed. °SEEK MEDICAL CARE IF:  °· You have redness, swelling, or increasing pain in a wound. °· You have drainage from a wound lasting longer than 1 day. °· Your pain is getting worse. °· You have a rash. °· You become dizzy or lightheaded. °· You have a reaction to your medicine. °· You need stronger medicine or a change in your pain medicine. °· You notice a bad smell coming from a wound or dressing. °· Your wound breaks open after the sutures have been removed. °· You are constipated. °SEEK IMMEDIATE MEDICAL CARE IF:  °· You faint. °· You have a fever. °· You have increasing abdominal pain. °· You have severe pain in your shoulders. °· You have bleeding or drainage from the suture sites or  vagina following surgery. °· You have shortness of breath or difficulty breathing. °· You have chest or leg pain. °· You have persistent nausea, vomiting, or diarrhea. °MAKE SURE YOU:  °· Understand these instructions. °· Watch your condition. °· Get help right away if you are not doing well or get worse. °Document Released: 12/26/2004 Document Revised: 12/08/2011 Document Reviewed: 09/19/2011 °ExitCare® Patient Information ©2015 [employer]. This information is not intended to replace advice given to you by your health care provider. Make sure you discuss any questions you have with your health care provider. ° °Post Anesthesia Home Care Instructions ° °Activity: °Get plenty of rest for the remainder of the day. A responsible adult should stay with you for 24 hours following the procedure.  °For the next 24 hours, DO NOT: °-Drive a car °-Operate machinery °-Drink alcoholic beverages °-Take any medication unless instructed by your physician °-Make any legal decisions or sign important papers. ° °Meals: °Start with liquid foods such as gelatin or soup. Progress to regular foods as tolerated. Avoid greasy, spicy, heavy foods. If nausea and/or vomiting occur, drink only clear liquids until the nausea and/or vomiting subsides. Call your physician if vomiting continues. ° °Special Instructions/Symptoms: °Your throat may feel dry or sore from the anesthesia or the breathing tube placed in your throat during surgery. If this causes discomfort, gargle with warm salt water. The discomfort should disappear within 24 hours. ° °

## 2014-02-21 NOTE — Anesthesia Postprocedure Evaluation (Signed)
  Anesthesia Post Note  Patient: Amy Pham  Procedure(s) Performed: Procedure(s) (LRB): LAPAROSCOPIC TUBAL LIGATION (Bilateral)  Anesthesia type: GA  Patient location: PACU  Post pain: Pain level controlled  Post assessment: Post-op Vital signs reviewed  Last Vitals:  Filed Vitals:   02/21/14 0946  BP: 128/77  Pulse:   Temp: 36.4 C  Resp: 14    Post vital signs: Reviewed  Level of consciousness: sedated  Complications: No apparent anesthesia complications

## 2014-02-21 NOTE — Addendum Note (Signed)
Addendum created 02/21/14 1159 by Turner Daniels, CRNA   Modules edited: Anesthesia Medication Administration

## 2014-02-22 ENCOUNTER — Encounter (HOSPITAL_COMMUNITY): Payer: Self-pay | Admitting: Obstetrics & Gynecology

## 2014-03-14 ENCOUNTER — Other Ambulatory Visit: Payer: Self-pay | Admitting: Family Medicine

## 2014-04-23 ENCOUNTER — Encounter (HOSPITAL_COMMUNITY): Payer: Self-pay | Admitting: Obstetrics & Gynecology

## 2014-07-23 DIAGNOSIS — L03811 Cellulitis of head [any part, except face]: Secondary | ICD-10-CM | POA: Insufficient documentation

## 2014-07-23 DIAGNOSIS — F329 Major depressive disorder, single episode, unspecified: Secondary | ICD-10-CM | POA: Insufficient documentation

## 2014-07-23 DIAGNOSIS — F419 Anxiety disorder, unspecified: Secondary | ICD-10-CM | POA: Insufficient documentation

## 2014-07-23 DIAGNOSIS — F32A Depression, unspecified: Secondary | ICD-10-CM | POA: Insufficient documentation

## 2015-01-26 ENCOUNTER — Other Ambulatory Visit: Payer: Self-pay | Admitting: Family Medicine

## 2016-01-17 ENCOUNTER — Ambulatory Visit (INDEPENDENT_AMBULATORY_CARE_PROVIDER_SITE_OTHER): Payer: Medicaid Other | Admitting: Nurse Practitioner

## 2016-01-17 ENCOUNTER — Encounter: Payer: Self-pay | Admitting: Nurse Practitioner

## 2016-01-17 VITALS — BP 101/72 | HR 81 | Temp 97.9°F | Ht 66.0 in | Wt 138.0 lb

## 2016-01-17 DIAGNOSIS — J4540 Moderate persistent asthma, uncomplicated: Secondary | ICD-10-CM | POA: Diagnosis not present

## 2016-01-17 DIAGNOSIS — F1120 Opioid dependence, uncomplicated: Secondary | ICD-10-CM

## 2016-01-17 MED ORDER — BUDESONIDE-FORMOTEROL FUMARATE 80-4.5 MCG/ACT IN AERO: 2.0000 | INHALATION_SPRAY | Freq: Every day | RESPIRATORY_TRACT | 9 refills | Status: DC

## 2016-01-17 MED ORDER — ALBUTEROL SULFATE HFA 108 (90 BASE) MCG/ACT IN AERS
2.0000 | INHALATION_SPRAY | Freq: Four times a day (QID) | RESPIRATORY_TRACT | 1 refills | Status: DC | PRN
Start: 2016-01-17 — End: 2017-01-21

## 2016-01-17 NOTE — Progress Notes (Signed)
   Subjective:    Patient ID: Amy Pham, female    DOB: [birth year], 25 y.o.   MRN: [id number]  HPI  Patient in today to reestablish care. Had to get a new Doctor for [facility]. She is doing well right  Now. Patient has history of opoid abuse and needs to be referred to saboxin and he will not see her anymore because she owes office to much money. She had relapse about 1 week ago and had some cocaine. SHe smokes marijuanna on a regular basis. Has been to rehab 4x in the last several years. Asthma symbicort daily- works well to keep asthma under control.  Review of Systems  Constitutional: Negative.   Respiratory: Negative.   Cardiovascular: Negative.   Gastrointestinal: Negative.   Genitourinary: Negative.   Neurological: Negative.   Psychiatric/Behavioral: Negative.   All other systems reviewed and are negative.      Objective:   Physical Exam  Constitutional: She is oriented to person, place, and time. She appears well-developed and well-nourished. No distress.  Cardiovascular: Normal rate, regular rhythm and normal heart sounds.   Pulmonary/Chest: Effort normal and breath sounds normal.  Neurological: She is alert and oriented to person, place, and time.  Skin: Skin is warm.  Multiple scabbed lesions on bil forearms.  Psychiatric: She has a normal mood and affect. Her behavior is normal. Judgment and thought content normal.   BP 101/72   Pulse 81   Temp 97.9 F (36.6 C) (Oral)   Ht 5\' 6"  (1.676 m)   Wt 138 lb (62.6 kg)   BMI 22.27 kg/m         Assessment & Plan:  1. 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 Asthma, moderate persistent, uncomplicated Continue symbicort as rx Meds ordered this encounter  Medications         . budesonide-formoterol (SYMBICORT) 80-4.5 MCG/ACT inhaler    Sig: Inhale 2 puffs into the lungs daily.    Dispense:  10.2 g    Refill:  9    Order Specific Question:    Supervising Provider    Answer:   VINCENT, CAROL L [[id number]]  . albuterol (PROVENTIL HFA;VENTOLIN HFA) 108 (90 Base) MCG/ACT inhaler    Sig: Inhale 2 puffs into the lungs every 6 (six) hours as needed for wheezing or shortness of breath.    Dispense:  18 g    Refill:  1    Order Specific Question:   Supervising Provider    Answer:   Johna Sheriff [[id number]]    RTO prn  Mary-Margaret Daphine Deutscher, FNP

## 2016-02-05 ENCOUNTER — Ambulatory Visit (INDEPENDENT_AMBULATORY_CARE_PROVIDER_SITE_OTHER): Payer: Medicaid Other | Admitting: Family Medicine

## 2016-02-05 ENCOUNTER — Encounter: Payer: Self-pay | Admitting: Family Medicine

## 2016-02-05 VITALS — BP 106/74 | Temp 100.1°F | Ht 66.0 in | Wt 143.4 lb

## 2016-02-05 DIAGNOSIS — K5901 Slow transit constipation: Secondary | ICD-10-CM

## 2016-02-05 DIAGNOSIS — B372 Candidiasis of skin and nail: Secondary | ICD-10-CM

## 2016-02-05 DIAGNOSIS — J029 Acute pharyngitis, unspecified: Secondary | ICD-10-CM

## 2016-02-05 DIAGNOSIS — J020 Streptococcal pharyngitis: Secondary | ICD-10-CM

## 2016-02-05 LAB — RAPID STREP SCREEN (MED CTR MEBANE ONLY): STREP GP A AG, IA W/REFLEX: POSITIVE — AB

## 2016-02-05 MED ORDER — AMOXICILLIN-POT CLAVULANATE 875-125 MG PO TABS: 1.0000 | ORAL_TABLET | Freq: Two times a day (BID) | ORAL | 0 refills | Status: DC

## 2016-02-05 MED ORDER — FLUCONAZOLE 150 MG PO TABS: 150.0000 mg | ORAL_TABLET | Freq: Once | ORAL | 0 refills | Status: AC

## 2016-02-05 MED ORDER — MAGNESIUM CITRATE PO SOLN
1.0000 | Freq: Once | ORAL | 2 refills | Status: AC
Start: 2016-02-05 — End: 2016-02-05

## 2016-02-05 MED ORDER — POLYETHYLENE GLYCOL 3350 17 GM/SCOOP PO POWD: 17.0000 g | Freq: Two times a day (BID) | ORAL | 1 refills | Status: DC | PRN

## 2016-02-05 NOTE — Progress Notes (Signed)
Subjective:  Patient ID: Amy Pham, female    DOB: 12/05/1990  Age: 25 y.o. MRN: [id number]  CC: Sore Throat (x 1.5 days, headache)   HPI Amy Pham presents for Patient presents with upper respiratory congestion. Rhinorrhea that is frequently purulent. There is moderate sore throat. Patient reports coughing frequently as well.-colored/purulent sputum noted. There is no fever no chills no sweats. The patient denies being short of breath. Onset was 3-5 days ago. Gradually worsening in spite of home remedies. Abd bloated. Self treated UTI. Now constipated. No relief with miralax so far after 4 days. LLQ pain.    History Amy Pham has a past medical history of Anxiety; Asthma; Back pain; Complication of anesthesia; Depression; Headache(784.0); and HSV (herpes simplex virus) infection.   She has a past surgical history that includes Tonsillectomy; Cesarean section (05/31/2011); Cesarean section (N/A, 07/19/2013); and Laparoscopic tubal ligation (Bilateral, 02/21/2014).   Her family history includes Arthritis in her maternal grandmother; Cancer in her maternal grandmother; Depression in her mother; Diabetes in her maternal uncle; Heart disease in her paternal grandfather; Mental illness in her father.She reports that she has been smoking Cigarettes.  She has been smoking about 1.00 pack per day. She has never used smokeless tobacco. She reports that she uses drugs, including Marijuana and Other-see comments. She reports that she does not drink alcohol.    ROS Review of Systems  Constitutional: Negative for activity change, appetite change, chills and fever.  HENT: Positive for congestion, postnasal drip, rhinorrhea and sinus pressure. Negative for ear discharge, ear pain, hearing loss, nosebleeds, sneezing and trouble swallowing.   Respiratory: Negative for chest tightness and shortness of breath.   Cardiovascular: Negative for chest pain and palpitations.  Skin: Positive for rash  (painful yeast at groin due to recent abx use.).    Objective:  BP 106/74 (BP Location: Left Arm, Patient Position: Sitting, Cuff Size: Normal)   Temp 100.1 F (37.8 C) (Oral)   Ht 5\' 6"  (1.676 m)   Wt 143 lb 6.4 oz (65 kg)   LMP 01/13/2016 (Approximate)   SpO2 98%   BMI 23.15 kg/m   BP Readings from Last 3 Encounters:  02/05/16 106/74  01/17/16 101/72  02/21/14 103/77    Wt Readings from Last 3 Encounters:  02/05/16 143 lb 6.4 oz (65 kg)  01/17/16 138 lb (62.6 kg)  02/14/14 142 lb (64.4 kg)     Physical Exam  Constitutional: She appears well-developed and well-nourished.  HENT:  Head: Normocephalic and atraumatic.  Right Ear: Tympanic membrane and external ear normal. No decreased hearing is noted.  Left Ear: Tympanic membrane and external ear normal. No decreased hearing is noted.  Nose: Mucosal edema present. Right sinus exhibits no frontal sinus tenderness. Left sinus exhibits no frontal sinus tenderness.  Mouth/Throat: No oropharyngeal exudate or posterior oropharyngeal erythema.  Neck: No Brudzinski's sign noted.  Pulmonary/Chest: Breath sounds normal. No respiratory distress.  Abdominal: Soft. Normal appearance. She exhibits distension. There is tenderness in the left lower quadrant.  Palpable rounded tubular loop of distended colon noted. LLQ   Lymphadenopathy:       Head (right side): No preauricular adenopathy present.       Head (left side): No preauricular adenopathy present.       Right cervical: No superficial cervical adenopathy present.      Left cervical: No superficial cervical adenopathy present.      Lab Results  Component Value Date   WBC 3.9 (L) 02/14/2014  HGB 14.1 02/14/2014   HCT 40.9 02/14/2014   PLT 156 02/14/2014   GLUCOSE 78 12/12/2013   NA 140 12/12/2013   K 4.4 12/12/2013   CL 103 12/12/2013   CREATININE 0.76 12/12/2013   BUN 11 12/12/2013   CO2 28 12/12/2013   INR 1.12 06/23/2013    No results found.  Assessment &  Plan:   Amy Pham was seen today for sore throat.  Diagnoses and all orders for this visit:  Sore throat -     Rapid strep screen ([facility])  Pharyngitis, streptococcal, acute  Yeast dermatitis  Slow transit constipation  Other orders -     magnesium citrate SOLN; Take 296 mLs (1 Bottle total) by mouth once. Daily as needed for constipation -     polyethylene glycol powder (GLYCOLAX/MIRALAX) powder; Take 17 g by mouth 2 (two) times daily as needed for moderate constipation. -     amoxicillin-clavulanate (AUGMENTIN) 875-125 MG tablet; Take 1 tablet by mouth 2 (two) times daily. Take all of this medication -     fluconazole (DIFLUCAN) 150 MG tablet; Take 1 tablet (150 mg total) by mouth once. At onset of symptoms. Repeat at end of treatment    I am having Amy Pham start on magnesium citrate, polyethylene glycol powder, amoxicillin-clavulanate, and fluconazole. I am also having her maintain her Aspirin-Acetaminophen-Caffeine (GOODY HEADACHE PO), valACYclovir, Cyanocobalamin (VITAMIN B-12 PO), Buprenorphine HCl-Naloxone HCl, budesonide-formoterol, and albuterol.  Meds ordered this encounter  Medications  . magnesium citrate SOLN    Sig: Take 296 mLs (1 Bottle total) by mouth once. Daily as needed for constipation    Dispense:  296 mL    Refill:  2  . polyethylene glycol powder (GLYCOLAX/MIRALAX) powder    Sig: Take 17 g by mouth 2 (two) times daily as needed for moderate constipation.    Dispense:  3350 g    Refill:  1  . amoxicillin-clavulanate (AUGMENTIN) 875-125 MG tablet    Sig: Take 1 tablet by mouth 2 (two) times daily. Take all of this medication    Dispense:  20 tablet    Refill:  0  . fluconazole (DIFLUCAN) 150 MG tablet    Sig: Take 1 tablet (150 mg total) by mouth once. At onset of symptoms. Repeat at end of treatment    Dispense:  2 tablet    Refill:  0     Follow-up: Return if symptoms worsen or fail to improve.  Mechele ClaudeWarren Perina Salvaggio, M.D.

## 2016-03-18 ENCOUNTER — Ambulatory Visit: Payer: Medicaid Other | Admitting: Family Medicine

## 2016-03-19 ENCOUNTER — Ambulatory Visit (INDEPENDENT_AMBULATORY_CARE_PROVIDER_SITE_OTHER): Payer: Medicaid Other | Admitting: Family Medicine

## 2016-03-19 ENCOUNTER — Encounter: Payer: Self-pay | Admitting: Family Medicine

## 2016-03-19 VITALS — BP 97/63 | HR 92 | Temp 99.1°F | Ht 66.0 in | Wt 137.0 lb

## 2016-03-19 DIAGNOSIS — R300 Dysuria: Secondary | ICD-10-CM | POA: Diagnosis not present

## 2016-03-19 LAB — URINALYSIS, COMPLETE
BILIRUBIN UA: NEGATIVE
Glucose, UA: NEGATIVE
KETONES UA: NEGATIVE
Leukocytes, UA: NEGATIVE
Nitrite, UA: NEGATIVE
PH UA: 5.5 (ref 5.0–7.5)
PROTEIN UA: NEGATIVE
RBC UA: NEGATIVE
Specific Gravity, UA: 1.02 (ref 1.005–1.030)
UUROB: 0.2 mg/dL (ref 0.2–1.0)

## 2016-03-19 LAB — MICROSCOPIC EXAMINATION
BACTERIA UA: NONE SEEN
RBC, UA: NONE SEEN /hpf (ref 0–?)

## 2016-03-19 MED ORDER — FLUCONAZOLE 150 MG PO TABS: 150.0000 mg | ORAL_TABLET | Freq: Once | ORAL | 0 refills | Status: AC

## 2016-03-19 MED ORDER — CIPROFLOXACIN HCL 250 MG PO TABS: 250.0000 mg | ORAL_TABLET | Freq: Two times a day (BID) | ORAL | 0 refills | Status: DC

## 2016-03-19 NOTE — Progress Notes (Signed)
   HPI  Patient presents today with concern for UTI.  Patient states she's had burning with urination for 3 weeks, she's also had lower abdominal pain and stable back pain. She states she waited so long because she had a yeast infection which she treated over-the-counter, she knew that yeast infections might get worse with antibiotics.  She denies fever, chills, sweats, or any other associated symptoms.  PMH: Smoking status noted ROS: Per HPI  Objective: BP 97/63   Pulse 92   Temp 99.1 F (37.3 C) (Oral)   Ht 5\' 6"  (1.676 m)   Wt 137 lb (62.1 kg)   BMI 22.11 kg/m  Gen: NAD, alert, cooperative with exam HEENT: NCAT CV: RRR, good S1/S2, no murmur Resp: CTABL, no wheezes, non-labored Abd: SNTND, BS present, no guarding or organomegaly, no CVA tenderness Ext: No edema, warm Neuro: Alert and oriented  Assessment and plan:  # Dysuria Urinalysis is not consistent with UTI today, however given dysuria 3 weeks I'll cover with Cipro. With recent subjective yeast infection it's possible that she has external urethritis from yeast. Cover with Diflucan. Discussed that if her symptoms do not improve, we will have the follow-up culture and be able to say definitively whether she had an infection or not. She will return if not improving as expected.  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      Orders Placed This Encounter  Procedures  . Urine culture  . Urinalysis, Complete    Meds ordered this encounter  Medications  . ciprofloxacin (CIPRO) 250 MG tablet    Sig: Take 1 tablet (250 mg total) by mouth 2 (two) times daily.    Dispense:  6 tablet    Refill:  0  . fluconazole (DIFLUCAN) 150 MG tablet    Sig: Take 1 tablet (150 mg total) by mouth once. Repeat in 3 days if symptoms un-resolved, for yeast    Dispense:  2 tablet    Refill:  0     Murtis SinkSam Finneus Kaneshiro, MD [facility] 03/19/2016, 8:50 AM

## 2016-03-19 NOTE — Patient Instructions (Signed)
Great to meet you!  Your urine studies today do not show severe tract infection, however given your symptoms I have treated you with ciprofloxacin for a UTI.  I have also sent urine culture so that we can be sure there is no infection if your symptoms return or do not improve.   Urinary Tract Infection Urinary tract infections (UTIs) can develop anywhere along your urinary tract. Your urinary tract is your body's drainage system for removing wastes and extra water. Your urinary tract includes two kidneys, two ureters, a bladder, and a urethra. Your kidneys are a pair of bean-shaped organs. Each kidney is about the size of your fist. They are located below your ribs, one on each side of your spine. CAUSES Infections are caused by microbes, which are microscopic organisms, including fungi, viruses, and bacteria. These organisms are so small that they can only be seen through a microscope. Bacteria are the microbes that most commonly cause UTIs. SYMPTOMS  Symptoms of UTIs may vary by age and gender of the patient and by the location of the infection. Symptoms in young women typically include a frequent and intense urge to urinate and a painful, burning feeling in the bladder or urethra during urination. Older women and men are more likely to be tired, shaky, and weak and have muscle aches and abdominal pain. A fever may mean the infection is in your kidneys. Other symptoms of a kidney infection include pain in your back or sides below the ribs, nausea, and vomiting. DIAGNOSIS To diagnose a UTI, your caregiver will ask you about your symptoms. Your caregiver will also ask you to provide a urine sample. The urine sample will be tested for bacteria and white blood cells. White blood cells are made by your body to help fight infection. TREATMENT  Typically, UTIs can be treated with medication. Because most UTIs are caused by a bacterial infection, they usually can be treated with the use of antibiotics. The  choice of antibiotic and length of treatment depend on your symptoms and the type of bacteria causing your infection. HOME CARE INSTRUCTIONS  If you were prescribed antibiotics, take them exactly as your caregiver instructs you. Finish the medication even if you feel better after you have only taken some of the medication.  Drink enough water and fluids to keep your urine clear or pale yellow.  Avoid caffeine, tea, and carbonated beverages. They tend to irritate your bladder.  Empty your bladder often. Avoid holding urine for long periods of time.  Empty your bladder before and after sexual intercourse.  After a bowel movement, women should cleanse from front to back. Use each tissue only once. SEEK MEDICAL CARE IF:   You have back pain.  You develop a fever.  Your symptoms do not begin to resolve within 3 days. SEEK IMMEDIATE MEDICAL CARE IF:   You have severe back pain or lower abdominal pain.  You develop chills.  You have nausea or vomiting.  You have continued burning or discomfort with urination. MAKE SURE YOU:   Understand these instructions.  Will watch your condition.  Will get help right away if you are not doing well or get worse.   This information is not intended to replace advice given to you by your health care provider. Make sure you discuss any questions you have with your health care provider.   Document Released: 03/18/2005 Document Revised: 02/27/2015 Document Reviewed: 07/17/2011 Elsevier Interactive Patient Education [employer]2016 [employer]

## 2016-03-21 LAB — URINE CULTURE

## 2016-03-23 NOTE — Progress Notes (Signed)
Patient aware.

## 2016-03-24 ENCOUNTER — Ambulatory Visit (INDEPENDENT_AMBULATORY_CARE_PROVIDER_SITE_OTHER): Payer: Medicaid Other | Admitting: Family Medicine

## 2016-03-24 ENCOUNTER — Encounter: Payer: Self-pay | Admitting: Family Medicine

## 2016-03-24 ENCOUNTER — Telehealth: Payer: Self-pay | Admitting: Family Medicine

## 2016-03-24 VITALS — BP 101/71 | HR 106 | Ht 66.0 in | Wt 137.2 lb

## 2016-03-24 DIAGNOSIS — R399 Unspecified symptoms and signs involving the genitourinary system: Secondary | ICD-10-CM | POA: Diagnosis not present

## 2016-03-24 DIAGNOSIS — N760 Acute vaginitis: Secondary | ICD-10-CM | POA: Diagnosis not present

## 2016-03-24 DIAGNOSIS — R300 Dysuria: Secondary | ICD-10-CM | POA: Diagnosis not present

## 2016-03-24 DIAGNOSIS — R1030 Lower abdominal pain, unspecified: Secondary | ICD-10-CM | POA: Diagnosis not present

## 2016-03-24 DIAGNOSIS — F1120 Opioid dependence, uncomplicated: Secondary | ICD-10-CM | POA: Diagnosis not present

## 2016-03-24 DIAGNOSIS — M5442 Lumbago with sciatica, left side: Secondary | ICD-10-CM | POA: Diagnosis not present

## 2016-03-24 DIAGNOSIS — B9689 Other specified bacterial agents as the cause of diseases classified elsewhere: Secondary | ICD-10-CM

## 2016-03-24 LAB — URINALYSIS, COMPLETE
Bilirubin, UA: NEGATIVE
Glucose, UA: NEGATIVE
LEUKOCYTES UA: NEGATIVE
Nitrite, UA: NEGATIVE
RBC, UA: NEGATIVE
UUROB: 1 mg/dL (ref 0.2–1.0)
pH, UA: 5.5 (ref 5.0–7.5)

## 2016-03-24 LAB — MICROSCOPIC EXAMINATION: BACTERIA UA: NONE SEEN

## 2016-03-24 LAB — WET PREP FOR TRICH, YEAST, CLUE
Clue Cell Exam: POSITIVE — AB
TRICHOMONAS EXAM: NEGATIVE
Yeast Exam: NEGATIVE

## 2016-03-24 MED ORDER — METRONIDAZOLE 500 MG PO TABS: 500.0000 mg | ORAL_TABLET | Freq: Two times a day (BID) | ORAL | 0 refills | Status: DC

## 2016-03-24 NOTE — Progress Notes (Signed)
Subjective:  Patient ID: Amy Pham, female    DOB: 26-Jan-1991  Age: 25 y.o. MRN: [id number]  CC: Flank Pain (pt here today for flank pain and she has one more Cipro left for tonight but she doesn't feel like she has had any relief)   HPI Amy Pham presents for Pain in both flank regions. She points to the lumbar past by his musculature primarily on the left. She's had no nausea or vomiting. However, she says the pain is 9-1/2 out of 10. She did have to bend and lift a heavy object a few days ago. She is currently on a trial of Suboxone for her opiate dependence and she is changing physicians for her pain as well. She has had burning with urination with onset of symptoms 2-3 days ago. It has been increasing significantly. Flank pain started at the same time. She states that her partner's semen smells very strong. She has had no discharge.   History Jeana has a past medical history of Anxiety; Asthma; Back pain; Complication of anesthesia; Depression; Headache(784.0); and HSV (herpes simplex virus) infection.   She has a past surgical history that includes Tonsillectomy; Cesarean section (05/31/2011); Cesarean section (N/A, 07/19/2013); and Laparoscopic tubal ligation (Bilateral, 02/21/2014).   Her family history includes Arthritis in her maternal grandmother; Cancer in her maternal grandmother; Depression in her mother; Diabetes in her maternal uncle; Heart disease in her paternal grandfather; Mental illness in her father.She reports that she has been smoking Cigarettes.  She has been smoking about 1.00 pack per day. She has never used smokeless tobacco. She reports that she uses drugs, including Marijuana and Other-see comments. She reports that she does not drink alcohol.    ROS Review of Systems  Constitutional: Negative for chills, diaphoresis and fever.  HENT: Negative for congestion.   Eyes: Negative for visual disturbance.  Respiratory: Negative for cough and  shortness of breath.   Cardiovascular: Negative for chest pain and palpitations.  Gastrointestinal: Negative for constipation, diarrhea and nausea.  Genitourinary: Positive for dysuria, frequency and urgency. Negative for decreased urine volume, flank pain, hematuria, menstrual problem and pelvic pain.  Musculoskeletal: Positive for back pain. Negative for arthralgias and joint swelling.  Skin: Negative for rash.  Neurological: Negative for dizziness and numbness.    Objective:  BP 101/71   Pulse (!) 106   Ht 5\' 6"  (1.676 m)   Wt 137 lb 4 oz (62.3 kg)   LMP 03/10/2016 (Approximate)   BMI 22.15 kg/m   BP Readings from Last 3 Encounters:  03/24/16 101/71  03/19/16 97/63  02/05/16 106/74    Wt Readings from Last 3 Encounters:  03/24/16 137 lb 4 oz (62.3 kg)  03/19/16 137 lb (62.1 kg)  02/05/16 143 lb 6.4 oz (65 kg)     Physical Exam  Constitutional: She is oriented to person, place, and time. She appears well-developed and well-nourished. No distress.  HENT:  Head: Normocephalic and atraumatic.  Eyes: Conjunctivae are normal. Pupils are equal, round, and reactive to light.  Neck: Normal range of motion. Neck supple. No thyromegaly present.  Cardiovascular: Normal rate, regular rhythm and normal heart sounds.   No murmur heard. Pulmonary/Chest: Effort normal and breath sounds normal. No respiratory distress. She has no wheezes. She has no rales.  Abdominal: Soft. Bowel sounds are normal. She exhibits no distension and no mass. There is tenderness (minimal - BLQ). There is no rebound and no guarding.  Musculoskeletal: Normal range of motion. She exhibits  tenderness (l2-4 paraspinal musculature).  Lymphadenopathy:    She has no cervical adenopathy.  Neurological: She is alert and oriented to person, place, and time.  Skin: Skin is warm and dry.  Psychiatric: She has a normal mood and affect. Her behavior is normal. Judgment and thought content normal.     Lab Results    Component Value Date   WBC 3.9 (L) 02/14/2014   HGB 14.1 02/14/2014   HCT 40.9 02/14/2014   PLT 156 02/14/2014   GLUCOSE 78 12/12/2013   NA 140 12/12/2013   K 4.4 12/12/2013   CL 103 12/12/2013   CREATININE 0.76 12/12/2013   BUN 11 12/12/2013   CO2 28 12/12/2013   INR 1.12 06/23/2013    No results found.  Assessment & Plan:   Sharyl NimrodMeredith was seen today for flank pain.  Diagnoses and all orders for this visit:  UTI symptoms -     Urinalysis, Complete  Dysuria -     WET PREP FOR TRICH, YEAST, CLUE  Opioid dependence on agonist therapy (HCC)  Acute bilateral low back pain with left-sided sciatica  Lower abdominal pain -     WET PREP FOR TRICH, YEAST, CLUE  BV (bacterial vaginosis)  Other orders -     metroNIDAZOLE (FLAGYL) 500 MG tablet; Take 1 tablet (500 mg total) by mouth 2 (two) times daily. -     Microscopic Examination    I have discontinued Ms. Breau's ciprofloxacin. I am also having her start on metroNIDAZOLE. Additionally, I am having her maintain her valACYclovir, Buprenorphine HCl-Naloxone HCl, budesonide-formoterol, albuterol, and polyethylene glycol powder.  Meds ordered this encounter  Medications  . metroNIDAZOLE (FLAGYL) 500 MG tablet    Sig: Take 1 tablet (500 mg total) by mouth 2 (two) times daily.    Dispense:  14 tablet    Refill:  0     Follow-up: Return if symptoms worsen or fail to improve.  Mechele ClaudeWarren Pluma Diniz, M.D.

## 2016-03-24 NOTE — Telephone Encounter (Signed)
Pt notified of results Verbalizes understanding 

## 2016-03-25 ENCOUNTER — Encounter: Payer: Self-pay | Admitting: Family Medicine

## 2016-03-25 ENCOUNTER — Ambulatory Visit (INDEPENDENT_AMBULATORY_CARE_PROVIDER_SITE_OTHER): Payer: Medicaid Other | Admitting: Family Medicine

## 2016-03-25 VITALS — BP 107/76 | HR 99 | Temp 97.5°F | Ht 66.0 in | Wt 135.0 lb

## 2016-03-25 DIAGNOSIS — K5903 Drug induced constipation: Secondary | ICD-10-CM

## 2016-03-25 DIAGNOSIS — R1030 Lower abdominal pain, unspecified: Secondary | ICD-10-CM | POA: Diagnosis not present

## 2016-03-25 MED ORDER — LINACLOTIDE 290 MCG PO CAPS: 290.0000 ug | ORAL_CAPSULE | Freq: Every day | ORAL | 2 refills | Status: DC

## 2016-03-25 NOTE — Progress Notes (Signed)
Subjective:  Patient ID: Amy Pham, female    DOB: 01/17/1991  Age: 25 y.o. MRN: [id number]  CC: check for hook worms   HPI Amy Pham presents for Evaluation for lower abdominal pain. She was in yesterday and treated for UTI. She feels somewhat better but the pain persists. Meanwhile her children have both been diagnosed with hook worms through the emergency room. She brought in a stool specimen showing that it had OF eggs [place] it. She would like to have evaluated for hook worms. She denies any diarrhea. In fact she's been constipated for 4 days. On the other hand, she takes Suboxone for her opiate dependency and her back pain. The back pain continues it is better when she is active. She is seeing a pain management specialist to adjust her Suboxone in approximately 2 more weeks. She says she switching from 1 pain management doctor to the other right now but Medicaid has her locked into the wrong one. She is rather vague about who she seeing and when.  History Amy Pham has a past medical history of Anxiety; Asthma; Back pain; Complication of anesthesia; Depression; Headache(784.0); and HSV (herpes simplex virus) infection.   She has a past surgical history that includes Tonsillectomy; Cesarean section (05/31/2011); Cesarean section (N/A, 07/19/2013); and Laparoscopic tubal ligation (Bilateral, 02/21/2014).   Her family history includes Arthritis in her maternal grandmother; Cancer in her maternal grandmother; Depression in her mother; Diabetes in her maternal uncle; Heart disease in her paternal grandfather; Mental illness in her father.She reports that she has been smoking Cigarettes.  She has been smoking about 1.00 pack per day. She has never used smokeless tobacco. She reports that she uses drugs, including Marijuana and Other-see comments. She reports that she does not drink alcohol.    ROS Review of Systems  Constitutional: Negative for activity change and fever.  HENT:  Negative for congestion, rhinorrhea and sore throat.   Respiratory: Negative for cough.   Cardiovascular: Negative for chest pain.  Gastrointestinal: Positive for abdominal pain (BLQ, unchanged). Negative for diarrhea and nausea.  Genitourinary: Negative for dysuria.  Musculoskeletal: Positive for arthralgias, back pain and myalgias.    Objective:  BP 107/76   Pulse 99   Temp 97.5 F (36.4 C) (Oral)   Ht 5\' 6"  (1.676 m)   Wt 135 lb (61.2 kg)   LMP 03/10/2016 (Approximate)   BMI 21.79 kg/m   BP Readings from Last 3 Encounters:  03/25/16 107/76  03/24/16 101/71  03/19/16 97/63    Wt Readings from Last 3 Encounters:  03/25/16 135 lb (61.2 kg)  03/24/16 137 lb 4 oz (62.3 kg)  03/19/16 137 lb (62.1 kg)     Physical Exam  Constitutional: She is oriented to person, place, and time. She appears well-developed and well-nourished.  HENT:  Head: Normocephalic and atraumatic.  Cardiovascular: Normal rate and regular rhythm.   No murmur heard. Pulmonary/Chest: Effort normal and breath sounds normal.  Abdominal: Soft. Bowel sounds are normal. She exhibits no mass. There is tenderness. There is no rebound and no guarding.  Neurological: She is alert and oriented to person, place, and time.  Skin: Skin is warm and dry.  Psychiatric: Her mood appears anxious. Her speech is rapid and/or pressured. She is agitated. She expresses impulsivity.     Lab Results  Component Value Date   WBC 3.9 (L) 02/14/2014   HGB 14.1 02/14/2014   HCT 40.9 02/14/2014   PLT 156 02/14/2014   GLUCOSE 78 12/12/2013  NA 140 12/12/2013   K 4.4 12/12/2013   CL 103 12/12/2013   CREATININE 0.76 12/12/2013   BUN 11 12/12/2013   CO2 28 12/12/2013   INR 1.12 06/23/2013    No results found.  Assessment & Plan:   [name] was seen today for check for hook worms.  Diagnoses and all orders for this visit:  Lower abdominal pain -     O+P Exam, Formalin Only -     O+P Exam, PVA Only  Drug-induced  constipation  Other orders -     linaclotide (LINZESS) 290 MCG CAPS capsule; Take 1 capsule (290 mcg total) by mouth daily.      I am having Amy Pham start on linaclotide. I am also having her maintain her valACYclovir, Buprenorphine HCl-Naloxone HCl, budesonide-formoterol, albuterol, polyethylene glycol powder, and metroNIDAZOLE.  Meds ordered this encounter  Medications  . linaclotide (LINZESS) 290 MCG CAPS capsule    Sig: Take 1 capsule (290 mcg total) by mouth daily.    Dispense:  30 capsule    Refill:  2     Follow-up: Return in about 3 months (around 06/25/2016).  Mechele Claude, M.D.

## 2016-03-30 ENCOUNTER — Encounter: Payer: Self-pay | Admitting: Nurse Practitioner

## 2016-03-30 ENCOUNTER — Ambulatory Visit: Payer: Medicaid Other | Admitting: Family Medicine

## 2016-04-16 ENCOUNTER — Telehealth: Payer: Self-pay | Admitting: Nurse Practitioner

## 2016-04-17 MED ORDER — VALACYCLOVIR HCL 500 MG PO TABS: 500.0000 mg | ORAL_TABLET | Freq: Every day | ORAL | 1 refills | Status: DC

## 2016-04-17 NOTE — Telephone Encounter (Signed)
Valtrex refill sent to pharmacy. 

## 2016-05-07 ENCOUNTER — Encounter: Payer: Self-pay | Admitting: Family Medicine

## 2016-05-07 ENCOUNTER — Ambulatory Visit (INDEPENDENT_AMBULATORY_CARE_PROVIDER_SITE_OTHER): Payer: Medicaid Other | Admitting: Family Medicine

## 2016-05-07 VITALS — BP 105/62 | HR 93 | Temp 97.5°F | Ht 66.0 in | Wt 130.6 lb

## 2016-05-07 DIAGNOSIS — R300 Dysuria: Secondary | ICD-10-CM | POA: Diagnosis not present

## 2016-05-07 DIAGNOSIS — Z113 Encounter for screening for infections with a predominantly sexual mode of transmission: Secondary | ICD-10-CM

## 2016-05-07 LAB — URINALYSIS, COMPLETE
Bilirubin, UA: NEGATIVE
Glucose, UA: NEGATIVE
Ketones, UA: NEGATIVE
Nitrite, UA: NEGATIVE
PH UA: 5 (ref 5.0–7.5)
Protein, UA: NEGATIVE
RBC, UA: NEGATIVE
Specific Gravity, UA: 1.03 (ref 1.005–1.030)
UUROB: 0.2 mg/dL (ref 0.2–1.0)

## 2016-05-07 LAB — MICROSCOPIC EXAMINATION
BACTERIA UA: NONE SEEN
Renal Epithel, UA: NONE SEEN /hpf

## 2016-05-07 NOTE — Progress Notes (Signed)
   HPI  Patient presents today here with concern for STI or UTI.  Patient states that over the last week or so she's had dysuria at the end of her urinary stream. She's had several UTIs previously and states the symptoms are different. She does state that she also had chlamydia once and this could be similar symptoms to that.  She's had 3 partners in the last 6 months, using condoms intermittently.  She denies any concern for pregnancy given that her tubes are tied.  She has no abdominal pain, vaginal discharge, or vaginal itching. She denies any perineal lesions.  No fever, sweats, or difficulty tolerance foods or fluids. She has had some mild chills over the last 2-3 days.  PMH: Smoking status noted ROS: Per HPI  Objective: BP 105/62   Pulse 93   Temp 97.5 F (36.4 C) (Oral)   Ht 5\' 6"  (1.676 m)   Wt 130 lb 9.6 oz (59.2 kg)   BMI 21.08 kg/m  Gen: NAD, alert, cooperative with exam HEENT: NCAT CV: RRR, good S1/S2, no murmur Resp: CTABL, no wheezes, non-labored Abd: SNTND, BS present, no guarding or organomegaly Ext: No edema, warm Neuro: Alert and oriented, No gross deficits  Assessment and plan:  # dysuria No signs of urinary tract infection on exam or urinalysis today, UA was [facility]. Urine culture sent  # High risk sexual behavior Screening labs for STI's Patient has mild end stream dysuria, this is consistent with her previous chlamydial infection. If patient has persistent symptoms and labs are negative would recommend pelvic exam with direct sampling.    Orders Placed This Encounter  Procedures  . GC/Chlamydia Probe Amp  . Urine culture  . Urinalysis, Complete  . RPR  . HIV antibody     Murtis SinkSam Shanti Agresti, MD [facility] 05/07/2016, 4:18 PM

## 2016-05-07 NOTE — Patient Instructions (Signed)
Great to see you!  We will call as soon as labs are available.

## 2016-05-08 LAB — RPR: RPR: NONREACTIVE

## 2016-05-08 LAB — HIV ANTIBODY (ROUTINE TESTING W REFLEX): HIV SCREEN 4TH GENERATION: NONREACTIVE

## 2016-05-10 LAB — GC/CHLAMYDIA PROBE AMP
Chlamydia trachomatis, NAA: NEGATIVE
Neisseria gonorrhoeae by PCR: NEGATIVE

## 2016-05-10 LAB — URINE CULTURE

## 2016-05-11 ENCOUNTER — Other Ambulatory Visit: Payer: Self-pay | Admitting: Family Medicine

## 2016-05-11 MED ORDER — CIPROFLOXACIN HCL 250 MG PO TABS: 250.0000 mg | ORAL_TABLET | Freq: Two times a day (BID) | ORAL | 0 refills | Status: DC

## 2016-06-11 ENCOUNTER — Encounter: Payer: Self-pay | Admitting: *Deleted

## 2016-07-15 ENCOUNTER — Telehealth: Payer: Self-pay | Admitting: Nurse Practitioner

## 2016-08-07 ENCOUNTER — Telehealth: Payer: Self-pay | Admitting: Nurse Practitioner

## 2016-08-07 NOTE — Telephone Encounter (Signed)
MMM will get back to her next week

## 2016-08-10 NOTE — Telephone Encounter (Signed)
She smokes about 2 packs a day = she thinks she wants the patches

## 2016-08-10 NOTE — Telephone Encounter (Signed)
What does she want- nicotine patches or chantix?

## 2016-08-11 ENCOUNTER — Other Ambulatory Visit: Payer: Self-pay | Admitting: Nurse Practitioner

## 2016-08-11 MED ORDER — NICOTINE 21 MG/24HR TD PT24: 21.0000 mg | MEDICATED_PATCH | Freq: Every day | TRANSDERMAL | 0 refills | Status: DC

## 2016-08-11 NOTE — Telephone Encounter (Signed)
[facility]

## 2016-08-11 NOTE — Telephone Encounter (Signed)
nicoderm patches rx sent to to pharmacy

## 2016-08-18 ENCOUNTER — Telehealth: Payer: Self-pay | Admitting: Nurse Practitioner

## 2016-08-18 NOTE — Telephone Encounter (Signed)
lmtcb

## 2016-08-18 NOTE — Telephone Encounter (Signed)
Only thing I can ofer is wellbutrin-will help some- does she want to try that- has she tried nicorit gum?

## 2016-09-04 NOTE — Telephone Encounter (Signed)
Returned patient's phone call. Patient states that nicotine patch made her ill and moody so she quit using them but has went back to smoking.  Patient would like to try something different other that Chantix

## 2016-09-04 NOTE — Telephone Encounter (Signed)
Nothing else rx that will help- have tried nicorte gum?

## 2016-09-07 ENCOUNTER — Telehealth: Payer: Self-pay | Admitting: *Deleted

## 2016-09-07 NOTE — Telephone Encounter (Signed)
Opened in error

## 2016-09-09 NOTE — Telephone Encounter (Signed)
Nicotine patch patient says works but makes her irritable believes she just needs a lower dose and maybe add the wellbutrin to it as well.

## 2016-09-10 ENCOUNTER — Other Ambulatory Visit: Payer: Self-pay | Admitting: Nurse Practitioner

## 2016-09-10 MED ORDER — NICOTINE 14 MG/24HR TD PT24: 14.0000 mg | MEDICATED_PATCH | Freq: Every day | TRANSDERMAL | 0 refills | Status: DC

## 2016-09-10 MED ORDER — BUPROPION HCL ER (XL) 150 MG PO TB24
150.0000 mg | ORAL_TABLET | Freq: Every day | ORAL | 3 refills | Status: DC
Start: 2016-09-10 — End: 2017-01-21

## 2016-09-10 NOTE — Progress Notes (Unsigned)
Nicotine patch decreased to 14mg  and wellbutrin added- rxsent to pharmacy

## 2016-10-20 ENCOUNTER — Other Ambulatory Visit: Payer: Self-pay | Admitting: Nurse Practitioner

## 2016-12-31 ENCOUNTER — Ambulatory Visit: Payer: Medicaid Other | Admitting: Physician Assistant

## 2017-01-01 ENCOUNTER — Telehealth: Payer: Self-pay | Admitting: Nurse Practitioner

## 2017-01-01 ENCOUNTER — Encounter: Payer: Self-pay | Admitting: Nurse Practitioner

## 2017-01-01 NOTE — Telephone Encounter (Signed)
Detailed message left for patient to please call back if still needs to be seen and reschedule.

## 2017-01-05 ENCOUNTER — Telehealth: Payer: Self-pay | Admitting: Nurse Practitioner

## 2017-01-05 NOTE — Telephone Encounter (Signed)
Pt called and im unsure of what "nest" she is seeing - but I told her to call pest control and have the house looked at  She is aware if she needs "scabies treatment" she will need an appt.

## 2017-01-06 ENCOUNTER — Ambulatory Visit: Payer: Medicaid Other | Admitting: Nurse Practitioner

## 2017-01-07 ENCOUNTER — Ambulatory Visit (INDEPENDENT_AMBULATORY_CARE_PROVIDER_SITE_OTHER): Payer: Medicaid Other | Admitting: Nurse Practitioner

## 2017-01-07 ENCOUNTER — Encounter: Payer: Self-pay | Admitting: Nurse Practitioner

## 2017-01-07 ENCOUNTER — Telehealth: Payer: Self-pay | Admitting: Nurse Practitioner

## 2017-01-07 VITALS — BP 119/79 | HR 96 | Temp 97.1°F | Ht 66.0 in | Wt 131.0 lb

## 2017-01-07 DIAGNOSIS — W57XXXA Bitten or stung by nonvenomous insect and other nonvenomous arthropods, initial encounter: Secondary | ICD-10-CM | POA: Diagnosis not present

## 2017-01-07 DIAGNOSIS — R21 Rash and other nonspecific skin eruption: Secondary | ICD-10-CM

## 2017-01-07 MED ORDER — PERMETHRIN 1 % EX LOTN: 1.0000 "application " | TOPICAL_LOTION | Freq: Once | CUTANEOUS | 0 refills | Status: AC

## 2017-01-07 NOTE — Telephone Encounter (Signed)
Please advise 

## 2017-01-07 NOTE — Patient Instructions (Addendum)
Bedbugs Bedbugs are tiny bugs that live in and around beds. They stay hidden during the day, and they come out at night and bite. Bedbugs need blood to live and grow. Where are bedbugs found? Bedbugs can be found anywhere, whether a place is clean or dirty. They are most often found in places where many people come and go, such as hotels, shelters, dorms, and health care settings. It is also common for them to be found in homes where there are many birds or bats nearby. What are bedbug bites like? A bedbug bite leaves a small red bump with a darker red dot in the middle. The bump may appear soon after a person is bitten or a day or more later. Bedbug bites usually do not hurt, but they may itch. Most people do not need treatment for bedbug bites. The bumps usually go away on their own in a few days. How do I check for bedbugs? Bedbugs are reddish-brown, oval, and flat. They range in size from 1 mm to 7 mm and they cannot fly. Look for bedbugs in these places:  On mattresses, bed frames, headboards, and box springs.  On drapes and curtains in bedrooms.  Under carpeting in bedrooms.  Behind electrical outlets.  Behind any wallpaper that is peeling.  Inside luggage.  Also look for black or red spots or stains on or near the bed. Stains can come from bedbugs that have been crushed or from bedbug waste. What should I do if I find bedbugs? When Traveling If you find bedbugs while traveling, check all of your possessions carefully before you bring them into your home. Consider throwing away anything that has bedbugs on it. At Home If you find bedbugs at home, your bedroom may need to be treated by a pest control expert. You may also need to throw away mattresses or luggage. To help keep bedbugs from coming back, consider taking these actions:  Put a plastic cover over your mattress.  Wash your clothes and bedding in water that is hotter than 120F (48.9C) and dry them on a hot setting.  Bedbugs are killed by high temperatures.  Vacuum often around the bed and in all of the cracks and crevices where the bugs might hide.  Carefully check all used furniture, bedding, or clothes that you bring into your home.  Eliminate bird nests and bat roosts that are near your home.  In Your Bed If you find bedbugs in your bed, consider wearing pajamas that have long sleeves and pant legs. Bedbugs usually bite areas of the skin that are not covered. This information is not intended to replace advice given to you by your health care provider. Make sure you discuss any questions you have with your health care provider. Document Released: 07/11/2010 Document Revised: 11/14/2015 Document Reviewed: 06/04/2014 Elsevier Interactive Patient Education  2018 Elsevier Inc.  

## 2017-01-07 NOTE — Progress Notes (Signed)
   Subjective:    Patient ID: Amy Pham, female    DOB: 04/24/1991, 26 y.o.   MRN: [id number]  HPI Patient in the office for possible scabies infection.  Patient lives with her father, her kids, and her kids' father.  Patient states her father is a Chartered loss adjusterhoarder and that his home is infested.  Patient says her sister was in the office 4 days ago and was treated for scabies.  Patient is having itching in scalp and between fingers for about 1.5 weeks.  Patient states her kids are also effected.  She has tried soaking in Epsom salts, but this has not been effective.  Patient states her anxiety is heightened in dealing with all this and that she may need something to help with anxiety.   Review of Systems  Skin: Positive for rash.       Patient has multiple lesions across body.  All other systems reviewed and are negative.      Objective:   Physical Exam  Constitutional: She appears well-developed and well-nourished. No distress.  HENT:  Head: Normocephalic.  Cardiovascular: Normal rate, regular rhythm and normal heart sounds.   Pulmonary/Chest: Effort normal and breath sounds normal. No respiratory distress.  Skin: Skin is warm and dry. Lesion (multiple anular lesions on arms, shoulders, and scalp), purpura and rash noted. There is erythema.   BP 119/79   Pulse 96   Temp (!) 97.1 F (36.2 C) (Oral)   Ht 5\' 6"  (1.676 m)   Wt 131 lb (59.4 kg)   BMI 21.14 kg/m      Assessment & Plan:  1. Bug bite, initial encounter Patient insists she has scabies- because that is what sister was treated for and got better- Meds ordered this encounter  Medications  . permethrin (PERMETHRIN LICE TREATMENT) 1 % lotion    Sig: Apply 1 application topically once.    Dispense:  59 mL    Refill:  0    Order Specific Question:   Supervising Provider    Answer:   VINCENT, CAROL L [[id number]]   I suggested that she have exterminator come to home and lok at what worm looking things she is talking about.  Not a safe environment for her or her children Follow up prn  Mary-Margaret Daphine DeutscherMartin, FNP

## 2017-01-08 ENCOUNTER — Other Ambulatory Visit: Payer: Self-pay | Admitting: *Deleted

## 2017-01-08 ENCOUNTER — Telehealth: Payer: Self-pay | Admitting: Nurse Practitioner

## 2017-01-08 MED ORDER — ALBENDAZOLE 200 MG PO TABS
400.0000 mg | ORAL_TABLET | Freq: Two times a day (BID) | ORAL | 0 refills | Status: DC
Start: 2017-01-08 — End: 2017-01-08

## 2017-01-08 MED ORDER — ALBENDAZOLE 200 MG PO TABS: ORAL_TABLET | ORAL | 0 refills | Status: DC

## 2017-01-08 NOTE — Telephone Encounter (Signed)
[facility] [place]

## 2017-01-11 NOTE — Telephone Encounter (Signed)
Sent in rx on Friday to pharmacy

## 2017-01-20 ENCOUNTER — Encounter: Payer: Self-pay | Admitting: Family Medicine

## 2017-01-20 ENCOUNTER — Ambulatory Visit (INDEPENDENT_AMBULATORY_CARE_PROVIDER_SITE_OTHER): Payer: Medicaid Other | Admitting: Family Medicine

## 2017-01-20 ENCOUNTER — Other Ambulatory Visit: Payer: Self-pay | Admitting: Nurse Practitioner

## 2017-01-20 VITALS — BP 116/78 | HR 96 | Temp 98.4°F | Ht 66.0 in | Wt 132.0 lb

## 2017-01-20 DIAGNOSIS — F411 Generalized anxiety disorder: Secondary | ICD-10-CM

## 2017-01-20 DIAGNOSIS — J4540 Moderate persistent asthma, uncomplicated: Secondary | ICD-10-CM

## 2017-01-20 MED ORDER — VENLAFAXINE HCL ER 75 MG PO CP24: 75.0000 mg | ORAL_CAPSULE | Freq: Every day | ORAL | 1 refills | Status: DC

## 2017-01-20 MED ORDER — VENLAFAXINE HCL ER 37.5 MG PO CP24: 37.5000 mg | ORAL_CAPSULE | Freq: Every day | ORAL | 0 refills | Status: DC

## 2017-01-20 NOTE — Progress Notes (Signed)
BP 116/78   Pulse 96   Temp 98.4 F (36.9 C) (Oral)   Ht 5\' 6"  (1.676 m)   Wt 132 lb (59.9 kg)   BMI 21.31 kg/m    Subjective:    Patient ID: Amy Pham, female    DOB: 02/19/1991, 26 y.o.   MRN: [id number]  HPI: Amy Pham is a 26 y.o. female presenting on 01/20/2017 for Anxiety (episodes of frustration, "flipping out")   HPI Anxiety and outbursts Patient is coming in with anxiety and outbursts been increasing over the past few months. She says she feels on edge and flips out easily at her mother and her father and other people around her. She does currently have us drain with living situation as she lives with her mother and father and has her 2 children with her. Her significant other is trying to quit narcotics as she has been clean for a few months but he is struggling with it. She denies any suicidal ideations or feelings of depression. She denies any issues with sleep but she just feels like she has easy anger outbursts. [facility]9 01/20/2017 01/07/2017 05/07/2016 03/25/2016 03/24/2016  Decreased Interest 0 0 0 2 0  Down, Depressed, Hopeless 0 0 0 2 0  PHQ - 2 Score 0 0 0 4 0  Altered sleeping - - - 2 -  Tired, decreased energy - - - 2 -  Change in appetite - - - 2 -  Feeling bad or failure about yourself  - - - 2 -  Trouble concentrating - - - 2 -  Moving slowly or fidgety/restless - - - 2 -  Suicidal thoughts - - - 0 -  PHQ-9 Score - - - 16 -  Difficult doing work/chores - - - - -     Relevant past medical, surgical, family and social history reviewed and updated as indicated. Interim medical history since our last visit reviewed. Allergies and medications reviewed and updated.  Review of Systems  Constitutional: Negative for chills and fever.  Respiratory: Negative for chest tightness and shortness of breath.   Cardiovascular: Negative for chest pain and leg swelling.  Skin: Negative for rash.  Neurological: Negative for light-headedness  and headaches.  Psychiatric/Behavioral: Positive for dysphoric mood. Negative for agitation, behavioral problems, self-injury, sleep disturbance and suicidal ideas. The patient is nervous/anxious.   All other systems reviewed and are negative.   Per HPI unless specifically indicated above        Objective:    BP 116/78   Pulse 96   Temp 98.4 F (36.9 C) (Oral)   Ht 5\' 6"  (1.676 m)   Wt 132 lb (59.9 kg)   BMI 21.31 kg/m   Wt Readings from Last 3 Encounters:  01/20/17 132 lb (59.9 kg)  01/07/17 131 lb (59.4 kg)  05/07/16 130 lb 9.6 oz (59.2 kg)    Physical Exam  Constitutional: She is oriented to person, place, and time. She appears well-developed and well-nourished. No distress.  Eyes: Conjunctivae are normal.  Cardiovascular: Normal rate, regular rhythm, normal heart sounds and intact distal pulses.   No murmur heard. Pulmonary/Chest: Effort normal and breath sounds normal. No respiratory distress. She has no wheezes. She has no rales.  Musculoskeletal: Normal range of motion.  Neurological: She is alert and oriented to person, place, and time. Coordination normal.  Skin: Skin is warm and dry. No rash noted. She is not diaphoretic.  Psychiatric: Her behavior is normal. Judgment normal.  Her mood appears anxious. She exhibits a depressed mood. She expresses no suicidal ideation. She expresses no suicidal plans.  Nursing note and vitals reviewed.       Assessment & Plan:   Problem List Items Addressed This Visit    None    Visit Diagnoses    Generalized anxiety disorder    -  Primary   Relevant Medications   venlafaxine XR (EFFEXOR XR) 37.5 MG 24 hr capsule   venlafaxine XR (EFFEXOR XR) 75 MG 24 hr capsule      Did not like Wellbutrin, only take it for a couple days, will try Effexor, instructed patient on side effects and to take it at least for 4 weeks until she comes back.  Follow up plan: Return in about 4 weeks (around 02/17/2017), or if symptoms worsen or  fail to improve, for Rechecking anxiety.  Counseling provided for all of the vaccine components No orders of the defined types were placed in this encounter.   Arville CareJoshua Shakai Dolley, MD [facility] 01/20/2017, 10:10 AM

## 2017-01-21 ENCOUNTER — Encounter: Payer: Self-pay | Admitting: Family Medicine

## 2017-01-21 ENCOUNTER — Ambulatory Visit (INDEPENDENT_AMBULATORY_CARE_PROVIDER_SITE_OTHER): Payer: Medicaid Other | Admitting: Family Medicine

## 2017-01-21 VITALS — BP 115/77 | HR 105 | Temp 99.6°F | Ht 66.0 in | Wt 132.0 lb

## 2017-01-21 DIAGNOSIS — R5383 Other fatigue: Secondary | ICD-10-CM | POA: Diagnosis not present

## 2017-01-21 DIAGNOSIS — R634 Abnormal weight loss: Secondary | ICD-10-CM

## 2017-01-21 DIAGNOSIS — R110 Nausea: Secondary | ICD-10-CM

## 2017-01-21 DIAGNOSIS — W57XXXD Bitten or stung by nonvenomous insect and other nonvenomous arthropods, subsequent encounter: Secondary | ICD-10-CM | POA: Diagnosis not present

## 2017-01-21 MED ORDER — SULFAMETHOXAZOLE-TRIMETHOPRIM 800-160 MG PO TABS: 1.0000 | ORAL_TABLET | Freq: Two times a day (BID) | ORAL | 0 refills | Status: DC

## 2017-01-21 MED ORDER — ALBUTEROL SULFATE HFA 108 (90 BASE) MCG/ACT IN AERS: 2.0000 | INHALATION_SPRAY | Freq: Four times a day (QID) | RESPIRATORY_TRACT | 1 refills | Status: DC | PRN

## 2017-01-21 MED ORDER — PREDNISONE 20 MG PO TABS: ORAL_TABLET | ORAL | 0 refills | Status: DC

## 2017-01-21 MED ORDER — BUDESONIDE-FORMOTEROL FUMARATE 80-4.5 MCG/ACT IN AERO: 2.0000 | INHALATION_SPRAY | Freq: Every day | RESPIRATORY_TRACT | 9 refills | Status: DC

## 2017-01-21 NOTE — Addendum Note (Signed)
Addended by: Arville CareETTINGER, Kimbree Casanas on: 01/21/2017 10:34 AM   Modules accepted: Orders, Level of Service

## 2017-01-21 NOTE — Progress Notes (Signed)
BP 115/77   Pulse (!) 105   Temp 99.6 F (37.6 C) (Oral)   Ht '5\' 6"'  (1.676 m)   Wt 132 lb (59.9 kg)   BMI 21.31 kg/m    Subjective:    Patient ID: Amy Pham, female    DOB: [birth year], 26 y.o.   MRN: [id number]  HPI: Amy Pham is a 26 y.o. female presenting on 01/21/2017 for Concerned she has worms (has been treated with Albenza and permethrin cream by [facility], but is concerned she still has worms)   HPI Rash Patient comes in complaining of a rash that's on her abdominal back area around her belt line.She does have pets and there is some concern that they may have fleas, patient thinks she might have worms and wants to be tested for that. She denies any diarrhea or blood in her stool. Most of the lesions around her abdomen bowel line. She denies any fevers or chills or drainage. The sites are very pruritic.  Fatigue and nausea and weight loss Patient has been having fatigue and some nausea and weight loss that's been going on for a few weeks to a few months gradually but has been increasing. She is most concerned about worms with her rash. She denies any abdominal pain or flank pain. She has been a lot more tired and falling asleep easier more recently.  Relevant past medical, surgical, family and social history reviewed and updated as indicated. Interim medical history since our last visit reviewed. Allergies and medications reviewed and updated.  Review of Systems  Constitutional: Positive for fatigue and unexpected weight change. Negative for chills and fever.  HENT: Negative for congestion, ear discharge, ear pain, sinus pain and sinus pressure.   Eyes: Negative for redness and visual disturbance.  Respiratory: Negative for cough, chest tightness and shortness of breath.   Cardiovascular: Negative for chest pain and leg swelling.  Gastrointestinal: Positive for nausea. Negative for abdominal pain, diarrhea and vomiting.  Genitourinary: Negative for difficulty  urinating and dysuria.  Musculoskeletal: Negative for back pain and gait problem.  Skin: Positive for rash.  Neurological: Negative for dizziness, light-headedness and headaches.  Psychiatric/Behavioral: Negative for agitation and behavioral problems.  All other systems reviewed and are negative.   Per HPI unless specifically indicated above     Objective:    BP 115/77   Pulse (!) 105   Temp 99.6 F (37.6 C) (Oral)   Ht '5\' 6"'  (1.676 m)   Wt 132 lb (59.9 kg)   BMI 21.31 kg/m   Wt Readings from Last 3 Encounters:  01/21/17 132 lb (59.9 kg)  01/20/17 132 lb (59.9 kg)  01/07/17 131 lb (59.4 kg)    Physical Exam  Constitutional: She is oriented to person, place, and time. She appears well-developed and well-nourished. No distress.  Eyes: Conjunctivae are normal.  Neck: Neck supple. No thyromegaly present.  Cardiovascular: Normal rate, regular rhythm, normal heart sounds and intact distal pulses.   No murmur heard. Pulmonary/Chest: Effort normal and breath sounds normal. No respiratory distress. She has no wheezes. She has no rales.  Abdominal: Soft. Bowel sounds are normal. She exhibits no distension. There is no tenderness. There is no rebound and no guarding.  Musculoskeletal: Normal range of motion. She exhibits no edema or tenderness.  Lymphadenopathy:    She has no cervical adenopathy.  Neurological: She is alert and oriented to person, place, and time. Coordination normal.  Skin: Skin is warm and dry. Rash noted. Rash  is papular (Papular rash with excoriations on abdomen and upper legs near the belt line. Lesions of both abdomen and back. Patient has a couple of lesions that are pink papules on her legs). She is not diaphoretic.  Psychiatric: She has a normal mood and affect. Her behavior is normal.  Nursing note and vitals reviewed.     Assessment & Plan:   Problem List Items Addressed This Visit    None    Visit Diagnoses    Arthropod bite, subsequent encounter     -  Primary   Likely bedbugs   Relevant Medications   sulfamethoxazole-trimethoprim (BACTRIM DS,SEPTRA DS) 800-160 MG tablet   predniSONE (DELTASONE) 20 MG tablet   Other fatigue       Relevant Orders   TSH (Completed)   CMP14+EGFR (Completed)   CBC with Differential/Platelet (Completed)   Nausea       Relevant Orders   TSH (Completed)   CMP14+EGFR (Completed)   CBC with Differential/Platelet (Completed)   Cdiff NAA+O+P+Stool Culture (Completed)   Weight loss       Relevant Orders   TSH (Completed)   CMP14+EGFR (Completed)   CBC with Differential/Platelet (Completed)   Cdiff NAA+O+P+Stool Culture (Completed)       Follow up plan: Return if symptoms worsen or fail to improve.  Counseling provided for all of the vaccine components Orders Placed This Encounter  Procedures  . Cdiff NAA+O+P+Stool Culture  . TSH  . CMP14+EGFR  . CBC with Differential/Platelet  . Hepatitis panel, acute  . Hgb A1c w/o eAG  . Specimen status report    Caryl Pina, MD [facility] 01/26/2017, 10:14 PM

## 2017-01-22 ENCOUNTER — Other Ambulatory Visit: Payer: Medicaid Other

## 2017-01-22 LAB — CMP14+EGFR
A/G RATIO: 1.8 (ref 1.2–2.2)
ALBUMIN: 4.5 g/dL (ref 3.5–5.5)
ALK PHOS: 53 IU/L (ref 39–117)
ALT: 81 IU/L — ABNORMAL HIGH (ref 0–32)
AST: 55 IU/L — ABNORMAL HIGH (ref 0–40)
BILIRUBIN TOTAL: 0.6 mg/dL (ref 0.0–1.2)
BUN / CREAT RATIO: 17 (ref 9–23)
BUN: 12 mg/dL (ref 6–20)
CHLORIDE: 101 mmol/L (ref 96–106)
CO2: 21 mmol/L (ref 20–29)
Calcium: 9.1 mg/dL (ref 8.7–10.2)
Creatinine, Ser: 0.71 mg/dL (ref 0.57–1.00)
GFR calc non Af Amer: 119 mL/min/{1.73_m2} (ref >59)
GFR, EST AFRICAN AMERICAN: 137 mL/min/{1.73_m2} (ref >59)
GLOBULIN, TOTAL: 2.5 g/dL (ref 1.5–4.5)
Glucose: 119 mg/dL — ABNORMAL HIGH (ref 65–99)
Potassium: 4 mmol/L (ref 3.5–5.2)
SODIUM: 139 mmol/L (ref 134–144)
TOTAL PROTEIN: 7 g/dL (ref 6.0–8.5)

## 2017-01-22 LAB — CBC WITH DIFFERENTIAL/PLATELET
BASOS ABS: 0 10*3/uL (ref 0.0–0.2)
Basos: 0 %
EOS (ABSOLUTE): 0.1 10*3/uL (ref 0.0–0.4)
Eos: 1 %
HEMATOCRIT: 39.8 % (ref 34.0–46.6)
HEMOGLOBIN: 13.3 g/dL (ref 11.1–15.9)
Immature Grans (Abs): 0 10*3/uL (ref 0.0–0.1)
Immature Granulocytes: 0 %
LYMPHS ABS: 2 10*3/uL (ref 0.7–3.1)
Lymphs: 30 %
MCH: 31.9 pg (ref 26.6–33.0)
MCHC: 33.4 g/dL (ref 31.5–35.7)
MCV: 95 fL (ref 79–97)
MONOS ABS: 0.5 10*3/uL (ref 0.1–0.9)
Monocytes: 8 %
NEUTROS ABS: 4 10*3/uL (ref 1.4–7.0)
Neutrophils: 61 %
Platelets: 205 10*3/uL (ref 150–379)
RBC: 4.17 x10E6/uL (ref 3.77–5.28)
RDW: 12.7 % (ref 12.3–15.4)
WBC: 6.6 10*3/uL (ref 3.4–10.8)

## 2017-01-22 LAB — TSH: TSH: 1.98 u[IU]/mL (ref 0.450–4.500)

## 2017-01-25 ENCOUNTER — Telehealth: Payer: Self-pay | Admitting: Nurse Practitioner

## 2017-01-25 ENCOUNTER — Encounter: Payer: Self-pay | Admitting: Family Medicine

## 2017-01-25 NOTE — Telephone Encounter (Signed)
not all labs are back at this time  - LM for pt to call so we can let her know

## 2017-01-26 ENCOUNTER — Telehealth: Payer: Self-pay | Admitting: Nurse Practitioner

## 2017-01-26 LAB — HEPATITIS PANEL, ACUTE
Hep A IgM: NEGATIVE
Hep B C IgM: NEGATIVE
Hepatitis B Surface Ag: NEGATIVE

## 2017-01-26 LAB — SPECIMEN STATUS REPORT

## 2017-01-26 LAB — HGB A1C W/O EAG: Hgb A1c MFr Bld: 5 % (ref 4.8–5.6)

## 2017-01-26 NOTE — Telephone Encounter (Signed)
ntbs

## 2017-01-26 NOTE — Telephone Encounter (Signed)
Pt is taking Linzess and has taken 2 in one day and is still having constipation. Please advise.

## 2017-01-28 ENCOUNTER — Telehealth: Payer: Self-pay | Admitting: Nurse Practitioner

## 2017-01-28 NOTE — Telephone Encounter (Signed)
Most of the stool study is back but we are still waiting on a couple of components of it that have not returned yet. We will let her know as soon as the whole study comes back.

## 2017-01-28 NOTE — Telephone Encounter (Signed)
Patient is calling requesting lab results. Part have been reviewed and part hasn't. Would you please review and route to pool A

## 2017-01-28 NOTE — Telephone Encounter (Signed)
Pt aware.

## 2017-01-29 ENCOUNTER — Telehealth: Payer: Self-pay | Admitting: Family Medicine

## 2017-01-29 DIAGNOSIS — R768 Other specified abnormal immunological findings in serum: Secondary | ICD-10-CM

## 2017-01-29 LAB — CDIFF NAA+O+P+STOOL CULTURE
E coli, Shiga toxin Assay: NEGATIVE
Toxigenic C. Difficile by PCR: NEGATIVE

## 2017-01-29 NOTE — Telephone Encounter (Signed)
Patient came back with hepatitis C antibody positive. I put in follow-up testing in both her liver and hepatitis C quant. Please have her come in and do this testing. Arville CareJoshua Mong Neal, MD [facility] 01/29/2017, 7:48 AM

## 2017-01-29 NOTE — Telephone Encounter (Signed)
Pt's mom notified pt needs to come back in for more labwork Did not discuss results Mom will have pt come in for additional labs

## 2017-02-19 ENCOUNTER — Encounter: Payer: Self-pay | Admitting: Family Medicine

## 2017-02-19 ENCOUNTER — Ambulatory Visit (INDEPENDENT_AMBULATORY_CARE_PROVIDER_SITE_OTHER): Payer: Medicaid Other | Admitting: Family Medicine

## 2017-02-19 VITALS — BP 121/75 | HR 101 | Temp 98.8°F | Ht 66.0 in | Wt 133.0 lb

## 2017-02-19 DIAGNOSIS — R768 Other specified abnormal immunological findings in serum: Secondary | ICD-10-CM

## 2017-02-19 DIAGNOSIS — F411 Generalized anxiety disorder: Secondary | ICD-10-CM | POA: Diagnosis not present

## 2017-02-19 DIAGNOSIS — F320 Major depressive disorder, single episode, mild: Secondary | ICD-10-CM

## 2017-02-19 DIAGNOSIS — Z202 Contact with and (suspected) exposure to infections with a predominantly sexual mode of transmission: Secondary | ICD-10-CM

## 2017-02-19 DIAGNOSIS — W57XXXD Bitten or stung by nonvenomous insect and other nonvenomous arthropods, subsequent encounter: Secondary | ICD-10-CM | POA: Diagnosis not present

## 2017-02-19 MED ORDER — PERMETHRIN 1 % EX LOTN: 1.0000 "application " | TOPICAL_LOTION | Freq: Once | CUTANEOUS | 0 refills | Status: AC

## 2017-02-19 MED ORDER — VENLAFAXINE HCL ER 150 MG PO CP24: 150.0000 mg | ORAL_CAPSULE | Freq: Every day | ORAL | 1 refills | Status: DC

## 2017-02-19 MED ORDER — PERMETHRIN 1 % EX LOTN: 1.0000 "application " | TOPICAL_LOTION | Freq: Once | CUTANEOUS | 0 refills | Status: DC

## 2017-02-19 NOTE — Addendum Note (Signed)
Addended by: Quay BurowPOTTER, Twan Harkin K on: 02/19/2017 05:07 PM   Modules accepted: Orders
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
## 2017-02-20 LAB — STD SCREEN (8)
HEP A IGM: NEGATIVE
HEP B C IGM: NEGATIVE
HIV Screen 4th Generation wRfx: NONREACTIVE
HSV 1 Glycoprotein G Ab, IgG: <0.91 index (ref 0.00–0.90)
HSV 2 IGG, TYPE SPEC: 11.3 {index} — AB (ref 0.00–0.90)
Hepatitis B Surface Ag: NEGATIVE
RPR: NONREACTIVE

## 2017-02-20 LAB — HCV RNA (INTERNATIONAL UNITS)
HCV RNA (International Units): 11800000 IU/mL
HCV log10: 7.072 log10 IU/mL

## 2017-02-20 LAB — HCV RNA QUANT

## 2017-02-26 ENCOUNTER — Telehealth: Payer: Self-pay | Admitting: Nurse Practitioner

## 2017-03-23 ENCOUNTER — Ambulatory Visit: Payer: Medicaid Other | Admitting: Family

## 2017-03-24 ENCOUNTER — Encounter: Payer: Self-pay | Admitting: Nurse Practitioner

## 2017-05-04 ENCOUNTER — Ambulatory Visit: Payer: Medicaid Other | Admitting: Family Medicine

## 2017-05-10 ENCOUNTER — Other Ambulatory Visit: Payer: Self-pay | Admitting: Family Medicine

## 2017-05-10 DIAGNOSIS — F320 Major depressive disorder, single episode, mild: Secondary | ICD-10-CM

## 2017-05-10 DIAGNOSIS — F411 Generalized anxiety disorder: Secondary | ICD-10-CM

## 2017-05-14 ENCOUNTER — Ambulatory Visit (INDEPENDENT_AMBULATORY_CARE_PROVIDER_SITE_OTHER): Payer: Medicaid Other | Admitting: Pediatrics

## 2017-05-14 ENCOUNTER — Encounter: Payer: Self-pay | Admitting: Pediatrics

## 2017-05-14 VITALS — BP 112/72 | HR 104 | Temp 97.0°F | Ht 66.0 in | Wt 139.4 lb

## 2017-05-14 DIAGNOSIS — B86 Scabies: Secondary | ICD-10-CM

## 2017-05-14 DIAGNOSIS — B80 Enterobiasis: Secondary | ICD-10-CM | POA: Diagnosis not present

## 2017-05-14 DIAGNOSIS — R21 Rash and other nonspecific skin eruption: Secondary | ICD-10-CM

## 2017-05-14 DIAGNOSIS — M273 Alveolitis of jaws: Secondary | ICD-10-CM

## 2017-05-14 MED ORDER — PERMETHRIN 5 % EX CREA: TOPICAL_CREAM | CUTANEOUS | 0 refills | Status: DC

## 2017-05-14 MED ORDER — CLINDAMYCIN HCL 300 MG PO CAPS: 600.0000 mg | ORAL_CAPSULE | Freq: Three times a day (TID) | ORAL | 0 refills | Status: DC

## 2017-05-14 MED ORDER — MEBENDAZOLE 100 MG PO CHEW: CHEWABLE_TABLET | ORAL | 0 refills | Status: DC

## 2017-05-14 NOTE — Progress Notes (Signed)
  Subjective:   Patient ID: Amy Pham, female    DOB: 08/03/1990, 26 y.o.   MRN: [id number] CC: Rash  HPI: Amy Pham is a 26 y.o. female presenting for Rash  Rash on arms, neck, sometimes trunk been there for 8 months Comes and goes She will have a patch from half a centimeter to 1.5 cm Well-demarcated irregular borders Does not itch does not hurt Sometimes will develop scabs over it Says she does not think she picks or scratches at her skin, keeps her nails short to make sure  Kids have recently been treated for scabies and pinworms, she worries about having gotten exposed  [facility] Last used IV heroine over a year ago Does still use meth denies cocaine use  Has hepatitis C, not yet treated yet. [facility] due to getting suboxone through them, she needs referral through them for her medicaid.   Has had pain in back lower L side of her mouth Has had bad smelling discharge form the area Says she saw dentist within the past month, this was not there when she saw dentist No fevers  Relevant past medical, surgical, family and social history reviewed. Allergies and medications reviewed and updated. Social History   Tobacco Use  Smoking Status Current Every Day Smoker  . Packs/day: 1.00  . Types: Cigarettes  Smokeless Tobacco Never Used   ROS: Per HPI   Objective:    BP 112/72   Pulse (!) 104   Temp (!) 97 F (36.1 C) (Oral)   Ht 5\' 6"  (1.676 m)   Wt 139 lb 6.4 oz (63.2 kg)   BMI 22.50 kg/m   Wt Readings from Last 3 Encounters:  05/14/17 139 lb 6.4 oz (63.2 kg)  02/19/17 133 lb (60.3 kg)  01/21/17 132 lb (59.9 kg)    Gen: NAD, alert, cooperative with exam, NCAT EYES: EOMI, no conjunctival injection, or no icterus ENT: OP without erythema, L lower posterior most molar with ttp, white discharge present LYMPH: no cervical LAD CV: NRRR, normal S1/S2, no murmur, distal pulses 2+ b/l Resp: CTABL, no  wheezes, normal WOB Abd: +BS, soft, NTND. no guarding or organomegaly Ext: No edema, warm Neuro: Alert and oriented MSK: normal muscle bulk Skin: slightly red 0.5cm to 1 cm patches over both arms, one on L forearm slight erosion, similar on back of neck with yellow crust.   Assessment & Plan:  Amy Pham was seen today for rash.  Diagnoses and all orders for this visit:  Infection of tooth socket Call to set up appt with dentist, start below -     clindamycin (CLEOCIN) 300 MG capsule; Take 2 capsules (600 mg total) by mouth 3 (three) times daily.  Pinworm infection Recent exposure, if develop symptoms, start below -     mebendazole (VERMOX) 100 MG chewable tablet; Take one tab, repeat in 2 weeks  Scabies Recent exposure, if develops symptoms start below -     permethrin (ELIMITE) 5 % cream; massage from head to soles of feet; including hairline, neck, scalp, temple, and forehead, avoid eyes, leave on for 8 hours before shower  Rash Not itchy, says she isnt picking at skin Offered biopsy, declined, present for several months, no worsening but no improvement Rec seeing dermatology Pt will [facility]  Follow up plan: As needed Rex Krasarol Emmaclaire Switala, MD [facility]

## 2017-05-14 NOTE — Patient Instructions (Addendum)
[facility] [phone]  [address]  [address]  [place], [place] [address]   [facility] [place] Address: [address], [place], [place] [address] Phone: [phone]  Scabies treatment: massage from head to soles of feet; including hairline, neck, scalp, temple, and forehead, avoid eyes, leave on for 8-14 hours before shower, do overnight. Wash all sheets/clothes in hot water with hot dryer cycle. Itching may continue for 1 week after treatment, can use benadryl as needed for itching. If still itching 2 weeks after treatment may need repeat dose.

## 2017-07-02 ENCOUNTER — Other Ambulatory Visit: Payer: Self-pay | Admitting: Nurse Practitioner

## 2017-07-02 DIAGNOSIS — F320 Major depressive disorder, single episode, mild: Secondary | ICD-10-CM

## 2017-07-02 DIAGNOSIS — F411 Generalized anxiety disorder: Secondary | ICD-10-CM

## 2020-04-09 ENCOUNTER — Other Ambulatory Visit: Payer: Self-pay

## 2020-04-09 ENCOUNTER — Encounter: Payer: Self-pay | Admitting: Nurse Practitioner

## 2020-04-09 ENCOUNTER — Ambulatory Visit (INDEPENDENT_AMBULATORY_CARE_PROVIDER_SITE_OTHER): Payer: Commercial Managed Care - PPO | Admitting: Nurse Practitioner

## 2020-04-09 ENCOUNTER — Other Ambulatory Visit (HOSPITAL_COMMUNITY)
Admission: RE | Admit: 2020-04-09 | Discharge: 2020-04-09 | Disposition: A | Discharge status: Home / Self Care | Payer: Commercial Managed Care - PPO | Source: Ambulatory Visit | Attending: Nurse Practitioner | Admitting: Nurse Practitioner

## 2020-04-09 VITALS — BP 103/69 | HR 101 | Temp 96.9°F | Resp 20 | Ht 66.0 in | Wt 133.0 lb

## 2020-04-09 DIAGNOSIS — Z0000 Encounter for general adult medical examination without abnormal findings: Secondary | ICD-10-CM

## 2020-04-09 DIAGNOSIS — R768 Other specified abnormal immunological findings in serum: Secondary | ICD-10-CM

## 2020-04-09 DIAGNOSIS — F320 Major depressive disorder, single episode, mild: Secondary | ICD-10-CM

## 2020-04-09 DIAGNOSIS — F411 Generalized anxiety disorder: Secondary | ICD-10-CM

## 2020-04-09 DIAGNOSIS — Z720 Tobacco use: Secondary | ICD-10-CM

## 2020-04-09 DIAGNOSIS — F1120 Opioid dependence, uncomplicated: Secondary | ICD-10-CM

## 2020-04-09 NOTE — Progress Notes (Signed)
Subjective:    Patient ID: Amy Pham, female    DOB: April 20, 1991, 29 y.o.   MRN: [id number]   Chief Complaint: Annual Exam    HPI:  1. Annual physical exam Is having PAP today  2. Depression, major, single episode, mild (HCC) Is currently on no medication. Says she is doing well. Depression screen [facility] 2/9 04/09/2020 05/14/2017 02/19/2017  Decreased Interest 0 0 0  Down, Depressed, Hopeless 0 0 0  PHQ - 2 Score 0 0 0  Altered sleeping - - -  Tired, decreased energy - - -  Change in appetite - - -  Feeling bad or failure about yourself  - - -  Trouble concentrating - - -  Moving slowly or fidgety/restless - - -  Suicidal thoughts - - -  PHQ-9 Score - - -  Difficult doing work/chores - - -     3. Generalized anxiety disorder Has lots of anxiety issues. She works full time and is raising 2 kids on her own. Due to drug history she doe snot want to take anything.  4. Opioid dependence on agonist therapy (HCC) Use to be on methadone, but has manageger dto get herself offo f it. Has been clean for over a year.  5. Tobacco abuse Smokes over a pack a day     Outpatient Encounter Medications as of 04/09/2020  Medication Sig  . [DISCONTINUED] albuterol (PROVENTIL HFA;VENTOLIN HFA) 108 (90 Base) MCG/ACT inhaler Inhale 2 puffs into the lungs every 6 (six) hours as needed for wheezing or shortness of breath.  . [DISCONTINUED] budesonide-formoterol (SYMBICORT) 80-4.5 MCG/ACT inhaler Inhale 2 puffs into the lungs daily.  . [DISCONTINUED] clindamycin (CLEOCIN) 300 MG capsule Take 2 capsules (600 mg total) by mouth 3 (three) times daily.  . [DISCONTINUED] mebendazole (VERMOX) 100 MG chewable tablet Take one tab, repeat in 2 weeks  . [DISCONTINUED] permethrin (ELIMITE) 5 % cream massage from head to soles of feet; including hairline, neck, scalp, temple, and forehead, avoid eyes, leave on for 8 hours before shower  . [DISCONTINUED] venlafaxine XR (EFFEXOR-XR) 150 MG 24 hr  capsule TAKE 1 CAPSULE (150 MG TOTAL) BY MOUTH DAILY WITH BREAKFAST.   No facility-administered encounter medications on file as of 04/09/2020.    Past Surgical History:  Procedure Laterality Date  . CESAREAN SECTION  05/31/2011   Procedure: CESAREAN SECTION;  Surgeon: Michael Litter, MD;  Location: [facility];  Service: Gynecology;  Laterality: N/A;  Primary cesarean section with delivery of baby boy at 6. apgars 8/9.  Marland Kitchen CESAREAN SECTION N/A 07/19/2013   Procedure: CESAREAN SECTION;  Surgeon: Adam Phenix, MD;  Location: [facility];  Service: Obstetrics;  Laterality: N/A;  . LAPAROSCOPIC TUBAL LIGATION Bilateral 02/21/2014   Procedure: LAPAROSCOPIC TUBAL LIGATION;  Surgeon: Adam Phenix, MD;  Location: [facility];  Service: Gynecology;  Laterality: Bilateral;  . TONSILLECTOMY     2007    Family History  Problem Relation Age of Onset  . Depression Mother   . Mental illness Father   . Diabetes Maternal Uncle   . Arthritis Maternal Grandmother   . Cancer Maternal Grandmother   . Heart disease Paternal Grandfather     New complaints: She was told by methadone clinic that she had hepatitis C. She has never followed up on that.  Social history: Lives with her parents right now but is getting ready to move to her own place. Her kids dad in in prison right now.  Controlled substance contract:  n/a    Review of Systems  Constitutional: Negative for diaphoresis.  Eyes: Negative for pain.  Respiratory: Negative for shortness of breath.   Cardiovascular: Negative for chest pain, palpitations and leg swelling.  Gastrointestinal: Negative for abdominal pain.  Endocrine: Negative for polydipsia.  Skin: Negative for rash.       Hair is breaking off at scalp left back  Neurological: Negative for dizziness, weakness and headaches.  Hematological: Does not bruise/bleed easily.  All other systems reviewed and are negative.      Objective:   Physical Exam Vitals and nursing note reviewed.    Constitutional:      General: She is not in acute distress.    Appearance: Normal appearance. She is well-developed.  HENT:     Head: Normocephalic.     Nose: Nose normal.  Eyes:     Pupils: Pupils are equal, round, and reactive to light.  Neck:     Vascular: No carotid bruit or JVD.  Cardiovascular:     Rate and Rhythm: Normal rate and regular rhythm.     Heart sounds: Normal heart sounds.  Pulmonary:     Effort: Pulmonary effort is normal. No respiratory distress.     Breath sounds: Normal breath sounds. No wheezing or rales.  Chest:     Chest wall: No tenderness.  Abdominal:     General: Bowel sounds are normal. There is no distension or abdominal bruit.     Palpations: Abdomen is soft. There is no hepatomegaly, splenomegaly, mass or pulsatile mass.     Tenderness: There is no abdominal tenderness.  Genitourinary:    General: Normal vulva.     Vagina: No vaginal discharge.     Rectum: Normal.  Musculoskeletal:        General: Normal range of motion.     Cervical back: Normal range of motion and neck supple.  Lymphadenopathy:     Cervical: No cervical adenopathy.  Skin:    General: Skin is warm and dry.  Neurological:     Mental Status: She is alert and oriented to person, place, and time.     Deep Tendon Reflexes: Reflexes are normal and symmetric.  Psychiatric:        Behavior: Behavior normal.        Thought Content: Thought content normal.        Judgment: Judgment normal.    BP 103/69   Pulse (!) 101   Temp (!) 96.9 F (36.1 C) (Temporal)   Resp 20   Ht 5\' 6"  (1.676 m)   Wt 133 lb (60.3 kg)   SpO2 98%   BMI 21.47 kg/m         Assessment & Plan:  Amy Pham comes in today with chief complaint of Annual Exam   Diagnosis and orders addressed:  1. Annual physical exam  2. Depression, major, single episode, mild (HCC) Stress management  3. Generalized anxiety disorder Do not want to add meds if we don't need to given her history  4.  Opioid dependence on agonist therapy (HCC)  5. Tobacco abuse Smoking cessation encouraged   Labs pending Health Maintenance reviewed Diet and exercise encouraged  Follow up plan: prn   Amy Amy Kindred, FNP

## 2020-04-09 NOTE — Patient Instructions (Signed)

## 2020-04-09 NOTE — Addendum Note (Signed)
Addended by: Bennie Pierini on: 04/09/2020 04:14 PM   Modules accepted: Orders

## 2020-04-10 LAB — STD SCREEN (8)
HIV Screen 4th Generation wRfx: NONREACTIVE
HSV 1 Glycoprotein G Ab, IgG: <0.91 index (ref 0.00–0.90)
HSV 2 IgG, Type Spec: 8.71 index — ABNORMAL HIGH (ref 0.00–0.90)
Hep A IgM: NEGATIVE
Hep B C IgM: NEGATIVE
Hep C Virus Ab: >11 s/co ratio — ABNORMAL HIGH (ref 0.0–0.9)
Hepatitis B Surface Ag: NEGATIVE
RPR Ser Ql: NONREACTIVE

## 2020-04-10 LAB — THYROID PANEL WITH TSH
Free Thyroxine Index: 2.3 (ref 1.2–4.9)
T3 Uptake Ratio: 24 % (ref 24–39)
T4, Total: 9.6 ug/dL (ref 4.5–12.0)
TSH: 1.48 u[IU]/mL (ref 0.450–4.500)

## 2020-04-10 LAB — CMP14+EGFR
ALT: 47 IU/L — ABNORMAL HIGH (ref 0–32)
AST: 37 IU/L (ref 0–40)
Albumin/Globulin Ratio: 1.7 (ref 1.2–2.2)
Albumin: 4.5 g/dL (ref 3.9–5.0)
Alkaline Phosphatase: 73 IU/L (ref 44–121)
BUN/Creatinine Ratio: 15 (ref 9–23)
BUN: 12 mg/dL (ref 6–20)
Bilirubin Total: 0.4 mg/dL (ref 0.0–1.2)
CO2: 25 mmol/L (ref 20–29)
Calcium: 9 mg/dL (ref 8.7–10.2)
Chloride: 103 mmol/L (ref 96–106)
Creatinine, Ser: 0.82 mg/dL (ref 0.57–1.00)
GFR calc Af Amer: 112 mL/min/{1.73_m2} (ref >59)
GFR calc non Af Amer: 97 mL/min/{1.73_m2} (ref >59)
Globulin, Total: 2.6 g/dL (ref 1.5–4.5)
Glucose: 65 mg/dL (ref 65–99)
Potassium: 4.1 mmol/L (ref 3.5–5.2)
Sodium: 141 mmol/L (ref 134–144)
Total Protein: 7.1 g/dL (ref 6.0–8.5)

## 2020-04-10 LAB — CBC WITH DIFFERENTIAL/PLATELET
Basophils Absolute: 0.1 10*3/uL (ref 0.0–0.2)
Basos: 1 %
EOS (ABSOLUTE): 0.1 10*3/uL (ref 0.0–0.4)
Eos: 1 %
Hematocrit: 40.6 % (ref 34.0–46.6)
Hemoglobin: 14.3 g/dL (ref 11.1–15.9)
Immature Grans (Abs): 0 10*3/uL (ref 0.0–0.1)
Immature Granulocytes: 0 %
Lymphocytes Absolute: 2.9 10*3/uL (ref 0.7–3.1)
Lymphs: 39 %
MCH: 33.8 pg — ABNORMAL HIGH (ref 26.6–33.0)
MCHC: 35.2 g/dL (ref 31.5–35.7)
MCV: 96 fL (ref 79–97)
Monocytes Absolute: 0.6 10*3/uL (ref 0.1–0.9)
Monocytes: 8 %
Neutrophils Absolute: 3.8 10*3/uL (ref 1.4–7.0)
Neutrophils: 51 %
Platelets: 221 10*3/uL (ref 150–450)
RBC: 4.23 x10E6/uL (ref 3.77–5.28)
RDW: 11.6 % — ABNORMAL LOW (ref 11.7–15.4)
WBC: 7.4 10*3/uL (ref 3.4–10.8)

## 2020-04-10 LAB — LIPID PANEL
Chol/HDL Ratio: 2.3 ratio (ref 0.0–4.4)
Cholesterol, Total: 149 mg/dL (ref 100–199)
HDL: 65 mg/dL (ref >39)
LDL Chol Calc (NIH): 74 mg/dL (ref 0–99)
Triglycerides: 47 mg/dL (ref 0–149)
VLDL Cholesterol Cal: 10 mg/dL (ref 5–40)

## 2020-04-12 ENCOUNTER — Telehealth: Payer: Self-pay

## 2020-04-12 LAB — CYTOLOGY - PAP
Chlamydia: NEGATIVE
Comment: NEGATIVE
Comment: NEGATIVE
Comment: NORMAL
Diagnosis: UNDETERMINED — AB
Neisseria Gonorrhea: NEGATIVE
Trichomonas: NEGATIVE

## 2020-04-12 NOTE — Addendum Note (Signed)
Addended by: Bennie Pierini on: 04/12/2020 09:04 AM   Modules accepted: Orders

## 2020-04-15 NOTE — Telephone Encounter (Signed)
No answer, mailbox full

## 2020-04-15 NOTE — Telephone Encounter (Signed)
Are you going to send in Valtrex for flare ups for this patient?

## 2020-04-15 NOTE — Telephone Encounter (Signed)
When wa slast flare up and has she been taking valtreex daily- not on chart?

## 2020-04-16 NOTE — Telephone Encounter (Signed)
Patient states she does not take valtrex daily and has not had a recent flare.  Patient states she will call back if and when she has another flare.  Patient also aware of lab results and referral placed to GI>

## 2020-04-18 ENCOUNTER — Encounter: Payer: Self-pay | Admitting: Internal Medicine
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
## 2020-06-05 ENCOUNTER — Ambulatory Visit: Payer: Commercial Managed Care - PPO | Admitting: Gastroenterology

## 2020-06-05 ENCOUNTER — Encounter: Payer: Self-pay | Admitting: Gastroenterology

## 2020-06-05 ENCOUNTER — Other Ambulatory Visit: Payer: Self-pay

## 2020-06-05 ENCOUNTER — Telehealth: Payer: Self-pay

## 2020-06-05 VITALS — BP 105/72 | HR 75 | Temp 97.3°F | Ht 67.0 in | Wt 144.0 lb

## 2020-06-05 DIAGNOSIS — R768 Other specified abnormal immunological findings in serum: Secondary | ICD-10-CM

## 2020-06-05 DIAGNOSIS — R7989 Other specified abnormal findings of blood chemistry: Secondary | ICD-10-CM | POA: Insufficient documentation

## 2020-06-05 NOTE — Telephone Encounter (Signed)
[place] abd w/elastography scheduled for 06/07/20 at 10:30am, arrive at 10:15am. NPO after midnight prior to test.  Pt had requested to call her mobile# w/appt. Tried to call pt, no answer, unable to leave VM d/t mailbox full. MyChart message/letter sent to pt.

## 2020-06-05 NOTE — Assessment & Plan Note (Addendum)
29 year old female with positive hepatitis C antibody.  Patient reports she was told she had hep C a couple of years ago and methadone clinic. Hep C likely contracted between 2-8 years ago in the setting of IV and intranasal drug use. She has been abstinent for the last 2 years. No history of significant alcohol use. LFTs elevated 3 years ago with AST 55, ALT 81. Recent labs in October 2021 with AST normal, ALT 47. No abdominal imaging on file. No regular tylenol use, OTC supplements, or herbal teas.  No signs or symptoms of decompensated liver disease.   Query whether she may be be clearing Hep C on her own as her LFTs are improving.  Plan:  CBC, CMP, INR, Hep A Ab total, Hep B core Ab, Hep B surface AB, Hep B surface Ag, Hep C RNA, HIV US abdomen complete with elastography Further recommendations to follow.  Hep C Precautions:   Avoid sharing personal equipment including toothbrushes, dental, or shaving equipment with others.  Cover any bleeding wound to prevent the possibility of others coming into contact with your blood.  No donating blood.  Clean blood spills with 1 part bleach to 9 parts water wearing gloves.

## 2020-06-05 NOTE — Patient Instructions (Signed)
Please have labs completed at [facility].  Please have ultrasound plan at [facility].  We will call you with results and further recommendations.  Hep C Precautions:  Avoid sharing personal equipment including toothbrushes, dental, or shaving equipment with others. Cover any bleeding wound to prevent the possibility of others coming into contact with your blood. No donating blood. Clean blood spills with 1 part bleach to 9 parts water wearing gloves.  Ermalinda Memos, PA-C [facility]

## 2020-06-05 NOTE — Assessment & Plan Note (Signed)
Addressed under "Hepatitis C antibody positive in blood".

## 2020-06-05 NOTE — Progress Notes (Signed)
Cc'ed to pcp °

## 2020-06-07 ENCOUNTER — Ambulatory Visit (HOSPITAL_COMMUNITY): Payer: Commercial Managed Care - PPO

## 2020-06-17 ENCOUNTER — Ambulatory Visit (HOSPITAL_COMMUNITY)
Admission: RE | Admit: 2020-06-17 | Discharge: 2020-06-17 | Disposition: A | Discharge status: Home / Self Care | Payer: Commercial Managed Care - PPO | Source: Ambulatory Visit | Attending: Gastroenterology | Admitting: Gastroenterology

## 2020-06-17 ENCOUNTER — Other Ambulatory Visit: Payer: Self-pay

## 2020-06-17 DIAGNOSIS — R7989 Other specified abnormal findings of blood chemistry: Secondary | ICD-10-CM | POA: Insufficient documentation

## 2020-06-17 DIAGNOSIS — R768 Other specified abnormal immunological findings in serum: Secondary | ICD-10-CM | POA: Insufficient documentation

## 2020-06-24 ENCOUNTER — Telehealth: Payer: Self-pay | Admitting: Internal Medicine

## 2020-06-24 NOTE — Telephone Encounter (Signed)
Patient called asking about ultrasound results

## 2020-06-25 NOTE — Telephone Encounter (Signed)
Spoke with pt. Pt was given results by Ermalinda Memos, PA previously. Pt was advised to complete labs for further recommendations. Pt will complete labs at the end of the week.

## 2020-07-23 ENCOUNTER — Other Ambulatory Visit: Payer: Self-pay

## 2020-07-23 DIAGNOSIS — K838 Other specified diseases of biliary tract: Secondary | ICD-10-CM

## 2020-07-23 DIAGNOSIS — R1011 Right upper quadrant pain: Secondary | ICD-10-CM

## 2020-07-24 ENCOUNTER — Ambulatory Visit (HOSPITAL_COMMUNITY): Admission: RE | Admit: 2020-07-24 | Payer: Commercial Managed Care - PPO | Source: Ambulatory Visit

## 2020-07-24 ENCOUNTER — Telehealth: Payer: Self-pay

## 2020-07-24 NOTE — Telephone Encounter (Signed)
Noted  

## 2020-07-24 NOTE — Telephone Encounter (Signed)
FYI Spoke with pt. Pt didn't receive mychart message sent yestedday and called back this morning at 10:20 AM. Pt was given Central Scheduling number [phone] to r/s her ASAP MRI scheduled for today 07/24/20 @ 9:30.

## 2020-07-24 NOTE — Telephone Encounter (Signed)
Noted. No further recommendations at this time. 

## 2020-07-25 ENCOUNTER — Other Ambulatory Visit: Payer: Self-pay | Admitting: Gastroenterology

## 2020-07-25 ENCOUNTER — Ambulatory Visit (HOSPITAL_COMMUNITY)
Admission: RE | Admit: 2020-07-25 | Discharge: 2020-07-25 | Disposition: A | Discharge status: Home / Self Care | Payer: Commercial Managed Care - PPO | Source: Ambulatory Visit | Attending: Gastroenterology | Admitting: Gastroenterology

## 2020-07-25 ENCOUNTER — Other Ambulatory Visit: Payer: Self-pay

## 2020-07-25 DIAGNOSIS — R1011 Right upper quadrant pain: Secondary | ICD-10-CM | POA: Insufficient documentation

## 2020-07-25 DIAGNOSIS — K838 Other specified diseases of biliary tract: Secondary | ICD-10-CM | POA: Insufficient documentation

## 2020-07-25 MED ORDER — GADOBUTROL 1 MMOL/ML IV SOLN
6.0000 mL | Freq: Once | INTRAVENOUS | Status: AC | PRN
  Administered 2020-07-25: 7 mL via INTRAVENOUS

## 2021-01-08 ENCOUNTER — Ambulatory Visit: Payer: Commercial Managed Care - PPO | Admitting: Family Medicine

## 2021-01-09 ENCOUNTER — Encounter: Payer: Self-pay | Admitting: Nurse Practitioner

## 2021-01-10 ENCOUNTER — Other Ambulatory Visit: Payer: Self-pay

## 2021-01-10 ENCOUNTER — Encounter: Payer: Self-pay | Admitting: Nurse Practitioner

## 2021-01-10 ENCOUNTER — Ambulatory Visit: Payer: Medicaid Other | Admitting: Nurse Practitioner

## 2021-01-10 VITALS — BP 109/78 | HR 79 | Temp 98.1°F | Resp 20 | Ht 67.0 in | Wt 142.0 lb

## 2021-01-10 DIAGNOSIS — B80 Enterobiasis: Secondary | ICD-10-CM | POA: Diagnosis not present

## 2021-01-10 DIAGNOSIS — F321 Major depressive disorder, single episode, moderate: Secondary | ICD-10-CM

## 2021-01-10 DIAGNOSIS — R5383 Other fatigue: Secondary | ICD-10-CM

## 2021-01-10 MED ORDER — ALBENDAZOLE 200 MG PO TABS: ORAL_TABLET | ORAL | 0 refills | Status: DC

## 2021-01-10 NOTE — Patient Instructions (Signed)
http://APA.org/depression-guideline"> https://clinicalkey.com"> http://point-of-care.elsevierperformancemanager.com/skills/"> http://point-of-care.elsevierperformancemanager.com">  Managing Depression, Adult Depression is a mental health condition that affects your thoughts, feelings, and actions. Being diagnosed with depression can bring you relief if you did not know why you have felt or behaved a certain way. It could also leave you feeling overwhelmed with uncertainty about your future. Preparing yourself tomanage your symptoms can help you feel more positive about your future. How to manage lifestyle changes Managing stress  Stress is your body's reaction to life changes and events, both good and bad. Stress can add to your feelings of depression. Learning to manage your stresscan help lessen your feelings of depression. Try some of the following approaches to reducing your stress (stress reduction techniques): Listen to music that you enjoy and that inspires you. Try using a meditation app or take a meditation class. Develop a practice that helps you connect with your spiritual self. Walk in nature, pray, or go to a place of worship. Do some deep breathing. To do this, inhale slowly through your nose. Pause at the top of your inhale for a few seconds and then exhale slowly, letting your muscles relax. Practice yoga to help relax and work your muscles. Choose a stress reduction technique that suits your lifestyle and personality. These techniques take time and practice to develop. Set aside 5-15 minutes a day to do them. Therapists can offer training in these techniques. Other things you can do to manage stress include: Keeping a stress diary. Knowing your limits and saying no when you think something is too much. Paying attention to how you react to certain situations. You may not be able to control everything, but you can change your reaction. Adding humor to your life by watching funny films  or TV shows. Making time for activities that you enjoy and that relax you.  Medicines Medicines, such as antidepressants, are often a part of treatment for depression. Talk with your pharmacist or health care provider about all the medicines, supplements, and herbal products that you take, their possible side effects, and what medicines and other products are safe to take together. Make sure to report any side effects you may have to your health care provider. Relationships Your health care provider may suggest family therapy, couples therapy, orindividual therapy as part of your treatment. How to recognize changes Everyone responds differently to treatment for depression. As you recover from depression, you may start to: Have more interest in doing activities. Feel less hopeless. Have more energy. Overeat less often, or have a better appetite. Have better mental focus. It is important to recognize if your depression is not getting better or is getting worse. The symptoms you had in the beginning may return, such as: Tiredness (fatigue) or low energy. Eating too much or too little. Sleeping too much or too little. Feeling restless, agitated, or hopeless. Trouble focusing or making decisions. Unexplained physical complaints. Feeling irritable, angry, or aggressive. If you or your family members notice these symptoms coming back, let yourhealth care provider know right away. Follow these instructions at home: Activity  Try to get some form of exercise each day, such as walking, biking, swimming, or lifting weights. Practice stress reduction techniques. Engage your mind by taking a class or doing some volunteer work.  Lifestyle Get the right amount and quality of sleep. Cut down on using caffeine, tobacco, alcohol, and other potentially harmful substances. Eat a healthy diet that includes plenty of vegetables, fruits, whole grains, low-fat dairy products, and lean protein. Do not   eat  a lot of foods that are high in solid fats, added sugars, or salt (sodium). General instructions Take over-the-counter and prescription medicines only as told by your health care provider. Keep all follow-up visits as told by your health care provider. This is important. Where to find support Talking to others  Friends and family members can be sources of support and guidance. Talk to trusted friends or family members about your condition. Explain your symptoms to them, and let them know that you are working with a health care provider to treat your depression. Tell friends and family members how they also can behelpful. Finances Find appropriate mental health providers that fit with your financial situation. Talk with your health care provider about options to get reduced prices on your medicines. Where to find more information You can find support in your area from: Anxiety and Depression Association of America (ADAA): www.adaa.org Mental Health America: www.mentalhealthamerica.net National Alliance on Mental Illness: www.nami.org Contact a health care provider if: You stop taking your antidepressant medicines, and you have any of these symptoms: Nausea. Headache. Light-headedness. Chills and body aches. Not being able to sleep (insomnia). You or your friends and family think your depression is getting worse. Get help right away if: You have thoughts of hurting yourself or others. If you ever feel like you may hurt yourself or others, or have thoughts about taking your own life, get help right away. Go to your nearest emergency department or: Call your local emergency services (911 in the [place] Call a suicide crisis helpline, such as the National Suicide Prevention Lifeline at 1-800-273-8255. This is open 24 hours a day in the [place] Text the Crisis Text Line at 741741 (in the [place] Summary If you are diagnosed with depression, preparing yourself to manage your symptoms is a good way  to feel positive about your future. Work with your health care provider on a management plan that includes stress reduction techniques, medicines (if applicable), therapy, and healthy lifestyle habits. Keep talking with your health care provider about how your treatment is working. If you have thoughts about taking your own life, call a suicide crisis helpline or text a crisis text line. This information is not intended to replace advice given to you by your health care provider. Make sure you discuss any questions you have with your healthcare provider. Document Revised: 04/19/2019 Document Reviewed: 04/19/2019 Elsevier Patient Education  2022 Elsevier Inc.  

## 2021-01-10 NOTE — Progress Notes (Signed)
Subjective:    Patient ID: Amy Pham, female    DOB: [birth year], 30 y.o.   MRN: [id number]   Chief Complaint: upset stomach (Multiple bowel movements daily and stool is green/)   HPI Patient comes in today with 2 complaints: -  no energy. Does not feel like doing anything. Started about 1-2 months ago. Gets angry very easily.  Depression screen [facility] 2/9 01/10/2021 04/09/2020 05/14/2017  Decreased Interest 3 0 0  Down, Depressed, Hopeless 3 0 0  PHQ - 2 Score 6 0 0  Altered sleeping 3 - -  Tired, decreased energy 3 - -  Change in appetite 3 - -  Feeling bad or failure about yourself  3 - -  Trouble concentrating 3 - -  Moving slowly or fidgety/restless 1 - -  Suicidal thoughts 0 - -  PHQ-9 Score 22 - -  Difficult doing work/chores Very difficult - -   - she says that her bowel movements have been coming everyday. She says she sees warms in her boop. Anal area itches some.        Review of Systems  Constitutional:  Negative for diaphoresis.  Eyes:  Negative for pain.  Respiratory:  Negative for shortness of breath.   Cardiovascular:  Negative for chest pain, palpitations and leg swelling.  Gastrointestinal:  Negative for abdominal pain.  Endocrine: Negative for polydipsia.  Skin:  Negative for rash.  Neurological:  Negative for dizziness, weakness and headaches.  Hematological:  Does not bruise/bleed easily.  All other systems reviewed and are negative.     Objective:   Physical Exam Vitals and nursing note reviewed.  Constitutional:      Appearance: Normal appearance.  Cardiovascular:     Rate and Rhythm: Normal rate and regular rhythm.     Heart sounds: Normal heart sounds.  Pulmonary:     Breath sounds: Normal breath sounds.  Abdominal:     General: Bowel sounds are normal.     Palpations: Abdomen is soft.  Skin:    General: Skin is warm.  Neurological:     General: No focal deficit present.     Mental Status: She is alert and oriented to  person, place, and time.  Psychiatric:        Mood and Affect: Mood normal.        Behavior: Behavior normal.   BP 109/78   Pulse 79   Temp 98.1 F (36.7 C) (Temporal)   Resp 20   Ht 5\' 7"  (1.702 m)   Wt 142 lb (64.4 kg)   SpO2 98%   BMI 22.24 kg/m         Assessment & Plan:   in today with chief complaint of upset stomach (Multiple bowel movements daily and stool is green/)   1. Pinworms Do stool specimen before taking medication Everyone in house should be treated - albendazole (ALBENZA) 200 MG tablet; 2 pills today then repeat in 2 weeks/  Dispense: 4 tablet; Refill: 0 - Ova and parasite examination  2. Depression, major, single episode, moderate (HCC) Patient refuses to take medication She denies that she can be depressed despite depression screen scoreof 22.  3. Fatigue, unspecified type Labs pending - CBC with Differential/Platelet - Thyroid Panel With TSH    The above assessment and management plan was discussed with the patient. The patient verbalized understanding of and has agreed to the management plan. Patient is aware to call the clinic if symptoms persist or  worsen. Patient is aware when to return to the clinic for a follow-up visit. Patient educated on when it is appropriate to go to the emergency department.   Mary-Margaret Hassell Done, FNP

## 2021-01-11 LAB — THYROID PANEL WITH TSH
Free Thyroxine Index: 1.9 (ref 1.2–4.9)
T3 Uptake Ratio: 24 % (ref 24–39)
T4, Total: 8 ug/dL (ref 4.5–12.0)
TSH: 1.41 u[IU]/mL (ref 0.450–4.500)

## 2021-01-11 LAB — CBC WITH DIFFERENTIAL/PLATELET
Basophils Absolute: 0 10*3/uL (ref 0.0–0.2)
Basos: 0 %
EOS (ABSOLUTE): 0.1 10*3/uL (ref 0.0–0.4)
Eos: 1 %
Hematocrit: 39.2 % (ref 34.0–46.6)
Hemoglobin: 13.3 g/dL (ref 11.1–15.9)
Immature Grans (Abs): 0 10*3/uL (ref 0.0–0.1)
Immature Granulocytes: 0 %
Lymphocytes Absolute: 2.6 10*3/uL (ref 0.7–3.1)
Lymphs: 35 %
MCH: 32.2 pg (ref 26.6–33.0)
MCHC: 33.9 g/dL (ref 31.5–35.7)
MCV: 95 fL (ref 79–97)
Monocytes Absolute: 0.6 10*3/uL (ref 0.1–0.9)
Monocytes: 9 %
Neutrophils Absolute: 4.1 10*3/uL (ref 1.4–7.0)
Neutrophils: 55 %
Platelets: 197 10*3/uL (ref 150–450)
RBC: 4.13 x10E6/uL (ref 3.77–5.28)
RDW: 11 % — ABNORMAL LOW (ref 11.7–15.4)
WBC: 7.5 10*3/uL (ref 3.4–10.8)

## 2021-01-13 ENCOUNTER — Other Ambulatory Visit: Payer: Medicaid Other

## 2021-01-17 ENCOUNTER — Telehealth: Payer: Self-pay | Admitting: Nurse Practitioner

## 2021-01-17 NOTE — Telephone Encounter (Signed)
Left detailed message on patients voicemail that results are not back yet and we would contact her as soon as they do

## 2021-01-23 LAB — OVA AND PARASITE EXAMINATION

## 2021-02-13 IMAGING — MR MR ABDOMEN WO/W CM MRCP
18 of 20 series · 41 of 48 positions shown · IV contrast (gadavist)
Comparison: CT abdomen/pelvis dated 12/12/2013. Right upper
ultrasound dated 06/17/2020.

CLINICAL DATA: Abnormal LFTs, dilated CBD, intermittent right upper
quadrant pain

EXAM:
MRI ABDOMEN WITHOUT AND WITH CONTRAST (INCLUDING MRCP)
TECHNIQUE: Multiplanar multisequence MR imaging of the abdomen was performed
both before and after the administration of intravenous contrast.
Heavily T2-weighted images of the biliary and pancreatic ducts were
obtained, and three-dimensional MRCP images were rendered by post
processing.
CONTRAST:  7mL GADAVIST GADOBUTROL 1 MMOL/ML IV SOLN

[Series 3: T2 fat-sat · axial · 6.0mm · 0.94mm/px · 1 of 36 slices shown]
[im 1/36]
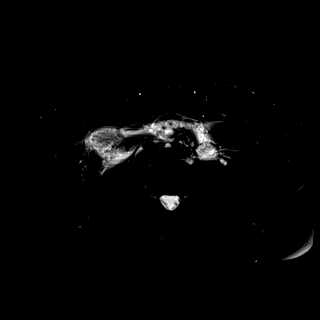

[Series 5: DWI · axial · 6.0mm · 1.36mm/px · z∈[-121,+131]mm · 3 of 72 slices shown (1 of 2)]
[im 1/72]
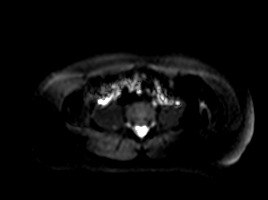
[im 36/72]
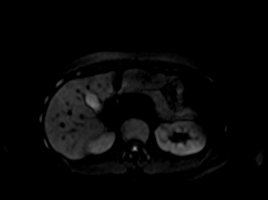
[im 72/72]
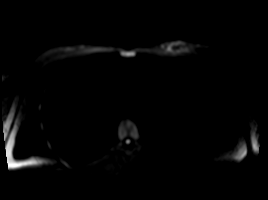

[Series 6: DWI · axial · 6.0mm · 1.36mm/px · 1 of 36 slices shown (2 of 2)]
[im 1/36]
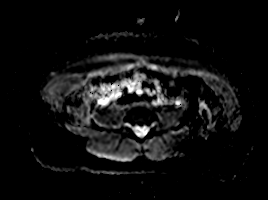

[Series 7: T1 · axial · 3.0mm · 0.94mm/px · z∈[-136,+101]mm · 3 of 80 slices shown (1 of 2)]
[im 1/80]
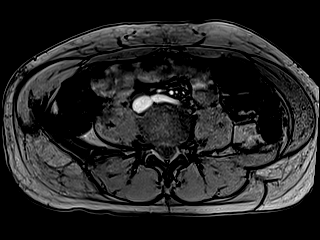
[im 40/80]
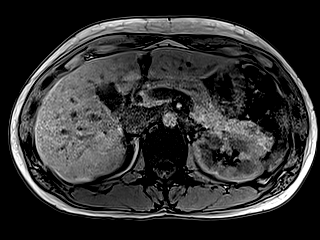
[im 80/80]
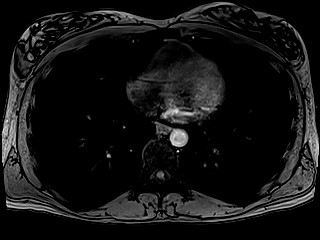

[Series 8: T1 · axial · 3.0mm · 0.94mm/px · z∈[-136,+101]mm · 3 of 80 slices shown (2 of 2)]
[im 1/80]
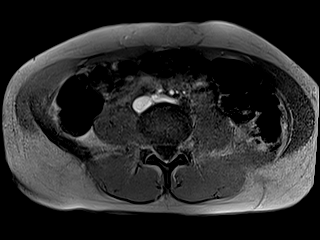
[im 40/80]
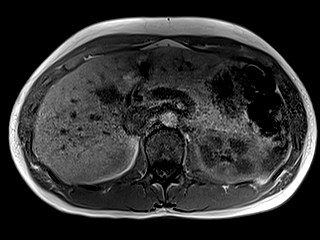
[im 80/80]
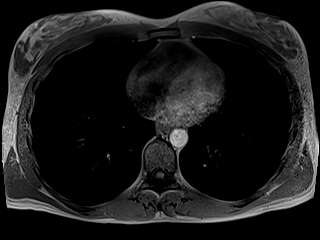

[Series 9: T2 · coronal · 6.0mm · 1.17mm/px · 1 of 28 slices shown (1 of 2)]
[im 1/28]
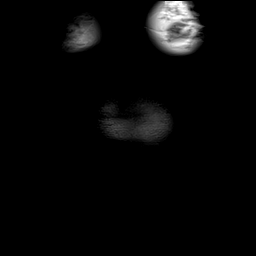

[Series 10: T2 · axial · 6.0mm · 1.17mm/px · 1 of 32 slices shown (2 of 2)]
[im 1/32]
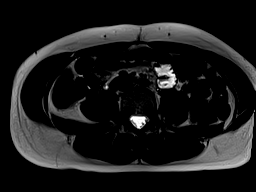

[Series 12: cor obl thk · sagittal · 50.0mm · 0.78mm/px · 1 of 9 slices shown]
[im 1/9]
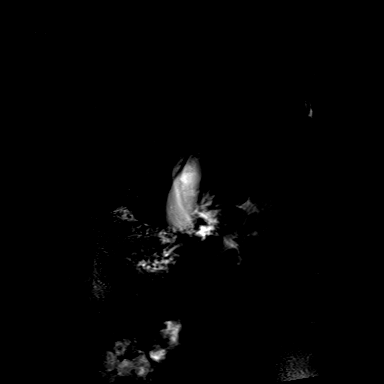

[Series 14: cor_3d_spc_trig · coronal · 1.0mm · 0.39mm/px · 3 of 72 slices shown]
[im 1/72]
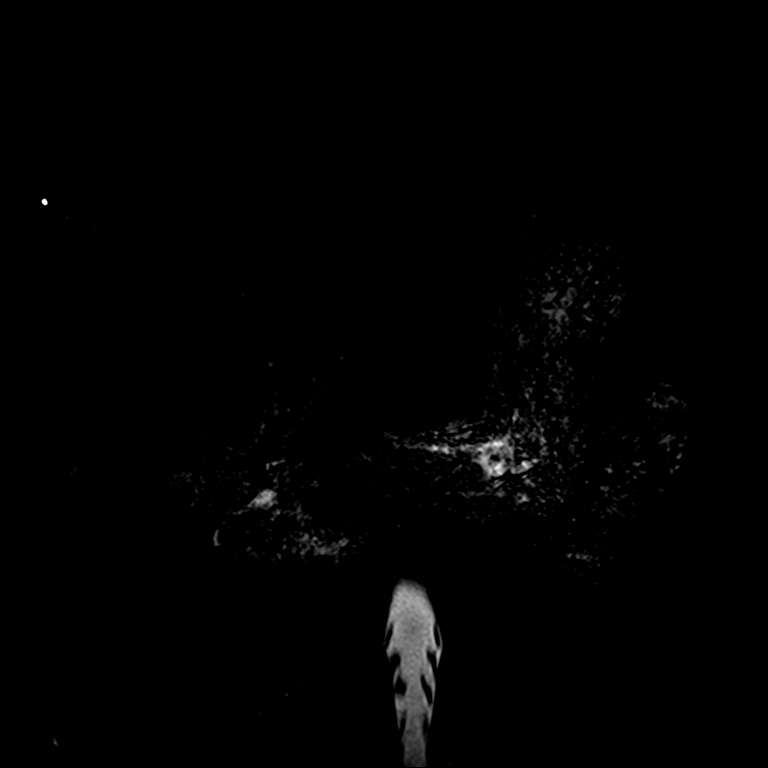
[im 36/72]
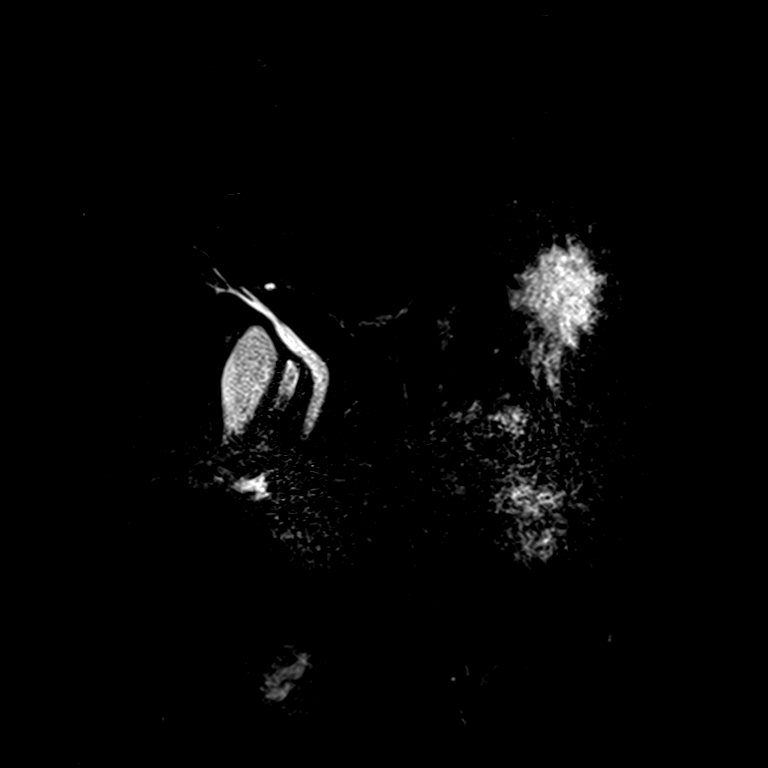
[im 72/72]
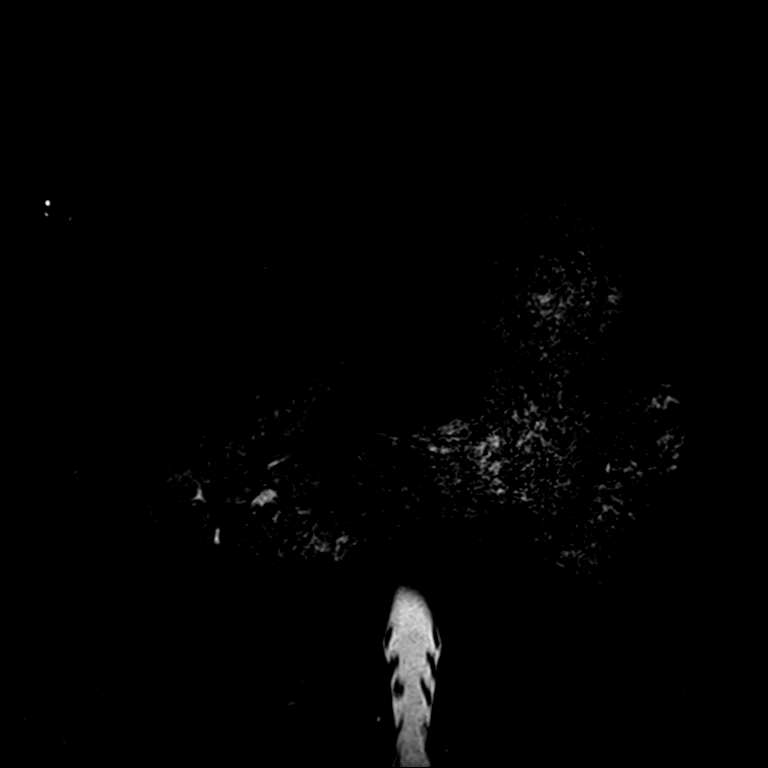

[Series 17: T1 dynamic · axial · 3.0mm · 0.94mm/px · z∈[-149,+112]mm · 3 of 88 slices shown (1 of 6)]
[im 1/88]
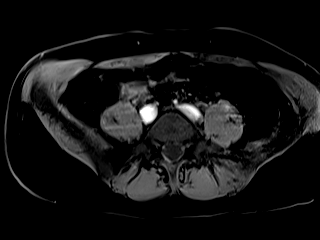
[im 44/88]
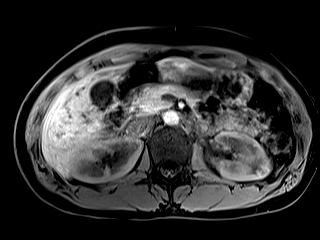
[im 88/88]
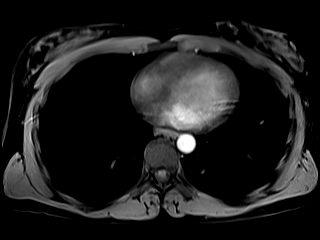

[Series 19: T1 dynamic · axial · 3.0mm · 0.94mm/px · z∈[-149,+112]mm · 3 of 88 slices shown (2 of 6)]
[im 1/88]
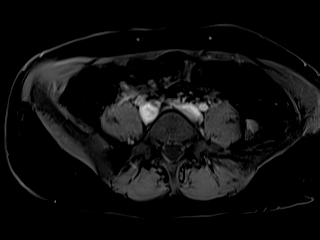
[im 44/88]
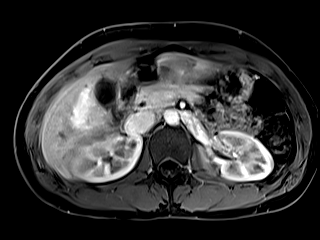
[im 88/88]
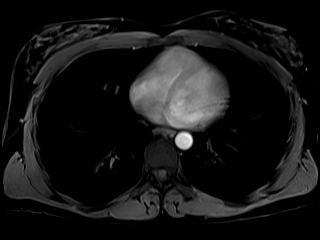

[Series 21: T1 dynamic · axial · 3.0mm · 0.94mm/px · z∈[-149,+112]mm · 3 of 88 slices shown (3 of 6)]
[im 1/88]
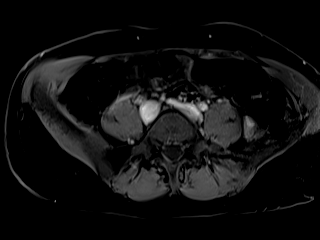
[im 44/88]
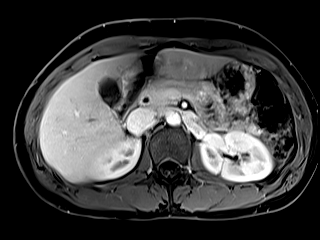
[im 88/88]
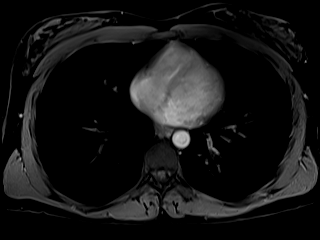

[Series 23: T1 dynamic · axial · 3.0mm · 0.94mm/px · z∈[-149,+112]mm · 3 of 88 slices shown (4 of 6)]
[im 1/88]
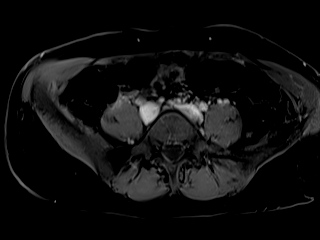
[im 44/88]
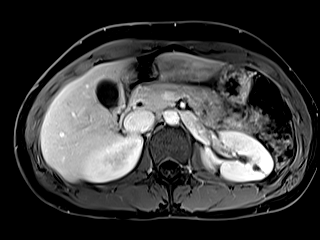
[im 88/88]
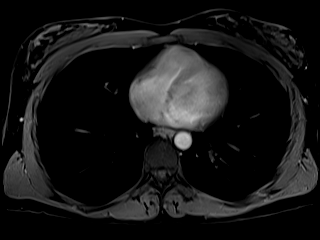

[Series 25: T1 dynamic · coronal · 3.0mm · 0.94mm/px · 2 of 64 slices shown (5 of 6)]
[im 1/64]
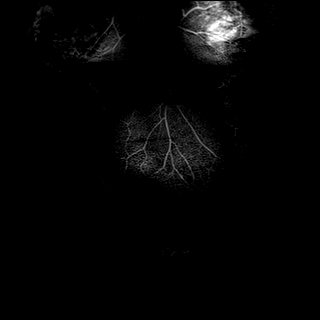
[im 64/64]
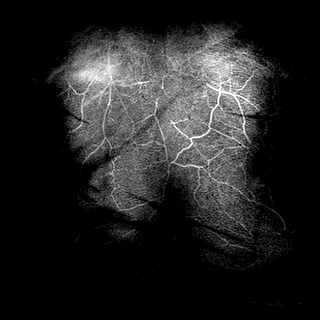

[Series 27: T1 dynamic · axial · 3.0mm · 0.94mm/px · z∈[-149,+112]mm · 3 of 88 slices shown (6 of 6)]
[im 1/88]
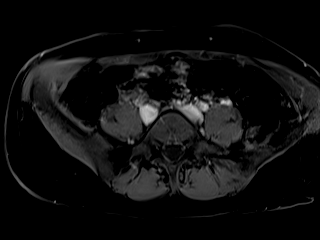
[im 44/88]
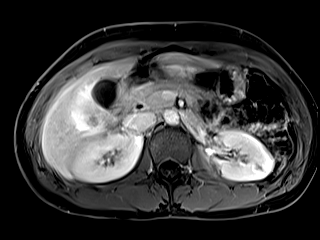
[im 88/88]
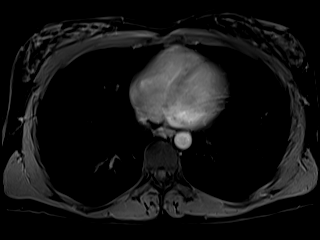

[Series 100: 20 sec sub · axial · 3.0mm · 0.94mm/px · z∈[-149,+112]mm · 3 of 86 slices shown]
[im 1/86]
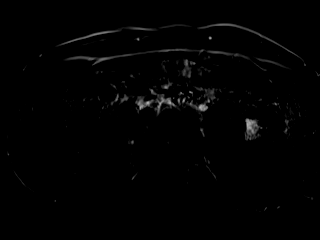
[im 43/86]
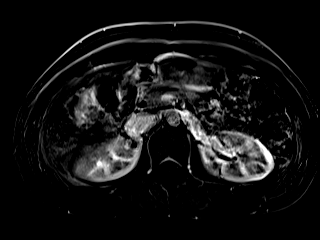
[im 86/86]
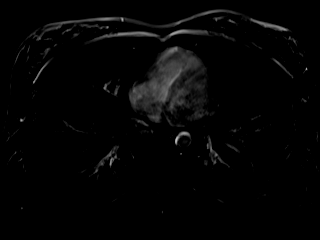

[Series 101: 45 sec sub · axial · 3.0mm · 0.94mm/px · z∈[-149,+112]mm · 3 of 88 slices shown]
[im 1/88]
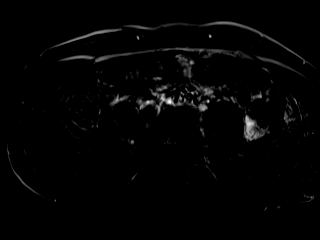
[im 44/88]
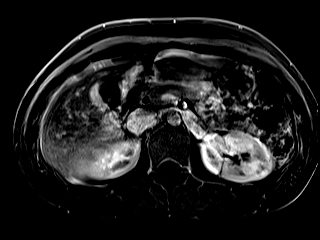
[im 88/88]
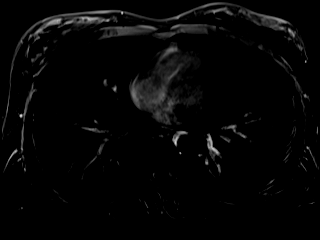

[Series 104: 90 sec sub · axial · 3.0mm · 0.94mm/px · 1 of 88 slices shown]
[im 1/88]
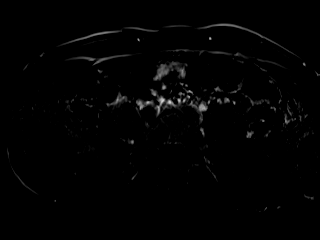

[41 of 48 positions shown; findings below may reference images not displayed]

FINDINGS: Lower chest: Lung bases are clear.

Hepatobiliary: Liver is within normal limits. No hepatic steatosis.
No suspicious/enhancing hepatic lesions.

Gallbladder is unremarkable. No intrahepatic or extrahepatic ductal
dilatation. Common duct measures 6 mm. No choledocholithiasis is
seen.

Pancreas:  Within normal limits.

Spleen:  Within normal limits.

Adrenals/Urinary Tract:  Adrenal glands are within normal limits.

Subcentimeter left renal cyst (series 3/image 20). Right kidney is
within normal limits. No hydronephrosis.

Stomach/Bowel: Stomach is within normal limits.

Visualized bowel is unremarkable.

Vascular/Lymphatic:  No evidence of abdominal aortic aneurysm.

No suspicious abdominal lymphadenopathy.

Other:  No abdominal ascites.

Musculoskeletal: No focal osseous lesions.
IMPRESSION: Negative MRI of the abdomen.

No intrahepatic or extrahepatic ductal dilatation. Common duct
measures 6 mm. No choledocholithiasis is seen.

## 2021-03-14 ENCOUNTER — Other Ambulatory Visit: Payer: Self-pay | Admitting: Family Medicine

## 2021-03-14 ENCOUNTER — Ambulatory Visit: Payer: Medicaid Other | Admitting: Family Medicine

## 2021-03-14 ENCOUNTER — Encounter: Payer: Self-pay | Admitting: Family Medicine

## 2021-03-14 DIAGNOSIS — R300 Dysuria: Secondary | ICD-10-CM | POA: Diagnosis not present

## 2021-03-14 DIAGNOSIS — N760 Acute vaginitis: Secondary | ICD-10-CM

## 2021-03-14 LAB — MICROSCOPIC EXAMINATION
RBC, Urine: NONE SEEN /hpf (ref 0–2)
Renal Epithel, UA: NONE SEEN /hpf
WBC, UA: NONE SEEN /hpf (ref 0–5)

## 2021-03-14 LAB — URINALYSIS, COMPLETE
Bilirubin, UA: NEGATIVE
Glucose, UA: NEGATIVE
Ketones, UA: NEGATIVE
Leukocytes,UA: NEGATIVE
Nitrite, UA: NEGATIVE
Protein,UA: NEGATIVE
RBC, UA: NEGATIVE
Specific Gravity, UA: 1.02 (ref 1.005–1.030)
Urobilinogen, Ur: 0.2 mg/dL (ref 0.2–1.0)
pH, UA: 6.5 (ref 5.0–7.5)

## 2021-03-14 MED ORDER — FLUCONAZOLE 150 MG PO TABS: ORAL_TABLET | ORAL | 0 refills | Status: DC

## 2021-03-14 NOTE — Addendum Note (Signed)
Addended by: Gabriel Earing on: 03/14/2021 04:40 PM   Modules accepted: Orders

## 2021-03-14 NOTE — Progress Notes (Signed)
   Virtual Visit  Note Due to COVID-19 pandemic this visit was conducted virtually. This visit type was conducted due to national recommendations for restrictions regarding the COVID-19 Pandemic (e.g. social distancing, sheltering in place) in an effort to limit this patient's exposure and mitigate transmission in our community. All issues noted in this document were discussed and addressed.  A physical exam was not performed with this format.  I connected with Clarisa Kindred on 03/14/21 at 1407 by telephone and verified that I am speaking with the correct person using two identifiers. Amy Pham is currently located at home and no one is currently with him during the visit. The provider, Gabriel Earing, FNP is located in their office at time of visit.  I discussed the limitations, risks, security and privacy concerns of performing an evaluation and management service by telephone and the availability of in person appointments. I also discussed with the patient that there may be a patient responsible charge related to this service. The patient expressed understanding and agreed to proceed.  CC: dysuria  History and Present Illness:  HPI Astha reports dysuria x 3 days. She also irritation and erythema to her vulva. It feels raw. She had itching but this has improved with AZO + yeast. She reports some mild frequency. Denies discharge, fever, chills, flank pain, abdominal pain, nausea, vomiting, diarrhea, or urgency. She has left urine for testing.    ROS As per HPI.   Observations/Objective: Return to office for new or worsening symptoms, or if symptoms persist.   Urine dipstick shows negative for all components.  Micro exam: few + bacteria, otherwise negative   Assessment and Plan: Devika was seen today for dysuria.  Diagnoses and all orders for this visit:  Acute vaginitis UA negative. Diflucan as below. Discussed return precautions.  -     fluconazole (DIFLUCAN)  150 MG tablet; Take 1 tablet by mouth once. May repeat in 3 days if symptoms persist.  Dysuria Negative UA. Culture pending.  -     Urine Culture    Follow Up Instructions: Return to office for new or worsening symptoms, or if symptoms persist.     I discussed the assessment and treatment plan with the patient. The patient was provided an opportunity to ask questions and all were answered. The patient agreed with the plan and demonstrated an understanding of the instructions.   The patient was advised to call back or seek an in-person evaluation if the symptoms worsen or if the condition fails to improve as anticipated.  The above assessment and management plan was discussed with the patient. The patient verbalized understanding of and has agreed to the management plan. Patient is aware to call the clinic if symptoms persist or worsen. Patient is aware when to return to the clinic for a follow-up visit. Patient educated on when it is appropriate to go to the emergency department.   Time call ended:  1419  I provided 12 minutes of  non face-to-face time during this encounter.    Gabriel Earing, FNP

## 2021-03-18 LAB — URINE CULTURE

## 2021-04-11 ENCOUNTER — Ambulatory Visit (INDEPENDENT_AMBULATORY_CARE_PROVIDER_SITE_OTHER): Payer: Medicaid Other | Admitting: Nurse Practitioner

## 2021-04-11 ENCOUNTER — Other Ambulatory Visit: Payer: Self-pay

## 2021-04-11 ENCOUNTER — Encounter: Payer: Self-pay | Admitting: Nurse Practitioner

## 2021-04-11 VITALS — BP 98/64 | HR 110 | Temp 98.1°F | Resp 20 | Ht 67.0 in | Wt 147.0 lb

## 2021-04-11 DIAGNOSIS — F320 Major depressive disorder, single episode, mild: Secondary | ICD-10-CM

## 2021-04-11 DIAGNOSIS — Z720 Tobacco use: Secondary | ICD-10-CM | POA: Diagnosis not present

## 2021-04-11 DIAGNOSIS — Z0001 Encounter for general adult medical examination with abnormal findings: Secondary | ICD-10-CM

## 2021-04-11 DIAGNOSIS — F411 Generalized anxiety disorder: Secondary | ICD-10-CM | POA: Diagnosis not present

## 2021-04-11 DIAGNOSIS — Z0000 Encounter for general adult medical examination without abnormal findings: Secondary | ICD-10-CM

## 2021-04-11 NOTE — Patient Instructions (Signed)
Major Depressive Disorder, Adult Major depressive disorder is a mental health condition. This disorder affects feelings. It can also affect the body. Symptoms of this condition last most of the day, almost every day, for 2 weeks. This disorder can affect: Relationships. Daily activities, such as work and school. Activities that you normally like to do. What are the causes? The cause of this condition is not known. The disorder is likely caused by a mix of things, including: Your personality, such as being a shy person. Your behavior, or how you act toward others. Your thoughts and feelings. Too much alcohol or drugs. How you react to stress. Health and mental problems that you have had for a long time. Things that hurt you in the past (trauma). Big changes in your life, such as divorce. What increases the risk? The following factors may make you more likely to develop this condition: Having family members with depression. Being a woman. Problems in the family. Low levels of some brain chemicals. Things that caused you pain as a child, especially if you lost a parent or were abused. A lot of stress in your life, such as from: Living without basic needs of life, such as food and shelter. Being treated poorly because of race, sex, or religion (discrimination). Health and mental problems that you have had for a long time. What are the signs or symptoms? The main symptoms of this condition are: Being sad all the time. Being grouchy all the time. Loss of interest in things and activities. Other symptoms include: Sleeping too much or too little. Eating too much or too little. Gaining or losing weight, without knowing why. Feeling tired or having low energy. Being restless and weak. Feeling hopeless, worthless, or guilty. Trouble thinking clearly or making decisions. Thoughts of hurting yourself or others, or thoughts of ending your life. Spending a lot of time alone. Inability to  complete common tasks of daily life. If you have very bad MDD, you may: Believe things that are not true. Hear, see, taste, or feel things that are not there. Have mild depression that lasts for at least 2 years. Feel very sad and hopeless. Have trouble speaking or moving. How is this treated? This condition may be treated with: Talk therapy. This teaches you to know bad thoughts, feelings, and actions and how to change them. This can also help you to communicate with others. This can be done with members of your family. Medicines. These can be used to treat worry (anxiety), depression, or low levels of chemicals in the brain. Lifestyle changes. You may need to: Limit alcohol use. Limit drug use. Get regular exercise. Get plenty of sleep. Make healthy eating choices. Spend more time outdoors. Brain stimulation. This treatment excites the brain. This is done when symptoms are very bad or have not gotten better with other treatments. Follow these instructions at home: Activity Get regular exercise as told. Spend time outdoors as told. Make time to do the things you enjoy. Find ways to deal with stress. Try to: Meditate. Do deep breathing. Spend time in nature. Keep a journal. Return to your normal activities as told by your doctor. Ask your doctor what activities are safe for you. Alcohol and drug use If you drink alcohol: Limit how much you use to: 0-1 drink a day for women. 0-2 drinks a day for men. Be aware of how much alcohol is in your drink. In the [place] one drink equals one 12 oz bottle of beer (355 mL),   one 5 oz glass of wine (148 mL), or one 1 oz glass of hard liquor (44 mL). Talk to your doctor about: Alcohol use. Alcohol can affect some medicines. Any drug use. General instructions  Take over-the-counter and prescription medicines and herbal preparations only as told by your doctor. Eat a healthy diet. Get a lot of sleep. Think about joining a support group.  Your doctor may be able to suggest one. Keep all follow-up visits as told by your doctor. This is important.  Where to find more information: National Alliance on Mental Illness: www.nami.org [place] National Institute of Mental Health: www.nimh.nih.gov American Psychiatric Association: www.psychiatry.org/patients-families/ Contact a doctor if: Your symptoms get worse. You get new symptoms. Get help right away if: You hurt yourself. You have serious thoughts about hurting yourself or others. You see, hear, taste, smell, or feel things that are not there. If you ever feel like you may hurt yourself or others, or have thoughts about taking your own life, get help right away. Go to your nearest emergency department or: Call your local emergency services (911 in the [place] Call a suicide crisis helpline, such as the National Suicide Prevention Lifeline at 1-800-273-8255. This is open 24 hours a day in the [place] Text the Crisis Text Line at 741741 (in the [place] Summary Major depressive disorder is a mental health condition. This disorder affects feelings. Symptoms of this condition last most of the day, almost every day, for 2 weeks. The symptoms of this disorder can cause problems with relationships and with daily activities. There are treatments and support for people who get this disorder. You may need more than one type of treatment. Get help right away if you have serious thoughts about hurting yourself or others. This information is not intended to replace advice given to you by your health care provider. Make sure you discuss any questions you have with your healthcare provider. Document Revised: 05/20/2019 Document Reviewed: 05/20/2019 Elsevier Patient Education  2022 Elsevier Inc.  

## 2021-04-11 NOTE — Progress Notes (Addendum)
Subjective:    Patient ID: Amy Pham, female    DOB: October 07, 1990, 30 y.o.   MRN: [id number]  Chief Complaint: medical management of chronic issues     HPI:  1. Annual physical exam Suppose to have pap today but started her menses yesterday.  2. Depression, major, single episode, mild (HCC) On no meds . Patient tried to OD on meds several years ago and does not want to take anything. Depression screen [facility] 2/9 01/10/2021 04/09/2020 05/14/2017  Decreased Interest 3 0 0  Down, Depressed, Hopeless 3 0 0  PHQ - 2 Score 6 0 0  Altered sleeping 3 - -  Tired, decreased energy 3 - -  Change in appetite 3 - -  Feeling bad or failure about yourself  3 - -  Trouble concentrating 3 - -  Moving slowly or fidgety/restless 1 - -  Suicidal thoughts 0 - -  PHQ-9 Score 22 - -  Difficult doing work/chores Very difficult - -     3. Generalized anxiety disorder On no meds GAD 7 : Generalized Anxiety Score 04/11/2021  Nervous, Anxious, on Edge 3  Control/stop worrying 2  Worry too much - different things 2  Trouble relaxing 0  Restless 0  Easily annoyed or irritable 0  Afraid - awful might happen 2  Total GAD 7 Score 9  Anxiety Difficulty Somewhat difficult        4. Tobacco abuse Smokes over a pack a day    Outpatient Encounter Medications as of 04/11/2021  Medication Sig   [DISCONTINUED] albendazole (ALBENZA) 200 MG tablet 2 pills today then repeat in 2 weeks/   [DISCONTINUED] fluconazole (DIFLUCAN) 150 MG tablet Take 1 tablet by mouth once. May repeat in 3 days if symptoms persist.   No facility-administered encounter medications on file as of 04/11/2021.    Past Surgical History:  Procedure Laterality Date   CESAREAN SECTION  05/31/2011   Procedure: CESAREAN SECTION;  Surgeon: Michael Litter, MD;  Location: [facility];  Service: Gynecology;  Laterality: N/A;  Primary cesarean section with delivery of baby boy at 45. apgars 8/9.   CESAREAN SECTION N/A 07/19/2013    Procedure: CESAREAN SECTION;  Surgeon: Adam Phenix, MD;  Location: [facility];  Service: Obstetrics;  Laterality: N/A;   LAPAROSCOPIC TUBAL LIGATION Bilateral 02/21/2014   Procedure: LAPAROSCOPIC TUBAL LIGATION;  Surgeon: Adam Phenix, MD;  Location: [facility];  Service: Gynecology;  Laterality: Bilateral;   TONSILLECTOMY     2007    Family History  Problem Relation Age of Onset   Depression Mother    Mental illness Father    Diabetes Maternal Uncle    Arthritis Maternal Grandmother    Cancer Maternal Grandmother    Heart disease Paternal Grandfather    Liver disease Paternal Uncle        unknown etiology   Colon cancer Neg Hx     New complaints: None today  Social history: Lives with her fiance  Controlled substance contract: n/a     Review of Systems  Constitutional:  Negative for diaphoresis.  Eyes:  Negative for pain.  Respiratory:  Negative for shortness of breath.   Cardiovascular:  Negative for chest pain, palpitations and leg swelling.  Gastrointestinal:  Negative for abdominal pain.  Endocrine: Negative for polydipsia.  Skin:  Negative for rash.  Neurological:  Negative for dizziness, weakness and headaches.  Hematological:  Does not bruise/bleed easily.  All other systems reviewed and are negative.  Objective:   Physical Exam Vitals and nursing note reviewed.  Constitutional:      General: She is not in acute distress.    Appearance: Normal appearance. She is well-developed.  HENT:     Head: Normocephalic.     Right Ear: Tympanic membrane normal.     Left Ear: Tympanic membrane normal.     Nose: Nose normal.     Mouth/Throat:     Mouth: Mucous membranes are moist.  Eyes:     Pupils: Pupils are equal, round, and reactive to light.  Neck:     Vascular: No carotid bruit or JVD.  Cardiovascular:     Rate and Rhythm: Normal rate and regular rhythm.     Heart sounds: Normal heart sounds.  Pulmonary:     Effort: Pulmonary effort is normal. No  respiratory distress.     Breath sounds: Normal breath sounds. No wheezing or rales.  Chest:     Chest wall: No tenderness.  Abdominal:     General: Bowel sounds are normal. There is no distension or abdominal bruit.     Palpations: Abdomen is soft. There is no hepatomegaly, splenomegaly, mass or pulsatile mass.     Tenderness: There is no abdominal tenderness.  Musculoskeletal:        General: Normal range of motion.     Cervical back: Normal range of motion and neck supple.  Lymphadenopathy:     Cervical: No cervical adenopathy.  Skin:    General: Skin is warm and dry.  Neurological:     Mental Status: She is alert and oriented to person, place, and time.     Deep Tendon Reflexes: Reflexes are normal and symmetric.  Psychiatric:        Behavior: Behavior normal.        Thought Content: Thought content normal.        Judgment: Judgment normal.    BP 98/64   Pulse (!) 110   Temp 98.1 F (36.7 C) (Temporal)   Resp 20   Ht 5\' 7"  (1.702 m)   Wt 147 lb (66.7 kg)   SpO2 100%   BMI 23.02 kg/m        Assessment & Plan:  Amy Pham comes in today with chief complaint of Annual Exam   Diagnosis and orders addressed:  1. Annual physical exam Needs pap in future  2. Depression, major, single episode, mild (HCC) Refuses medication Has signed up for counseling  3. Generalized anxiety disorder Has signe up for counseling  4. Tobacco abuse Smoking cessation encouraged   Labs pending Health Maintenance reviewed Diet and exercise encouraged  Follow up plan: prn   Mary-Margaret Amy Kindred, FNP

## 2021-04-12 LAB — THYROID PANEL WITH TSH
Free Thyroxine Index: 2.3 (ref 1.2–4.9)
T3 Uptake Ratio: 26 % (ref 24–39)
T4, Total: 8.8 ug/dL (ref 4.5–12.0)
TSH: 1.24 u[IU]/mL (ref 0.450–4.500)

## 2021-04-12 LAB — LIPID PANEL
Chol/HDL Ratio: 2.3 ratio (ref 0.0–4.4)
Cholesterol, Total: 154 mg/dL (ref 100–199)
HDL: 66 mg/dL (ref >39)
LDL Chol Calc (NIH): 79 mg/dL (ref 0–99)
Triglycerides: 39 mg/dL (ref 0–149)
VLDL Cholesterol Cal: 9 mg/dL (ref 5–40)

## 2021-04-12 LAB — CMP14+EGFR
ALT: 53 IU/L — ABNORMAL HIGH (ref 0–32)
AST: 31 IU/L (ref 0–40)
Albumin/Globulin Ratio: 1.9 (ref 1.2–2.2)
Albumin: 4.5 g/dL (ref 3.9–5.0)
Alkaline Phosphatase: 81 IU/L (ref 44–121)
BUN/Creatinine Ratio: 13 (ref 9–23)
BUN: 11 mg/dL (ref 6–20)
Bilirubin Total: 0.3 mg/dL (ref 0.0–1.2)
CO2: 24 mmol/L (ref 20–29)
Calcium: 9.1 mg/dL (ref 8.7–10.2)
Chloride: 103 mmol/L (ref 96–106)
Creatinine, Ser: 0.82 mg/dL (ref 0.57–1.00)
Globulin, Total: 2.4 g/dL (ref 1.5–4.5)
Glucose: 79 mg/dL (ref 70–99)
Potassium: 4.3 mmol/L (ref 3.5–5.2)
Sodium: 138 mmol/L (ref 134–144)
Total Protein: 6.9 g/dL (ref 6.0–8.5)
eGFR: 99 mL/min/{1.73_m2} (ref >59)

## 2021-04-12 LAB — CBC WITH DIFFERENTIAL/PLATELET
Basophils Absolute: 0 10*3/uL (ref 0.0–0.2)
Basos: 0 %
EOS (ABSOLUTE): 0.2 10*3/uL (ref 0.0–0.4)
Eos: 2 %
Hematocrit: 41.7 % (ref 34.0–46.6)
Hemoglobin: 13.8 g/dL (ref 11.1–15.9)
Immature Grans (Abs): 0 10*3/uL (ref 0.0–0.1)
Immature Granulocytes: 0 %
Lymphocytes Absolute: 2.6 10*3/uL (ref 0.7–3.1)
Lymphs: 34 %
MCH: 32.5 pg (ref 26.6–33.0)
MCHC: 33.1 g/dL (ref 31.5–35.7)
MCV: 98 fL — ABNORMAL HIGH (ref 79–97)
Monocytes Absolute: 0.9 10*3/uL (ref 0.1–0.9)
Monocytes: 12 %
Neutrophils Absolute: 4 10*3/uL (ref 1.4–7.0)
Neutrophils: 52 %
Platelets: 202 10*3/uL (ref 150–450)
RBC: 4.25 x10E6/uL (ref 3.77–5.28)
RDW: 12.1 % (ref 11.7–15.4)
WBC: 7.7 10*3/uL (ref 3.4–10.8)

## 2021-04-24 ENCOUNTER — Other Ambulatory Visit: Payer: Self-pay

## 2021-04-24 ENCOUNTER — Other Ambulatory Visit (HOSPITAL_COMMUNITY)
Admission: RE | Admit: 2021-04-24 | Discharge: 2021-04-24 | Disposition: A | Discharge status: Home / Self Care | Payer: Medicaid Other | Source: Ambulatory Visit | Attending: Nurse Practitioner | Admitting: Nurse Practitioner

## 2021-04-24 ENCOUNTER — Encounter: Payer: Self-pay | Admitting: Nurse Practitioner

## 2021-04-24 ENCOUNTER — Ambulatory Visit (INDEPENDENT_AMBULATORY_CARE_PROVIDER_SITE_OTHER): Payer: Medicaid Other | Admitting: Nurse Practitioner

## 2021-04-24 VITALS — BP 109/74 | HR 86 | Temp 98.2°F | Resp 20 | Ht 67.0 in | Wt 147.0 lb

## 2021-04-24 DIAGNOSIS — R87610 Atypical squamous cells of undetermined significance on cytologic smear of cervix (ASC-US): Secondary | ICD-10-CM | POA: Insufficient documentation

## 2021-04-24 NOTE — Progress Notes (Signed)
   Subjective:    Patient ID: Amy Pham, female    DOB: Feb 01, 1991, 30 y.o.   MRN: [id number]  Chief Complaint: PAP only  HPI Patient comes in today for PAP only. She was seen on 04/11/21 fr complete physical exam but she was on her period and did not want to have pap. Her last pap was in 2021 and was ASCUS. She denies any vaginal discharge or any other issues.    Review of Systems  Constitutional:  Negative for diaphoresis.  Eyes:  Negative for pain.  Respiratory:  Negative for shortness of breath.   Cardiovascular:  Negative for chest pain, palpitations and leg swelling.  Gastrointestinal:  Negative for abdominal pain.  Endocrine: Negative for polydipsia.  Skin:  Negative for rash.  Neurological:  Negative for dizziness, weakness and headaches.  Hematological:  Does not bruise/bleed easily.  All other systems reviewed and are negative.     Objective:   Physical Exam Constitutional:      Appearance: Normal appearance.  Cardiovascular:     Rate and Rhythm: Normal rate and regular rhythm.     Heart sounds: Normal heart sounds.  Pulmonary:     Breath sounds: Normal breath sounds.  Genitourinary:    General: Normal vulva.     Vagina: No vaginal discharge.     Rectum: Normal.     Comments: Cervix parous and pink No adnexal masses or tenderness Skin:    General: Skin is warm.  Neurological:     General: No focal deficit present.     Mental Status: She is oriented to person, place, and time.  Psychiatric:        Mood and Affect: Mood normal.        Behavior: Behavior normal.    BP 109/74   Pulse 86   Temp 98.2 F (36.8 C) (Temporal)   Resp 20   Ht 5\' 7"  (1.702 m)   Wt 147 lb (66.7 kg)   BMI 23.02 kg/m         Assessment & Plan:   in today with chief complaint of Gynecologic Exam   1. Atypical squamous cells of undetermined significance on cytologic smear of cervix (ASC-US) Results pending - Cytology - PAP    The above  assessment and management plan was discussed with the patient. The patient verbalized understanding of and has agreed to the management plan. Patient is aware to call the clinic if symptoms persist or worsen. Patient is aware when to return to the clinic for a follow-up visit. Patient educated on when it is appropriate to go to the emergency department.   Mary-Margaret Amy Kindred, FNP

## 2021-04-24 NOTE — Patient Instructions (Signed)
Pap Test °Why am I having this test? °A Pap test, also called a Pap smear, is a screening test to check for signs of: °Infection. °Cancer of the cervix. The cervix is the lower part of the uterus that opens into the vagina. °Changes that may be a sign that cancer is developing (precancerous changes). °Women need this test on a regular basis. In general, you should have a Pap test every 3 years until you reach menopause or age 30. Women aged 30-60 may choose to have their Pap test done at the same time as an HPV (human papillomavirus) test every 5 years (instead of every 3 years). °Your health care provider may recommend having Pap tests more or less often depending on your medical conditions and past Pap test results. °What is being tested? °Cervical cells are tested for signs of infection or abnormalities. °What kind of sample is taken? °Your health care provider will collect a sample of cells from the surface of your cervix. This will be done using a small cotton swab, plastic spatula, or brush that is inserted into your vagina using a tool called a speculum. This sample is often collected during a pelvic exam, when you are lying on your back on an exam table with your feet in footrests (stirrups). In some cases, fluids (secretions) from the cervix or vagina may also be collected. °How do I prepare for this test? °Be aware of where you are in your menstrual cycle. If you are menstruating on the day of the test, you may be asked to reschedule. °You may need to reschedule if you have a known vaginal infection on the day of the test. °Follow instructions from your health care provider about: °Changing or stopping your regular medicines. Some medicines can cause abnormal test results, such as vaginal medicines and tetracycline. °Avoiding douching 2-3 days before or the day of the test. °Tell a health care provider about: °Any allergies you have. °All medicines you are taking, including vitamins, herbs, eye drops,  creams, and over-the-counter medicines. °Any bleeding problems you have. °Any surgeries you have had. °Any medical conditions you have. °Whether you are pregnant or may be pregnant. °How are the results reported? °Your test results will be reported as either abnormal or normal. °What do the results mean? °A normal test result means that you do not have signs of cancer of the cervix. °An abnormal result may mean that you have: °Cancer. A Pap test by itself is not enough to diagnose cancer. You will have more tests done if cancer is suspected. °Precancerous changes in your cervix. °Inflammation of the cervix. °An STI (sexually transmitted infection). °A fungal infection. °A parasite infection. °Talk with your health care provider about what your results mean. In some cases, your health care provider may do more testing to confirm the results. °Questions to ask your health care provider °Ask your health care provider, or the department that is doing the test: °When will my results be ready? °How will I get my results? °What are my treatment options? °What other tests do I need? °What are my next steps? °Summary °In general, women should have a Pap test every 3 years until they reach menopause or age 30. °Your health care provider will collect a sample of cells from the surface of your cervix. This will be done using a small cotton swab, plastic spatula, or brush. °In some cases, fluids (secretions) from the cervix or vagina may also be collected. °This information is not intended   to replace advice given to you by your health care provider. Make sure you discuss any questions you have with your health care provider. °Document Revised: 09/06/2020 Document Reviewed: 09/06/2020 °Elsevier Patient Education © 2022 Elsevier Inc. ° °

## 2021-04-30 LAB — CYTOLOGY - PAP
Chlamydia: NEGATIVE
Comment: NEGATIVE
Comment: NEGATIVE
Comment: NORMAL
Diagnosis: NEGATIVE
Neisseria Gonorrhea: NEGATIVE
Trichomonas: NEGATIVE

## 2021-07-01 ENCOUNTER — Encounter: Payer: Self-pay | Admitting: Nurse Practitioner

## 2021-07-01 ENCOUNTER — Ambulatory Visit: Payer: Medicaid Other | Admitting: Nurse Practitioner

## 2021-07-01 VITALS — BP 109/71 | HR 101 | Temp 98.4°F | Resp 20 | Ht 67.0 in | Wt 149.0 lb

## 2021-07-01 DIAGNOSIS — B1920 Unspecified viral hepatitis C without hepatic coma: Secondary | ICD-10-CM

## 2021-07-01 NOTE — Patient Instructions (Signed)
Hepatitis C °Hepatitis C is a liver infection that is caused by a virus. Many people have no symptoms or only mild symptoms. Hepatitis C is contagious. This means that it can spread from person to person. Over time, the infection can lead to serious liver problems. °What are the causes? °This condition is caused by a virus. It can spread through: °Contact with body fluids from someone who has the virus. This includes: °Blood. °Semen or vaginal fluids. °Childbirth. A woman can pass the virus to her baby during birth. °Blood or organ donations that were done in the [place] before 1992. °What increases the risk? °Having contact with needles or syringes that have the virus on them. This may happen when you: °Get acupuncture. This is a treatment that puts thin needles through your skin. °Get a tattoo or body piercing. °Inject drugs. °Having sex with someone who has the virus. The virus can be passed through vaginal, oral, or anal sex. °Getting treatment to clean your blood (kidney dialysis). °Having HIV or AIDS. °Having a job that puts you in contact with blood or body fluids. °What are the signs or symptoms? °You feel very tired. °You are not hungry. °You feel like you may vomit or you vomit. °You have pain in your belly or fluid builds up in your belly. °Your pee is dark yellow. °Your skin or the white parts of your eyes look yellow (jaundice). °Your skin is itchy. °Your poop is light in color. °You have joint pain. °You bleed or bruise often. °Often, there are no symptoms. °How is this treated? °Treatment may depend on your condition and whether you have liver damage. Treatment may include: °Taking antiviral medicines and other medicines. °Having follow-up treatments every 6-12 months for liver problems. °Getting a new liver from a donor. This is called a liver transplant. °Follow these instructions at home: °Medicines °Take over-the-counter and prescription medicines only as told by your doctor. °If you were  given an antiviral medicine, take it as told by your doctor. Do not stop using the antiviral even if you start to feel better. °Do not take any new medicines unless your doctor says that this is okay. This includes over-the-counter medicines and supplements. °Activity °Rest as needed. °Do not have sex until your doctor says this is okay. °Do not swim or use hot tubs if you have open sores or wounds. °Return to your normal activities as told by your doctor. Ask your doctor what activities are safe for you. °Ask your doctor when you may go back to school or work. °Eating and drinking ° °Eat a balanced diet. Eat plenty of: °Fruits and vegetables. °Whole grains. °Lean meats or non-meat proteins, such as beans or tofu. °Drink enough fluid to keep your pee pale yellow. °Do not drink alcohol. °General instructions °Do not share toothbrushes, nail clippers, or razors. °Wash your hands often with soap and water for at least 20 seconds. If you do not have soap and water, use hand sanitizer. °Cover any cuts or open sores on your skin. °Keep all follow-up visits. You may need follow-up visits every 6-12 months. °How is this prevented? °Wash your hands often with soap and water for at least 20 seconds. °Do not share needles or syringes. °Use a condom every time you have vaginal, oral, or anal sex. Latex condoms should be used, if possible. °Do not handle blood or body fluids without gloves or other protection. °Avoid getting tattoos or piercings in shops that are not clean. °Where to   find more information °Centers for Disease Control and Prevention: www.cdc.gov °World Health Organization: www.who.int °Contact a doctor if: °You have a fever or chills. °You have belly pain. °Your pee is dark. °Your poop is a light color or tan. °You have joint pain. °Get help right away if: °You feel more tired. °You do not feel like eating. °You cannot eat or drink without vomiting. °Your skin or the whites of your eyes turn yellow or turn more  yellow than they were before. °You bruise or bleed easily. °Summary °Hepatitis C is a liver infection that is caused by a virus. °Over time, this can lead to serious liver problems. °The virus is contagious. This means that it can spread from person to person through blood, semen, or vaginal fluids. °Do not take any new medicines unless your doctor says that this is okay. °This information is not intended to replace advice given to you by your health care provider. Make sure you discuss any questions you have with your health care provider. °Document Revised: 04/25/2020 Document Reviewed: 04/25/2020 °Elsevier Patient Education © 2022 [employer] ° °

## 2021-07-01 NOTE — Progress Notes (Signed)
° °  Subjective:    Patient ID: Amy Pham, female    DOB: [birth year], 31 y.o.   MRN: [id number]  Chief Complaint: Wants referral for hepttitis treatment   HPI Patient was tested for hepatitis C in 05/2020. Was positive, so she was referred to gastro. She went but she never completed treatment. Needs to have dental surgery and they wanted her hepatitis C treated.     Review of Systems  Respiratory: Negative.    Cardiovascular: Negative.   Gastrointestinal: Negative.   Neurological: Negative.       Objective:   Physical Exam Vitals reviewed.  Constitutional:      Appearance: Normal appearance.  Cardiovascular:     Rate and Rhythm: Normal rate and regular rhythm.     Heart sounds: Normal heart sounds.  Pulmonary:     Effort: Pulmonary effort is normal.     Breath sounds: Normal breath sounds.  Skin:    General: Skin is warm.  Neurological:     General: No focal deficit present.     Mental Status: She is alert and oriented to person, place, and time.  Psychiatric:        Mood and Affect: Mood normal.        Behavior: Behavior normal.    BP 109/71    Pulse (!) 101    Temp 98.4 F (36.9 C) (Temporal)    Resp 20    Ht 5\' 7"  (1.702 m)    Wt 149 lb (67.6 kg)    SpO2 100%    BMI 23.34 kg/m        Assessment & Plan:  Amy Pham in today with chief complaint of Wants referral for hepttitis treatment   1. Hepatitis C test positive Referral made - Ambulatory referral to Gastroenterology    The above assessment and management plan was discussed with the patient. The patient verbalized understanding of and has agreed to the management plan. Patient is aware to call the clinic if symptoms persist or worsen. Patient is aware when to return to the clinic for a follow-up visit. Patient educated on when it is appropriate to go to the emergency department.   Mary-Margaret Hassell Done, FNP

## 2021-07-07 ENCOUNTER — Encounter: Payer: Self-pay | Admitting: Internal Medicine

## 2021-07-22 NOTE — H&P (Addendum)
°  Patient referred by DDS for extraction teeth 1, 2, 12, 13, 14, 16, 17, 18, 21, 31, 32.   CC: On and off pain.   HPI: Pt scheduled for surgery in office with IV sedation but unable to obtain IV access.  Past Medical History:  Asthma, Hepatitis C, Herpes, Drug Abuse/Alcohol Abuse, Bruise Easily, Delay in healing    Medications: Amoxicillin, Valtrex    Allergies:     Zestril    Surgeries:   C Section, Tonsillectomy                                Exam: BMI 28. Caries teeth # 2, 12, 13, 14, 18, 21, 31, impacted 1, 16, 17, 32. No purulence, edema, fluctuance, trismus. Oral cancer screening negative. Pharynx clear. Mallampati 1. No lymphadenopathy.  Panorex: Caries teeth #  2, 12, 13, 14, 18, 21, 31, impacted 1, 16, 17, 32.  Assessment:  ASA 2 . Non-restorable teeth#  1, 2, 12, 13, 14, 16, 17, 18, 21, 31, 32.            Plan: Extraction Teeth #  1, 2, 12, 13, 14, 16, 17, 18, 21, 31, 32.  IV Sedation.                Rx: none             Risks and complications explained. Questions answered.   Gae Bon, DMD

## 2021-07-23 ENCOUNTER — Encounter (HOSPITAL_COMMUNITY): Payer: Self-pay | Admitting: Oral Surgery

## 2021-07-24 ENCOUNTER — Other Ambulatory Visit: Payer: Self-pay

## 2021-07-24 ENCOUNTER — Encounter (HOSPITAL_COMMUNITY): Payer: Self-pay | Admitting: Oral Surgery

## 2021-07-24 NOTE — Progress Notes (Signed)
PCP - Shelah Lewandowsky, FNP Cardiologist - denies  ERAS Protcol -  COVID TEST-   Anesthesia review: n/a  -------------  SDW INSTRUCTIONS:  Your procedure is scheduled on Friday 2/3. Please report to [facility] Main Entrance "A" at [place].M., and check in at the Admitting office. Call this number if you have problems the morning of surgery: [phone]   Remember: Do not eat or drink after midnight the night before your surgery   Medications to take morning of surgery with a sip of water include: NONE   As of today, STOP taking any Aspirin (unless otherwise instructed by your surgeon), Aleve, Naproxen, Ibuprofen, Motrin, Advil, Goody's, BC's, all herbal medications, fish oil, and all vitamins.    The Morning of Surgery Do not wear jewelry, make-up or nail polish. Do not wear lotions, powders, or perfumes or deodorant Do not bring valuables to the hospital. [facility] is not responsible for any belongings or valuables.  If you are a smoker, DO NOT Smoke 24 hours prior to surgery  If you wear a CPAP at night please bring your mask the morning of surgery   Remember that you must have someone to transport you home after your surgery, and remain with you for 24 hours if you are discharged the same day.  Please bring cases for contacts, glasses, hearing aids, dentures or bridgework because it cannot be worn into surgery.   Patients discharged the day of surgery will not be allowed to drive home.   Please shower the NIGHT BEFORE/MORNING OF SURGERY (use antibacterial soap like DIAL soap if possible). Wear comfortable clothes the morning of surgery. Oral Hygiene is also important to reduce your risk of infection.  Remember - BRUSH YOUR TEETH THE MORNING OF SURGERY WITH YOUR REGULAR TOOTHPASTE  Patient denies shortness of breath, fever, cough and chest pain.

## 2021-07-25 ENCOUNTER — Ambulatory Visit (HOSPITAL_COMMUNITY): Payer: Medicaid Other | Admitting: Anesthesiology

## 2021-07-25 ENCOUNTER — Encounter (HOSPITAL_COMMUNITY)
Admission: RE | Disposition: A | Discharge status: Home / Self Care | Payer: Self-pay | Source: Home / Self Care | Attending: Oral Surgery

## 2021-07-25 ENCOUNTER — Encounter (HOSPITAL_COMMUNITY): Payer: Self-pay | Admitting: Oral Surgery

## 2021-07-25 ENCOUNTER — Other Ambulatory Visit: Payer: Self-pay

## 2021-07-25 ENCOUNTER — Ambulatory Visit (HOSPITAL_COMMUNITY)
Admission: RE | Admit: 2021-07-25 | Discharge: 2021-07-25 | Disposition: A | Discharge status: Home / Self Care | Payer: Medicaid Other | Attending: Oral Surgery | Admitting: Oral Surgery

## 2021-07-25 DIAGNOSIS — K029 Dental caries, unspecified: Secondary | ICD-10-CM | POA: Insufficient documentation

## 2021-07-25 DIAGNOSIS — F17210 Nicotine dependence, cigarettes, uncomplicated: Secondary | ICD-10-CM | POA: Diagnosis not present

## 2021-07-25 DIAGNOSIS — K0889 Other specified disorders of teeth and supporting structures: Secondary | ICD-10-CM | POA: Insufficient documentation

## 2021-07-25 DIAGNOSIS — K011 Impacted teeth: Secondary | ICD-10-CM | POA: Diagnosis not present

## 2021-07-25 HISTORY — DX: Inflammatory liver disease, unspecified: K75.9

## 2021-07-25 HISTORY — DX: Other psychoactive substance abuse, uncomplicated: F19.10

## 2021-07-25 HISTORY — DX: Other specified postprocedural states: Z98.890

## 2021-07-25 HISTORY — PX: TOOTH EXTRACTION: SHX859

## 2021-07-25 HISTORY — DX: Nausea with vomiting, unspecified: R11.2

## 2021-07-25 LAB — CBC
HCT: 44.4 % (ref 36.0–46.0)
Hemoglobin: 15 g/dL (ref 12.0–15.0)
MCH: 33 pg (ref 26.0–34.0)
MCHC: 33.8 g/dL (ref 30.0–36.0)
MCV: 97.6 fL (ref 80.0–100.0)
Platelets: 179 10*3/uL (ref 150–400)
RBC: 4.55 MIL/uL (ref 3.87–5.11)
RDW: 12.5 % (ref 11.5–15.5)
WBC: 7.4 10*3/uL (ref 4.0–10.5)
nRBC: 0 % (ref 0.0–0.2)

## 2021-07-25 LAB — COMPREHENSIVE METABOLIC PANEL
ALT: 64 U/L — ABNORMAL HIGH (ref 0–44)
AST: 47 U/L — ABNORMAL HIGH (ref 15–41)
Albumin: 3.9 g/dL (ref 3.5–5.0)
Alkaline Phosphatase: 51 U/L (ref 38–126)
Anion gap: 10 (ref 5–15)
BUN: 8 mg/dL (ref 6–20)
CO2: 24 mmol/L (ref 22–32)
Calcium: 8.8 mg/dL — ABNORMAL LOW (ref 8.9–10.3)
Chloride: 102 mmol/L (ref 98–111)
Creatinine, Ser: 0.77 mg/dL (ref 0.44–1.00)
GFR, Estimated: >60 mL/min (ref >60)
Glucose, Bld: 87 mg/dL (ref 70–99)
Potassium: 4.4 mmol/L (ref 3.5–5.1)
Sodium: 136 mmol/L (ref 135–145)
Total Bilirubin: 1.2 mg/dL (ref 0.3–1.2)
Total Protein: 6.9 g/dL (ref 6.5–8.1)

## 2021-07-25 LAB — PROTIME-INR
INR: 1 (ref 0.8–1.2)
Prothrombin Time: 12.9 seconds (ref 11.4–15.2)

## 2021-07-25 LAB — POCT PREGNANCY, URINE: Preg Test, Ur: NEGATIVE

## 2021-07-25 SURGERY — DENTAL RESTORATION/EXTRACTIONS
Anesthesia: General

## 2021-07-25 MED ORDER — OXYCODONE HCL 5 MG PO TABS: 5.0000 mg | ORAL_TABLET | ORAL | 0 refills | Status: DC | PRN

## 2021-07-25 MED ORDER — HYDROMORPHONE HCL 1 MG/ML IJ SOLN: 0.2500 mg | INTRAMUSCULAR | Status: DC | PRN

## 2021-07-25 MED ORDER — PROPOFOL 10 MG/ML IV BOLUS
INTRAVENOUS | Status: AC
  Filled 2021-07-25: qty 20

## 2021-07-25 MED ORDER — CLINDAMYCIN PHOSPHATE 600 MG/50ML IV SOLN
INTRAVENOUS | Status: AC
  Filled 2021-07-25: qty 50

## 2021-07-25 MED ORDER — 0.9 % SODIUM CHLORIDE (POUR BTL) OPTIME
TOPICAL | Status: DC | PRN
  Administered 2021-07-25: 1000 mL

## 2021-07-25 MED ORDER — ONDANSETRON HCL 4 MG/2ML IJ SOLN
INTRAMUSCULAR | Status: DC | PRN
Start: 2021-07-25 — End: 2021-07-25
  Administered 2021-07-25: 4 mg via INTRAVENOUS

## 2021-07-25 MED ORDER — OXYCODONE HCL 5 MG/5ML PO SOLN: 5.0000 mg | Freq: Once | ORAL | Status: DC | PRN

## 2021-07-25 MED ORDER — LIDOCAINE-EPINEPHRINE 2 %-1:100000 IJ SOLN
INTRAMUSCULAR | Status: DC | PRN
  Administered 2021-07-25: 20 mL

## 2021-07-25 MED ORDER — ORAL CARE MOUTH RINSE: 15.0000 mL | Freq: Once | OROMUCOSAL | Status: AC

## 2021-07-25 MED ORDER — LIDOCAINE-EPINEPHRINE 2 %-1:100000 IJ SOLN
INTRAMUSCULAR | Status: AC
  Filled 2021-07-25: qty 1

## 2021-07-25 MED ORDER — DEXAMETHASONE SODIUM PHOSPHATE 10 MG/ML IJ SOLN
INTRAMUSCULAR | Status: AC
  Filled 2021-07-25: qty 1

## 2021-07-25 MED ORDER — FENTANYL CITRATE (PF) 250 MCG/5ML IJ SOLN
INTRAMUSCULAR | Status: AC
  Filled 2021-07-25: qty 5

## 2021-07-25 MED ORDER — CLINDAMYCIN PHOSPHATE 600 MG/50ML IV SOLN
600.0000 mg | INTRAVENOUS | Status: AC
  Administered 2021-07-25: 600 mg via INTRAVENOUS

## 2021-07-25 MED ORDER — DEXMEDETOMIDINE (PRECEDEX) IN NS 20 MCG/5ML (4 MCG/ML) IV SYRINGE
PREFILLED_SYRINGE | INTRAVENOUS | Status: DC | PRN
  Administered 2021-07-25: 4 ug via INTRAVENOUS
  Administered 2021-07-25: 12 ug via INTRAVENOUS

## 2021-07-25 MED ORDER — PROPOFOL 10 MG/ML IV BOLUS
INTRAVENOUS | Status: DC | PRN
  Administered 2021-07-25: 160 mg via INTRAVENOUS

## 2021-07-25 MED ORDER — AMOXICILLIN 500 MG PO CAPS: 500.0000 mg | ORAL_CAPSULE | Freq: Three times a day (TID) | ORAL | 0 refills | Status: DC

## 2021-07-25 MED ORDER — ONDANSETRON HCL 4 MG/2ML IJ SOLN: 4.0000 mg | Freq: Once | INTRAMUSCULAR | Status: DC | PRN

## 2021-07-25 MED ORDER — CHLORHEXIDINE GLUCONATE 0.12 % MT SOLN: 15.0000 mL | Freq: Once | OROMUCOSAL | Status: AC

## 2021-07-25 MED ORDER — CHLORHEXIDINE GLUCONATE 0.12 % MT SOLN
OROMUCOSAL | Status: AC
  Administered 2021-07-25: 15 mL via OROMUCOSAL
  Filled 2021-07-25: qty 15

## 2021-07-25 MED ORDER — ROCURONIUM BROMIDE 10 MG/ML (PF) SYRINGE
PREFILLED_SYRINGE | INTRAVENOUS | Status: DC | PRN
Start: 2021-07-25 — End: 2021-07-25
  Administered 2021-07-25: 50 mg via INTRAVENOUS

## 2021-07-25 MED ORDER — PHENYLEPHRINE 40 MCG/ML (10ML) SYRINGE FOR IV PUSH (FOR BLOOD PRESSURE SUPPORT)
PREFILLED_SYRINGE | INTRAVENOUS | Status: AC
  Filled 2021-07-25: qty 10

## 2021-07-25 MED ORDER — DEXAMETHASONE SODIUM PHOSPHATE 10 MG/ML IJ SOLN
INTRAMUSCULAR | Status: DC | PRN
Start: 2021-07-25 — End: 2021-07-25
  Administered 2021-07-25: 10 mg via INTRAVENOUS

## 2021-07-25 MED ORDER — LIDOCAINE 2% (20 MG/ML) 5 ML SYRINGE
INTRAMUSCULAR | Status: DC | PRN
Start: 2021-07-25 — End: 2021-07-25
  Administered 2021-07-25: 100 mg via INTRAVENOUS

## 2021-07-25 MED ORDER — ACETAMINOPHEN 10 MG/ML IV SOLN: 1000.0000 mg | Freq: Once | INTRAVENOUS | Status: DC | PRN

## 2021-07-25 MED ORDER — ONDANSETRON HCL 4 MG/2ML IJ SOLN
INTRAMUSCULAR | Status: AC
  Filled 2021-07-25: qty 2

## 2021-07-25 MED ORDER — PHENYLEPHRINE 40 MCG/ML (10ML) SYRINGE FOR IV PUSH (FOR BLOOD PRESSURE SUPPORT)
PREFILLED_SYRINGE | INTRAVENOUS | Status: DC | PRN
  Administered 2021-07-25: 80 ug via INTRAVENOUS

## 2021-07-25 MED ORDER — MIDAZOLAM HCL 2 MG/2ML IJ SOLN
INTRAMUSCULAR | Status: AC
  Filled 2021-07-25: qty 2

## 2021-07-25 MED ORDER — MIDAZOLAM HCL 2 MG/2ML IJ SOLN
INTRAMUSCULAR | Status: DC | PRN
Start: 2021-07-25 — End: 2021-07-25
  Administered 2021-07-25: 2 mg via INTRAVENOUS

## 2021-07-25 MED ORDER — SUGAMMADEX SODIUM 200 MG/2ML IV SOLN
INTRAVENOUS | Status: DC | PRN
  Administered 2021-07-25: 200 mg via INTRAVENOUS

## 2021-07-25 MED ORDER — OXYCODONE HCL 5 MG PO TABS: 5.0000 mg | ORAL_TABLET | Freq: Once | ORAL | Status: DC | PRN

## 2021-07-25 MED ORDER — PHENYLEPHRINE HCL-NACL 20-0.9 MG/250ML-% IV SOLN
INTRAVENOUS | Status: DC | PRN
  Administered 2021-07-25: 20 ug/min via INTRAVENOUS

## 2021-07-25 MED ORDER — SODIUM CHLORIDE 0.9 % IR SOLN
Status: DC | PRN
  Administered 2021-07-25: 1000 mL

## 2021-07-25 MED ORDER — ROCURONIUM BROMIDE 10 MG/ML (PF) SYRINGE
PREFILLED_SYRINGE | INTRAVENOUS | Status: AC
  Filled 2021-07-25: qty 10

## 2021-07-25 MED ORDER — AMISULPRIDE (ANTIEMETIC) 5 MG/2ML IV SOLN: 10.0000 mg | Freq: Once | INTRAVENOUS | Status: DC | PRN

## 2021-07-25 MED ORDER — FENTANYL CITRATE (PF) 250 MCG/5ML IJ SOLN
INTRAMUSCULAR | Status: DC | PRN
  Administered 2021-07-25: 100 ug via INTRAVENOUS
  Administered 2021-07-25: 50 ug via INTRAVENOUS

## 2021-07-25 MED ORDER — LACTATED RINGERS IV SOLN: INTRAVENOUS | Status: DC

## 2021-07-25 MED ORDER — IBUPROFEN 800 MG PO TABS: 800.0000 mg | ORAL_TABLET | Freq: Three times a day (TID) | ORAL | 0 refills | Status: DC | PRN

## 2021-07-25 MED ORDER — LIDOCAINE 2% (20 MG/ML) 5 ML SYRINGE
INTRAMUSCULAR | Status: AC
  Filled 2021-07-25: qty 5

## 2021-07-25 SURGICAL SUPPLY — 34 items
BLADE SURG 15 STRL LF DISP TIS (BLADE) ×1 IMPLANT
BLADE SURG 15 STRL SS (BLADE) ×1
BUR CROSS CUT FISSURE 1.6 (BURR) ×2 IMPLANT
BUR EGG ELITE 4.0 (BURR) ×2 IMPLANT
CANISTER SUCT 3000ML PPV (MISCELLANEOUS) ×2 IMPLANT
COVER SURGICAL LIGHT HANDLE (MISCELLANEOUS) ×2 IMPLANT
DRAPE U-SHAPE 76X120 STRL (DRAPES) ×2 IMPLANT
GAUZE PACKING FOLDED 2  STR (GAUZE/BANDAGES/DRESSINGS) ×1
GAUZE PACKING FOLDED 2 STR (GAUZE/BANDAGES/DRESSINGS) ×1 IMPLANT
GLOVE SURG ENC MOIS LTX SZ6.5 (GLOVE) IMPLANT
GLOVE SURG ENC MOIS LTX SZ7 (GLOVE) IMPLANT
GLOVE SURG ENC MOIS LTX SZ8 (GLOVE) ×2 IMPLANT
GLOVE SURG UNDER POLY LF SZ6.5 (GLOVE) IMPLANT
GLOVE SURG UNDER POLY LF SZ7 (GLOVE) IMPLANT
GOWN STRL REUS W/ TWL LRG LVL3 (GOWN DISPOSABLE) ×1 IMPLANT
GOWN STRL REUS W/ TWL XL LVL3 (GOWN DISPOSABLE) ×1 IMPLANT
GOWN STRL REUS W/TWL LRG LVL3 (GOWN DISPOSABLE) ×1
GOWN STRL REUS W/TWL XL LVL3 (GOWN DISPOSABLE) ×1
IV NS 1000ML (IV SOLUTION) ×1
IV NS 1000ML BAXH (IV SOLUTION) ×1 IMPLANT
KIT BASIN OR (CUSTOM PROCEDURE TRAY) ×2 IMPLANT
KIT TURNOVER KIT B (KITS) ×2 IMPLANT
NDL HYPO 25GX1X1/2 BEV (NEEDLE) ×2 IMPLANT
NEEDLE HYPO 25GX1X1/2 BEV (NEEDLE) ×4 IMPLANT
NS IRRIG 1000ML POUR BTL (IV SOLUTION) ×2 IMPLANT
PAD ARMBOARD 7.5X6 YLW CONV (MISCELLANEOUS) ×2 IMPLANT
SLEEVE IRRIGATION ELITE 7 (MISCELLANEOUS) ×2 IMPLANT
SPONGE SURGIFOAM ABS GEL 12-7 (HEMOSTASIS) IMPLANT
SUT CHROMIC 3 0 PS 2 (SUTURE) ×3 IMPLANT
SYR BULB IRRIG 60ML STRL (SYRINGE) ×2 IMPLANT
SYR CONTROL 10ML LL (SYRINGE) ×2 IMPLANT
TRAY ENT MC OR (CUSTOM PROCEDURE TRAY) ×2 IMPLANT
TUBING IRRIGATION (MISCELLANEOUS) ×2 IMPLANT
YANKAUER SUCT BULB TIP NO VENT (SUCTIONS) ×2 IMPLANT

## 2021-07-25 NOTE — Op Note (Signed)
NAME: Amy Pham, ONOFRE MEDICAL RECORD NO: [id number] ACCOUNT NO: [id number] DATE OF BIRTH: 07-Sep-1990 FACILITY: [facility] LOCATION: [facility] PHYSICIAN: Georgia Lopes, DDS  Operative Report   DATE OF PROCEDURE: 07/25/2021   PREOPERATIVE DIAGNOSIS:  Nonrestorable teeth numbers 2, 12, 13, 14, 18, 21, 31, 32 secondary to dental caries, impacted teeth 1, 16, 17.  POSTOPERATIVE DIAGNOSIS:  Nonrestorable teeth numbers 2, 12, 13, 14, 18, 21, 31, 32 secondary to dental caries, impacted teeth 1, 16, 17.   PROCEDURE PERFORMED:  Extraction teeth 1, 2, 12, 13, 14, 16, 17, 18, 21, 31, 32.  SURGEON:  Georgia Lopes, DDS  ANESTHESIA:  General nasal intubation, Dr. Alben Deeds attending.  DESCRIPTION OF PROCEDURE:  The patient was taken to the operating room and placed on the table in supine position.  General anesthesia was administered.  A nasal endotracheal tube was placed and secured.  The eyes were protected and the patient was  draped for surgery.  A timeout was performed.  The posterior pharynx was suctioned and a throat pack was placed.  2% lidocaine with 1:100,000 epinephrine was infiltrated in inferior alveolar block on the right and left sides and the buccal and palatal  infiltration around the maxillary teeth.  The bite block was placed on the right side of the mouth, a sweetheart retractor was used to retract the tongue.  A 15 blade used to make an incision, soft tissue overlying tooth #17.  It was carried forward  around tooth #18 and a separate incision was created in the gingival sulcus around tooth #21.  The periosteum was reflected from these teeth.  Bone was removed around teeth #17 and 18.  The teeth were elevated and removed with dental forceps.  Tooth #21  was removed in a simple fashion with dental forceps and the sockets were curetted, irrigated and closed with 3-0 chromic in the left maxilla.  A 15 blade was used to make an incision overlying tooth #16.  Then, another incision was  created in the buccal  and palatal sulcus of teeth #12, 13 and 14.  The periosteum was reflected.  Tooth #16 was elevated and removed with 301 elevator.  Teeth #12, 13 and 14 were grossly decayed and required removal of inner proximal bone in order to get purchase on the teeth  to elevate and remove them.  Once the teeth were removed, the sockets were curetted, irrigated and closed with 3-0 chromic.  The bite block and sweetheart retractors were repositioned to the other side of the mouth, a 15 blade was used to make an  incision around teeth #31 and 32 and around teeth #1 and 2.  Periosteum was reflected in the mandible and bone was removed around teeth #31 and 32.  Tooth #31 was sectioned and the roots were removed individually.  Then, the sockets were curetted and  irrigated with 3-0 chromic in the maxilla, 301 elevator was used to elevate tooth #1.  Tooth #2 required sectioning of the roots and removal of the roots individually with a rongeur.  Then, these sockets were curetted, irrigated and closed with 3-0  chromic.  The oral cavity was then irrigated and suctioned.  Throat pack was removed.  The patient was left under the care of anesthesia for extubation and transported to recovery unit.  Plans for discharge home through day surgery.  EBL was minimal.   There were no complications.  There were no specimens.     [email or web address] D: 07/25/2021 11:21:11 am T: 07/25/2021 10:54:00  pm  JOB: [id number]

## 2021-07-25 NOTE — Anesthesia Procedure Notes (Signed)
Procedure Name: Intubation Date/Time: 07/25/2021 10:29 AM Performed by: Lorie Phenix, CRNA Pre-anesthesia Checklist: Patient identified, Emergency Drugs available, Suction available and Patient being monitored Patient Re-evaluated:Patient Re-evaluated prior to induction Oxygen Delivery Method: Circle system utilized Preoxygenation: Pre-oxygenation with 100% oxygen Induction Type: IV induction Ventilation: Mask ventilation without difficulty Laryngoscope Size: Mac and 3 Grade View: Grade I Nasal Tubes: Left, Nasal prep performed, Nasal Rae and Magill forceps- large, utilized Tube size: 6.5 mm Number of attempts: 1 Placement Confirmation: ETT inserted through vocal cords under direct vision, positive ETCO2 and breath sounds checked- equal and bilateral Tube secured with: Tape Dental Injury: Teeth and Oropharynx as per pre-operative assessment

## 2021-07-25 NOTE — Anesthesia Postprocedure Evaluation (Signed)
Anesthesia Post Note  Patient: DHIYA SMITS  Procedure(s) Performed: DENTAL RESTORATION/EXTRACTIONS     Patient location during evaluation: PACU Anesthesia Type: General Level of consciousness: awake and alert Pain management: pain level controlled Vital Signs Assessment: post-procedure vital signs reviewed and stable Respiratory status: spontaneous breathing, nonlabored ventilation, respiratory function stable and patient connected to nasal cannula oxygen Cardiovascular status: blood pressure returned to baseline and stable Postop Assessment: no apparent nausea or vomiting Anesthetic complications: no   No notable events documented.  Last Vitals:  Vitals:   07/25/21 1230 07/25/21 1245  BP: 97/71 99/72  Pulse: 85 86  Resp: 15 18  Temp:  36.5 C  SpO2: 100% 99%    Last Pain:  Vitals:   07/25/21 1245  PainSc: 3                  Trevor Iha

## 2021-07-25 NOTE — H&P (Signed)
H&P documentation  -History and Physical Reviewed  -Patient has been re-examined  -No change in the plan of care  Amy Pham  

## 2021-07-25 NOTE — Op Note (Signed)
07/25/2021  11:15 AM  PATIENT:  Amy Pham  31 y.o. female  PRE-OPERATIVE DIAGNOSIS: NON-RESTORABLE TEETH # 2, 12, 13, 14, 18, 21, 31, 32 SECONDARY TO DENTAL CARIES, IMPACTED TEETH # 1, 16, 17  POST-OPERATIVE DIAGNOSIS:  SAME  PROCEDURE:  Procedure(s): EXTRACTION TEETH # 1, 2, 12, 13, 14, 16, 17, 18, 21, 31, 32.  SURGEON:  Surgeon(s): Ocie Doyne, DMD  ANESTHESIA:   local and general  EBL:  minimal  DRAINS: none   SPECIMEN:  No Specimen  COUNTS:  YES  PLAN OF CARE: Discharge to home after PACU  PATIENT DISPOSITION:  PACU - hemodynamically stable.   PROCEDURE DETAILS: Dictation # [id number]  Georgia Lopes, DMD 07/25/2021 11:15 AM

## 2021-07-25 NOTE — Transfer of Care (Signed)
Immediate Anesthesia Transfer of Care Note  Patient: Amy Pham  Procedure(s) Performed: DENTAL RESTORATION/EXTRACTIONS  Patient Location: PACU  Anesthesia Type:General  Level of Consciousness: drowsy  Airway & Oxygen Therapy: Patient Spontanous Breathing and Patient connected to face mask oxygen  Post-op Assessment: Report given to RN and Post -op Vital signs reviewed and stable  Post vital signs: Reviewed and stable  Last Vitals:  Vitals Value Taken Time  BP 103/59 07/25/21 1129  Temp    Pulse 90 07/25/21 1130  Resp 15 07/25/21 1130  SpO2 100 % 07/25/21 1130  Vitals shown include unvalidated device data.  Last Pain:  Vitals:   07/25/21 0839  PainSc: 5       Patients Stated Pain Goal: 0 ([id number])  Complications: No notable events documented.

## 2021-07-25 NOTE — Anesthesia Preprocedure Evaluation (Signed)
Anesthesia Evaluation  Patient identified by MRN, date of birth, ID band Patient awake    Reviewed: Allergy & Precautions, NPO status , Patient's Chart, lab work & pertinent test results  History of Anesthesia Complications (+) PONV and history of anesthetic complications  Airway Mallampati: I  TM Distance: >3 FB Neck ROM: Full    Dental no notable dental hx. (+) Poor Dentition, Dental Advisory Given,    Pulmonary Current SmokerPatient did not abstain from smoking.,    Pulmonary exam normal breath sounds clear to auscultation       Cardiovascular negative cardio ROS Normal cardiovascular exam Rhythm:Regular Rate:Normal     Neuro/Psych  Headaches, PSYCHIATRIC DISORDERS Anxiety Depression    GI/Hepatic negative GI ROS, (+)     substance abuse (hx)  , Hepatitis -  Endo/Other  negative endocrine ROS  Renal/GU      Musculoskeletal negative musculoskeletal ROS (+)   Abdominal   Peds  Hematology Lab Results      Component                Value               Date                      WBC                      7.4                 07/25/2021                HGB                      15.0                07/25/2021                HCT                      44.4                07/25/2021                MCV                      97.6                07/25/2021                PLT                      179                 07/25/2021              Anesthesia Other Findings All: cefzil  Reproductive/Obstetrics negative OB ROS                             Anesthesia Physical Anesthesia Plan  ASA: 2  Anesthesia Plan: General   Post-op Pain Management: Dilaudid IV   Induction: Intravenous  PONV Risk Score and Plan:   Airway Management Planned: Nasal ETT and Oral ETT  Additional Equipment: None  Intra-op Plan:   Post-operative Plan: Extubation in OR  Informed Consent: I have reviewed the patients  History and Physical, chart, labs and discussed the  procedure including the risks, benefits and alternatives for the proposed anesthesia with the patient or authorized representative who has indicated his/her understanding and acceptance.     Dental advisory given  Plan Discussed with: CRNA and Anesthesiologist  Anesthesia Plan Comments:         Anesthesia Quick Evaluation

## 2021-07-26 ENCOUNTER — Encounter (HOSPITAL_COMMUNITY): Payer: Self-pay | Admitting: Oral Surgery

## 2021-09-09 ENCOUNTER — Ambulatory Visit: Payer: Medicaid Other | Admitting: Internal Medicine

## 2021-09-30 ENCOUNTER — Ambulatory Visit (INDEPENDENT_AMBULATORY_CARE_PROVIDER_SITE_OTHER): Payer: Medicaid Other | Admitting: Internal Medicine

## 2021-09-30 ENCOUNTER — Encounter: Payer: Self-pay | Admitting: Internal Medicine

## 2021-09-30 ENCOUNTER — Other Ambulatory Visit: Payer: Medicaid Other

## 2021-09-30 VITALS — BP 110/64 | HR 85 | Temp 97.7°F | Ht 67.0 in | Wt 155.2 lb

## 2021-09-30 DIAGNOSIS — R768 Other specified abnormal immunological findings in serum: Secondary | ICD-10-CM | POA: Diagnosis not present

## 2021-09-30 DIAGNOSIS — R7989 Other specified abnormal findings of blood chemistry: Secondary | ICD-10-CM | POA: Diagnosis not present

## 2021-09-30 DIAGNOSIS — R7689 Other specified abnormal immunological findings in serum: Secondary | ICD-10-CM

## 2021-09-30 NOTE — Patient Instructions (Addendum)
It was nice meeting you today! ? ?We need the following lab work done today: ? ?INR, Chem-12, CBC ? ?HIV screen ? ?HCV RNA level: Reflex to genotyping if positive ? ?Hepatitis B core antibody ? ?Hepatitis B surface antigen ? ?Hepatitis B surface antibody ? ?Hepatitis A total antibody ? ?Further recommendations to follow once we get the above labs back for review. ?
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
## 2021-10-03 LAB — COMPREHENSIVE METABOLIC PANEL
ALT: 54 IU/L — ABNORMAL HIGH (ref 0–32)
AST: 38 IU/L (ref 0–40)
Albumin/Globulin Ratio: 1.8 (ref 1.2–2.2)
Albumin: 4.4 g/dL (ref 3.9–5.0)
Alkaline Phosphatase: 100 IU/L (ref 44–121)
BUN/Creatinine Ratio: 14 (ref 9–23)
BUN: 10 mg/dL (ref 6–20)
Bilirubin Total: 0.3 mg/dL (ref 0.0–1.2)
CO2: 24 mmol/L (ref 20–29)
Calcium: 9.1 mg/dL (ref 8.7–10.2)
Chloride: 101 mmol/L (ref 96–106)
Creatinine, Ser: 0.72 mg/dL (ref 0.57–1.00)
Globulin, Total: 2.5 g/dL (ref 1.5–4.5)
Glucose: 103 mg/dL — ABNORMAL HIGH (ref 70–99)
Potassium: 4.2 mmol/L (ref 3.5–5.2)
Sodium: 138 mmol/L (ref 134–144)
Total Protein: 6.9 g/dL (ref 6.0–8.5)
eGFR: 115 mL/min/{1.73_m2} (ref >59)

## 2021-10-03 LAB — HEPATITIS B SURFACE ANTIBODY,QUALITATIVE: Hep B Surface Ab, Qual: REACTIVE

## 2021-10-03 LAB — CBC WITH DIFFERENTIAL/PLATELET
Basophils Absolute: 0 10*3/uL (ref 0.0–0.2)
Basos: 1 %
EOS (ABSOLUTE): 0.1 10*3/uL (ref 0.0–0.4)
Eos: 2 %
Hematocrit: 42.6 % (ref 34.0–46.6)
Hemoglobin: 14.4 g/dL (ref 11.1–15.9)
Immature Grans (Abs): 0 10*3/uL (ref 0.0–0.1)
Immature Granulocytes: 0 %
Lymphocytes Absolute: 2.4 10*3/uL (ref 0.7–3.1)
Lymphs: 40 %
MCH: 32.7 pg (ref 26.6–33.0)
MCHC: 33.8 g/dL (ref 31.5–35.7)
MCV: 97 fL (ref 79–97)
Monocytes Absolute: 0.6 10*3/uL (ref 0.1–0.9)
Monocytes: 9 %
Neutrophils Absolute: 3 10*3/uL (ref 1.4–7.0)
Neutrophils: 48 %
Platelets: 188 10*3/uL (ref 150–450)
RBC: 4.41 x10E6/uL (ref 3.77–5.28)
RDW: 11.4 % — ABNORMAL LOW (ref 11.7–15.4)
WBC: 6.1 10*3/uL (ref 3.4–10.8)

## 2021-10-03 LAB — HCV RNA QUANT RFLX ULTRA OR GENOTYP
HCV Quant Baseline: 9350000 IU/mL
HCV log10: 6.971 log10 IU/mL

## 2021-10-03 LAB — HEPATITIS B SURFACE ANTIGEN: Hepatitis B Surface Ag: NEGATIVE

## 2021-10-03 LAB — HEPATITIS A ANTIBODY, TOTAL: hep A Total Ab: POSITIVE — AB

## 2021-10-03 LAB — PROTIME-INR
INR: 1 (ref 0.9–1.2)
Prothrombin Time: 10.2 s (ref 9.1–12.0)

## 2021-10-03 LAB — HEPATITIS C GENOTYPE

## 2021-10-03 LAB — HEPATITIS B CORE ANTIBODY, TOTAL: Hep B Core Total Ab: NEGATIVE

## 2021-10-03 LAB — HIV ANTIBODY (ROUTINE TESTING W REFLEX): HIV Screen 4th Generation wRfx: NONREACTIVE

## 2021-10-07 ENCOUNTER — Other Ambulatory Visit: Payer: Self-pay

## 2021-10-08 ENCOUNTER — Other Ambulatory Visit: Payer: Self-pay

## 2021-10-08 ENCOUNTER — Ambulatory Visit: Payer: Medicaid Other | Admitting: Nurse Practitioner

## 2021-10-08 ENCOUNTER — Encounter: Payer: Self-pay | Admitting: Nurse Practitioner

## 2021-10-08 VITALS — BP 102/73 | HR 104 | Temp 98.2°F | Resp 20 | Ht 67.0 in | Wt 155.0 lb

## 2021-10-08 DIAGNOSIS — F320 Major depressive disorder, single episode, mild: Secondary | ICD-10-CM

## 2021-10-08 DIAGNOSIS — R768 Other specified abnormal immunological findings in serum: Secondary | ICD-10-CM

## 2021-10-08 DIAGNOSIS — R7989 Other specified abnormal findings of blood chemistry: Secondary | ICD-10-CM | POA: Diagnosis not present

## 2021-10-08 DIAGNOSIS — Z720 Tobacco use: Secondary | ICD-10-CM

## 2021-10-08 DIAGNOSIS — F411 Generalized anxiety disorder: Secondary | ICD-10-CM | POA: Diagnosis not present

## 2021-10-08 MED ORDER — MAVYRET 100-40 MG PO TABS: ORAL_TABLET | ORAL | 1 refills | Status: DC

## 2021-10-08 NOTE — Progress Notes (Signed)
? ?Subjective:  ? ? Patient ID: Amy Pham, female    DOB: 01/28/1991, 31 y.o.   MRN: [id number] ? ? ?Chief Complaint: medical management of chronic issues  ?  ? ?HPI: ? ?Amy KindredMeredith B Massimino is a 31 y.o. who identifies as a female who was assigned female at birth.  ? ?Social history: ?Lives with: her kids ?Work history: not working currently ? ? ?Comes in today for follow up of the following chronic medical issues: ? ?1. Depression, major, single episode, mild (HCC) ?Doing well on no meds ? ?  10/08/2021  ?  4:26 PM 07/01/2021  ? 10:42 AM 04/24/2021  ?  4:18 PM  ?Depression screen PHQ 2/9  ?Decreased Interest 0 0 1  ?Down, Depressed, Hopeless 0 0 1  ?PHQ - 2 Score 0 0 2  ?Altered sleeping 0 0 0  ?Tired, decreased energy 1 2 0  ?Change in appetite 1 0 0  ?Feeling bad or failure about yourself  0 0 1  ?Trouble concentrating 1 3 3   ?Moving slowly or fidgety/restless 0 0 0  ?Suicidal thoughts 0 0 0  ?PHQ-9 Score 3 5 6   ?Difficult doing work/chores Somewhat difficult Somewhat difficult Somewhat difficult  ? ? ? ?2. Generalized anxiety disorder ?She tries to stay busy to help with her anxiety ? ?  10/08/2021  ?  4:27 PM 07/01/2021  ? 10:43 AM 04/24/2021  ?  4:19 PM 04/11/2021  ?  3:48 PM  ?GAD 7 : Generalized Anxiety Score  ?Nervous, Anxious, on Edge 1 3 1 3   ?Control/stop worrying 0 0 0 2  ?Worry too much - different things 0 0 0 2  ?Trouble relaxing 1 3 1  0  ?Restless 0 0 0 0  ?Easily annoyed or irritable 1 1 0 0  ?Afraid - awful might happen 0 0 0 2  ?Total GAD 7 Score 3 7 2 9   ?Anxiety Difficulty Somewhat difficult Somewhat difficult Not difficult at all Somewhat difficult  ? ? ? ? ?3. Elevated LFTs ?Lab Results  ?Component Value Date  ? ALT 54 (H) 09/30/2021  ? AST 38 09/30/2021  ? ALKPHOS 100 09/30/2021  ? BILITOT 0.3 09/30/2021  ? ? ? ?4. Hepatitis C antibody positive in blood ?Positive for hepatitic C. Had appointment with Dr. Benard Rinkoark on 09/30/21. He repeated her labs and hep C was positive. She was started on  Mavyret.  ? ?5. Tobacco abuse ?Smokes over a pack every 2-3 days. Vapes to cut down on cigarettes. ? ? ?New complaints: ?None today ? ?Allergies  ?Allergen Reactions  ? Cefzil [Cefprozil] Rash  ? ?Outpatient Encounter Medications as of 10/08/2021  ?Medication Sig  ? Glecaprevir-Pibrentasvir (MAVYRET) 100-40 MG TABS 3 tablets by mouth daily at 6 am  ? ?No facility-administered encounter medications on file as of 10/08/2021.  ? ? ?Past Surgical History:  ?Procedure Laterality Date  ? CESAREAN SECTION  05/31/2011  ? Procedure: CESAREAN SECTION;  Surgeon: Michael LitterNaima A Dillard, MD;  Location: [facility];  Service: Gynecology;  Laterality: N/A;  Primary cesarean section with delivery of baby boy at [id number]. apgars 8/9.  ? CESAREAN SECTION N/A 07/19/2013  ? Procedure: CESAREAN SECTION;  Surgeon: Adam PhenixJames G Arnold, MD;  Location: [facility];  Service: Obstetrics;  Laterality: N/A;  ? LAPAROSCOPIC TUBAL LIGATION Bilateral 02/21/2014  ? Procedure: LAPAROSCOPIC TUBAL LIGATION;  Surgeon: Adam PhenixJames G Arnold, MD;  Location: [facility];  Service: Gynecology;  Laterality: Bilateral;  ? TONSILLECTOMY    ? 2007  ? TOOTH  EXTRACTION N/A 07/25/2021  ? Procedure: DENTAL RESTORATION/EXTRACTIONS;  Surgeon: Ocie Doyne, DMD;  Location: [facility];  Service: Oral Surgery;  Laterality: N/A;  ? ? ?Family History  ?Problem Relation Age of Onset  ? Depression Mother   ? Mental illness Father   ? Diabetes Maternal Uncle   ? Arthritis Maternal Grandmother   ? Cancer Maternal Grandmother   ? Heart disease Paternal Grandfather   ? Liver disease Paternal Uncle   ?     unknown etiology  ? Colon cancer Neg Hx   ? ? ? ? ?Controlled substance contract: n/a ? ? ? ? ?Review of Systems  ?Constitutional:  Negative for diaphoresis.  ?Eyes:  Negative for pain.  ?Respiratory:  Negative for shortness of breath.   ?Cardiovascular:  Negative for chest pain, palpitations and leg swelling.  ?Gastrointestinal:  Negative for abdominal pain.  ?Endocrine: Negative for polydipsia.  ?Skin:  Negative for rash.   ?Neurological:  Negative for dizziness, weakness and headaches.  ?Hematological:  Does not bruise/bleed easily.  ?All other systems reviewed and are negative. ? ?   ?Objective:  ? Physical Exam ?Vitals and nursing note reviewed.  ?Constitutional:   ?   General: She is not in acute distress. ?   Appearance: Normal appearance. She is well-developed.  ?HENT:  ?   Head: Normocephalic.  ?   Right Ear: Tympanic membrane normal.  ?   Left Ear: Tympanic membrane normal.  ?   Nose: Nose normal.  ?   Mouth/Throat:  ?   Mouth: Mucous membranes are moist.  ?Eyes:  ?   Pupils: Pupils are equal, round, and reactive to light.  ?Neck:  ?   Vascular: No carotid bruit or JVD.  ?Cardiovascular:  ?   Rate and Rhythm: Normal rate and regular rhythm.  ?   Heart sounds: Normal heart sounds.  ?Pulmonary:  ?   Effort: Pulmonary effort is normal. No respiratory distress.  ?   Breath sounds: Normal breath sounds. No wheezing or rales.  ?Chest:  ?   Chest wall: No tenderness.  ?Abdominal:  ?   General: Bowel sounds are normal. There is no distension or abdominal bruit.  ?   Palpations: Abdomen is soft. There is no hepatomegaly, splenomegaly, mass or pulsatile mass.  ?   Tenderness: There is no abdominal tenderness.  ?Musculoskeletal:     ?   General: Normal range of motion.  ?   Cervical back: Normal range of motion and neck supple.  ?Lymphadenopathy:  ?   Cervical: No cervical adenopathy.  ?Skin: ?   General: Skin is warm and dry.  ?Neurological:  ?   Mental Status: She is alert and oriented to person, place, and time.  ?   Deep Tendon Reflexes: Reflexes are normal and symmetric.  ?Psychiatric:     ?   Behavior: Behavior normal.     ?   Thought Content: Thought content normal.     ?   Judgment: Judgment normal.  ? ?BP 102/73   Pulse (!) 104   Temp 98.2 ?F (36.8 ?C) (Temporal)   Resp 20   Ht 5\' 7"  (1.702 m)   Wt 155 lb (70.3 kg)   LMP 09/23/2021 (Approximate)   SpO2 97%   BMI 24.28 kg/m?  ? ? ? ? ?   ?Assessment & Plan:  ? ?11/23/2021 in today with chief complaint of Medical Management of Chronic Issues ? ? ?1. Depression, major, single episode, mild (HCC) ?Stress management ? ?  2. Generalized anxiety disorder ?Again stress management ? ?3. Elevated LFTs ?Lab results reviewed ? ?4. Hepatitis C antibody positive in blood ?Keep follow up with DR. Roark ? ?5. Tobacco abuse ?Smoking cessation encouraged ? ? ? ?The above assessment and management plan was discussed with the patient. The patient verbalized understanding of and has agreed to the management plan. Patient is aware to call the clinic if symptoms persist or worsen. Patient is aware when to return to the clinic for a follow-up visit. Patient educated on when it is appropriate to go to the emergency department.  ? ?Mary-Margaret Daphine Deutscher, FNP ? ? ?

## 2021-10-08 NOTE — Patient Instructions (Signed)

## 2021-10-10 ENCOUNTER — Telehealth: Payer: Self-pay

## 2021-10-10 NOTE — Telephone Encounter (Signed)
Spoke with [employer] regarding delivery of patient's [place]. Medication will be delivered on Monday 10/13/2021. Lmom for pt to return my call.  ?

## 2021-10-13 NOTE — Telephone Encounter (Signed)
Pt was made aware and stated that she would be in on Tuesday to pick up the medication.  ?

## 2021-10-14 NOTE — Telephone Encounter (Signed)
Pt has picked up medication and has signed required documents. Stated that she will begin treatment first thing in the morning (10/15/2021). ?

## 2021-10-15 ENCOUNTER — Telehealth: Payer: Self-pay

## 2021-10-15 ENCOUNTER — Other Ambulatory Visit: Payer: Self-pay

## 2021-10-15 DIAGNOSIS — R768 Other specified abnormal immunological findings in serum: Secondary | ICD-10-CM

## 2021-10-15 NOTE — Telephone Encounter (Signed)
Labs ordered and will have patient complete on or around May 24th.  ?

## 2021-10-15 NOTE — Telephone Encounter (Signed)
What labs do you want the patient to complete after 4 weeks of treatment? ?

## 2021-10-28 ENCOUNTER — Other Ambulatory Visit: Payer: Self-pay

## 2021-10-28 DIAGNOSIS — R768 Other specified abnormal immunological findings in serum: Secondary | ICD-10-CM

## 2021-10-29 ENCOUNTER — Telehealth: Payer: Self-pay

## 2021-10-29 NOTE — Telephone Encounter (Signed)
Pt was made aware and verbalized understanding.  

## 2021-10-29 NOTE — Telephone Encounter (Signed)
Pt called stating that she is having a tooth extracted and the dentist prescribed her amoxicillin and ibuprofen. Pt is wanting to know if they will interfere with her hep c medication. Please advise.  ?

## 2021-10-31 ENCOUNTER — Ambulatory Visit: Payer: Self-pay | Admitting: Gastroenterology

## 2021-11-12 ENCOUNTER — Other Ambulatory Visit: Payer: Medicaid Other

## 2021-11-13 LAB — HEPATIC FUNCTION PANEL
ALT: 14 IU/L (ref 0–32)
AST: 20 IU/L (ref 0–40)
Albumin: 4.3 g/dL (ref 3.9–5.0)
Alkaline Phosphatase: 60 IU/L (ref 44–121)
Bilirubin Total: 0.4 mg/dL (ref 0.0–1.2)
Bilirubin, Direct: 0.11 mg/dL (ref 0.00–0.40)
Total Protein: 6.5 g/dL (ref 6.0–8.5)

## 2021-11-14 LAB — HCV RNA QUANT: Hepatitis C Quantitation: <15 IU/mL

## 2021-11-18 ENCOUNTER — Other Ambulatory Visit: Payer: Self-pay

## 2021-11-18 DIAGNOSIS — R768 Other specified abnormal immunological findings in serum: Secondary | ICD-10-CM

## 2021-11-24 ENCOUNTER — Other Ambulatory Visit: Payer: Self-pay

## 2022-02-06 ENCOUNTER — Encounter: Payer: Self-pay | Admitting: Family Medicine

## 2022-02-06 ENCOUNTER — Ambulatory Visit: Payer: Medicaid Other | Admitting: Family Medicine

## 2022-02-06 ENCOUNTER — Ambulatory Visit (INDEPENDENT_AMBULATORY_CARE_PROVIDER_SITE_OTHER): Payer: Medicaid Other

## 2022-02-06 VITALS — BP 108/72 | HR 81 | Temp 98.3°F | Ht 67.0 in | Wt 164.0 lb

## 2022-02-06 DIAGNOSIS — R0789 Other chest pain: Secondary | ICD-10-CM | POA: Diagnosis not present

## 2022-02-06 DIAGNOSIS — M940 Chondrocostal junction syndrome [Tietze]: Secondary | ICD-10-CM | POA: Diagnosis not present

## 2022-02-06 DIAGNOSIS — L03115 Cellulitis of right lower limb: Secondary | ICD-10-CM

## 2022-02-06 MED ORDER — PREDNISONE 20 MG PO TABS: ORAL_TABLET | ORAL | 0 refills | Status: DC

## 2022-02-06 MED ORDER — DOXYCYCLINE HYCLATE 100 MG PO TABS: 100.0000 mg | ORAL_TABLET | Freq: Two times a day (BID) | ORAL | 0 refills | Status: AC
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
## 2022-03-24 ENCOUNTER — Other Ambulatory Visit: Payer: Medicaid Other

## 2022-03-24 ENCOUNTER — Other Ambulatory Visit: Payer: Self-pay

## 2022-03-24 DIAGNOSIS — R768 Other specified abnormal immunological findings in serum: Secondary | ICD-10-CM

## 2022-03-26 LAB — HCV RNA QUANT: Hepatitis C Quantitation: NOT DETECTED IU/mL

## 2022-04-06 ENCOUNTER — Encounter: Payer: Self-pay | Admitting: Family Medicine

## 2022-04-06 ENCOUNTER — Ambulatory Visit: Payer: Medicaid Other | Admitting: Family Medicine

## 2022-04-06 DIAGNOSIS — N3000 Acute cystitis without hematuria: Secondary | ICD-10-CM

## 2022-04-06 DIAGNOSIS — R300 Dysuria: Secondary | ICD-10-CM | POA: Diagnosis not present

## 2022-04-06 LAB — MICROSCOPIC EXAMINATION: Renal Epithel, UA: NONE SEEN /hpf

## 2022-04-06 LAB — URINALYSIS, ROUTINE W REFLEX MICROSCOPIC
Bilirubin, UA: NEGATIVE
Glucose, UA: NEGATIVE
Ketones, UA: NEGATIVE
Leukocytes,UA: NEGATIVE
Nitrite, UA: POSITIVE — AB
Specific Gravity, UA: 1.02 (ref 1.005–1.030)
Urobilinogen, Ur: 0.2 mg/dL (ref 0.2–1.0)
pH, UA: 6 (ref 5.0–7.5)

## 2022-04-06 MED ORDER — FLUCONAZOLE 150 MG PO TABS: 150.0000 mg | ORAL_TABLET | Freq: Once | ORAL | 0 refills | Status: AC

## 2022-04-06 MED ORDER — PHENAZOPYRIDINE HCL 200 MG PO TABS
200.0000 mg | ORAL_TABLET | Freq: Three times a day (TID) | ORAL | 0 refills | Status: AC | PRN
Start: 2022-04-06 — End: 2022-04-08

## 2022-04-06 MED ORDER — NITROFURANTOIN MONOHYD MACRO 100 MG PO CAPS: 100.0000 mg | ORAL_CAPSULE | Freq: Two times a day (BID) | ORAL | 0 refills | Status: DC
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
## 2022-04-09 ENCOUNTER — Encounter: Payer: Self-pay | Admitting: Nurse Practitioner

## 2022-04-09 ENCOUNTER — Ambulatory Visit (INDEPENDENT_AMBULATORY_CARE_PROVIDER_SITE_OTHER): Payer: Medicaid Other | Admitting: Nurse Practitioner

## 2022-04-09 VITALS — BP 108/71 | HR 93 | Temp 97.8°F | Resp 20 | Ht 67.0 in | Wt 167.0 lb

## 2022-04-09 DIAGNOSIS — F909 Attention-deficit hyperactivity disorder, unspecified type: Secondary | ICD-10-CM | POA: Diagnosis not present

## 2022-04-09 MED ORDER — ATOMOXETINE HCL 40 MG PO CAPS: 40.0000 mg | ORAL_CAPSULE | Freq: Every day | ORAL | 2 refills | Status: DC

## 2022-04-09 NOTE — Progress Notes (Signed)
   Subjective:    Patient ID: Amy Pham, female    DOB: 07/03/1990, 31 y.o.   MRN: [id number]   Chief Complaint: brain fog  HPI  Patient says that she cannot keep conversations going with other s because she starts thinking about something and then she cant focus. She will say things that makes others mad. She cannot complete any tasks at all. Trouble focusing.   See ASRS questionaire  Review of Systems  Constitutional:  Negative for diaphoresis.  Eyes:  Negative for pain.  Respiratory:  Negative for shortness of breath.   Cardiovascular:  Negative for chest pain, palpitations and leg swelling.  Gastrointestinal:  Negative for abdominal pain.  Endocrine: Negative for polydipsia.  Skin:  Negative for rash.  Neurological:  Negative for dizziness, weakness and headaches.  Hematological:  Does not bruise/bleed easily.  All other systems reviewed and are negative.      Objective:   Physical Exam Vitals reviewed.  Constitutional:      Appearance: Normal appearance.  Cardiovascular:     Rate and Rhythm: Normal rate and regular rhythm.     Heart sounds: Normal heart sounds.  Pulmonary:     Effort: Pulmonary effort is normal.     Breath sounds: Normal breath sounds.  Skin:    General: Skin is warm.  Neurological:     General: No focal deficit present.     Mental Status: She is alert and oriented to person, place, and time.  Psychiatric:        Mood and Affect: Mood normal.        Behavior: Behavior normal.    BP 108/71   Pulse 93   Temp 97.8 F (36.6 C) (Temporal)   Resp 20   Ht 5\' 7"  (1.702 m)   Wt 167 lb (75.8 kg)   SpO2 100%   BMI 26.16 kg/m         Assessment & Plan:  Amy Pham in today with chief complaint of brain fog (Thinks she needs therapy)   1. Adult ADHD Stress management Needs counseling - atomoxetine (STRATTERA) 40 MG capsule; Take 1 capsule (40 mg total) by mouth daily.  Dispense: 30 capsule; Refill: 2    The above  assessment and management plan was discussed with the patient. The patient verbalized understanding of and has agreed to the management plan. Patient is aware to call the clinic if symptoms persist or worsen. Patient is aware when to return to the clinic for a follow-up visit. Patient educated on when it is appropriate to go to the emergency department.   Mary-Margaret Hassell Done, FNP

## 2022-04-09 NOTE — Patient Instructions (Signed)

## 2022-04-14 ENCOUNTER — Ambulatory Visit: Payer: Medicaid Other | Admitting: Nurse Practitioner

## 2022-04-14 ENCOUNTER — Encounter: Payer: Self-pay | Admitting: Nurse Practitioner

## 2022-04-14 VITALS — BP 98/65 | HR 95 | Temp 98.8°F | Ht 67.0 in | Wt 168.0 lb

## 2022-04-14 DIAGNOSIS — R300 Dysuria: Secondary | ICD-10-CM

## 2022-04-14 DIAGNOSIS — L03012 Cellulitis of left finger: Secondary | ICD-10-CM

## 2022-04-14 LAB — URINALYSIS, ROUTINE W REFLEX MICROSCOPIC
Bilirubin, UA: NEGATIVE
Glucose, UA: NEGATIVE
Ketones, UA: NEGATIVE
Leukocytes,UA: NEGATIVE
Nitrite, UA: NEGATIVE
Protein,UA: NEGATIVE
RBC, UA: NEGATIVE
Specific Gravity, UA: 1.025 (ref 1.005–1.030)
Urobilinogen, Ur: 0.2 mg/dL (ref 0.2–1.0)
pH, UA: 6.5 (ref 5.0–7.5)

## 2022-04-14 MED ORDER — BACITRACIN 500 UNIT/GM EX OINT: 1.0000 | TOPICAL_OINTMENT | Freq: Two times a day (BID) | CUTANEOUS | 0 refills | Status: DC

## 2022-04-14 NOTE — Progress Notes (Signed)
Acute Office Visit  Subjective:     Patient ID: Amy Pham, female    DOB: Aug 14, 1990, 31 y.o.   MRN: [id number]  Chief Complaint  Patient presents with   Dysuria    Treated for UTI last week, symptoms linger    Dysuria  This is a recurrent problem. The current episode started in the past 7 days. The problem has been gradually improving. The patient is experiencing no pain. There has been no fever. Pertinent negatives include no chills, discharge, flank pain, hematuria, hesitancy, nausea or urgency. She has tried antibiotics for the symptoms. The treatment provided significant relief.    Review of Systems  Constitutional: Negative.  Negative for chills.  HENT: Negative.    Gastrointestinal:  Negative for nausea.  Genitourinary:  Positive for dysuria. Negative for flank pain, hematuria, hesitancy and urgency.  Skin: Negative.  Negative for itching and rash.       Paronychia left finger   All other systems reviewed and are negative.       Objective:    BP 98/65   Pulse 95   Temp 98.8 F (37.1 C)   Ht 5\' 7"  (1.702 m)   Wt 168 lb (76.2 kg)   LMP 03/24/2022 (Approximate)   SpO2 100%   BMI 26.31 kg/m    Physical Exam Vitals and nursing note reviewed.  Constitutional:      Appearance: Normal appearance.  HENT:     Head: Normocephalic.     Right Ear: External ear normal.     Left Ear: External ear normal.     Nose: Nose normal.  Eyes:     Conjunctiva/sclera: Conjunctivae normal.  Cardiovascular:     Rate and Rhythm: Normal rate and regular rhythm.     Pulses: Normal pulses.     Heart sounds: Normal heart sounds.  Pulmonary:     Effort: Pulmonary effort is normal.     Breath sounds: Normal breath sounds.  Abdominal:     General: Bowel sounds are normal.     Tenderness: There is no abdominal tenderness. There is no right CVA tenderness or left CVA tenderness.  Skin:    General: Skin is warm.     Findings: Abscess present.          Comments:  Paronychia left finger, pain and swelling   Neurological:     General: No focal deficit present.     Mental Status: She is alert and oriented to person, place, and time.     No results found for any visits on 04/14/22.      Assessment & Plan:  Patient presents with dysuria, treated a week ago for UTI, patent is concerned that she [place] still having symptoms and UTIs not completely cleared.  Patient took diflucan 150 mg by mouth this morning to treat antibiotic-induced vaginal yeast. Completed urinalysis results negative for UTI.  Considering UTI  vs  interstitial cystitis -pyridium for pain Precaution and education provided -All questions answered -follow up with worsening unresolved symptoms   Concerning patient's paronychia on left finger, advised patient to soak finger in warm water, clean with alcohol and apply triple antibiotic cream.  Bacitracin sent to pharmacy.  Patient knows to follow-up with unresolved symptoms.   Problem List Items Addressed This Visit   None Visit Diagnoses     Dysuria    -  Primary   Relevant Orders   Urinalysis, Routine w reflex microscopic   Urine Culture   Paronychia of finger, left  Relevant Medications   bacitracin 500 UNIT/GM ointment       Meds ordered this encounter  Medications   bacitracin 500 UNIT/GM ointment    Sig: Apply 1 Application topically 2 (two) times daily.    Dispense:  15 g    Refill:  0    Order Specific Question:   Supervising Provider    Answer:   Mechele Claude [phone]    No follow-ups on file.  Daryll Drown, NP

## 2022-04-16 LAB — URINE CULTURE

## 2022-05-13 ENCOUNTER — Ambulatory Visit: Payer: Medicaid Other | Admitting: Nurse Practitioner

## 2022-05-13 ENCOUNTER — Encounter: Payer: Self-pay | Admitting: Nurse Practitioner

## 2022-05-13 VITALS — BP 114/74 | HR 72 | Temp 98.6°F | Ht 67.0 in | Wt 179.0 lb

## 2022-05-13 DIAGNOSIS — Z202 Contact with and (suspected) exposure to infections with a predominantly sexual mode of transmission: Secondary | ICD-10-CM | POA: Diagnosis not present

## 2022-05-13 DIAGNOSIS — R300 Dysuria: Secondary | ICD-10-CM

## 2022-05-13 LAB — URINALYSIS
Bilirubin, UA: NEGATIVE
Glucose, UA: NEGATIVE
Ketones, UA: NEGATIVE
Leukocytes,UA: NEGATIVE
Nitrite, UA: NEGATIVE
Protein,UA: NEGATIVE
RBC, UA: NEGATIVE
Specific Gravity, UA: 1.025 (ref 1.005–1.030)
Urobilinogen, Ur: 0.2 mg/dL (ref 0.2–1.0)
pH, UA: 7 (ref 5.0–7.5)

## 2022-05-13 MED ORDER — NITROFURANTOIN MONOHYD MACRO 100 MG PO CAPS: 100.0000 mg | ORAL_CAPSULE | Freq: Two times a day (BID) | ORAL | 0 refills | Status: DC

## 2022-05-13 NOTE — Progress Notes (Signed)
Acute Office Visit  Subjective:     Patient ID: Amy Pham, female    DOB: 07-09-1990, 31 y.o.   MRN: [id number]  Chief Complaint  Patient presents with   Dysuria    2-3 days    Dysuria  This is a new problem. The problem has been gradually worsening. The quality of the pain is described as burning (razor sharp). The pain is at a severity of 6/10. The pain is moderate. There has been no fever. She is Sexually active. There is No history of pyelonephritis. Pertinent negatives include no chills, discharge or flank pain. Treatments tried: AZO OTC. The treatment provided no relief.  Exposure to STD  The patient's primary symptoms include dysuria. The patient's pertinent negatives include no discharge. Associate symptoms include abdominal pain. Pertinent negatives include no fever, genital odor or rectal pain. She has tried nothing for the symptoms. Risk factors include history of STDs.     Review of Systems  Constitutional:  Negative for chills and fever.  HENT: Negative.    Respiratory: Negative.    Cardiovascular: Negative.   Gastrointestinal:  Positive for abdominal pain. Negative for rectal pain.  Genitourinary:  Positive for dysuria. Negative for flank pain.  Skin: Negative.  Negative for rash.  Neurological: Negative.   All other systems reviewed and are negative.       Objective:    BP 114/74   Pulse 72   Temp 98.6 F (37 C)   Ht 5\' 7"  (1.702 m)   Wt 179 lb (81.2 kg)   LMP 04/24/2022 (Approximate)   SpO2 98%   BMI 28.04 kg/m  BP Readings from Last 3 Encounters:  05/13/22 114/74  04/14/22 98/65  04/09/22 108/71   Wt Readings from Last 3 Encounters:  05/13/22 179 lb (81.2 kg)  04/14/22 168 lb (76.2 kg)  04/09/22 167 lb (75.8 kg)      Physical Exam Vitals reviewed.  HENT:     Head: Normocephalic.     Right Ear: External ear normal.     Left Ear: External ear normal.     Nose: Nose normal.  Eyes:     Pupils: Pupils are equal, round, and  reactive to light.  Cardiovascular:     Rate and Rhythm: Normal rate and regular rhythm.     Pulses: Normal pulses.     Heart sounds: Normal heart sounds.  Pulmonary:     Effort: Pulmonary effort is normal.     Breath sounds: Normal breath sounds.  Abdominal:     General: Bowel sounds are normal.     Tenderness: There is abdominal tenderness.  Skin:    General: Skin is warm.     Findings: No erythema or rash.  Neurological:     General: No focal deficit present.     Mental Status: She is alert and oriented to person, place, and time.     No results found for any visits on 05/13/22.      Assessment & Plan:  Patient presents with razor sharp pain with every urination and abdominal pain in the past 2-3 days.  UTI  vs  interstitial cystitis -urinalysis completed results pending -pyridium for pain -Macrobid 100 mg tablet by mouth  Precaution and education provided -All questions answered -follow up with unresolved symptoms    Problem List Items Addressed This Visit   None Visit Diagnoses     Dysuria    -  Primary   Relevant Medications   nitrofurantoin, macrocrystal-monohydrate, (MACROBID)  100 MG capsule   Other Relevant Orders   Urine Culture   Urinalysis   Ct, Ng, Mycoplasmas NAA, Urine   STD exposure       Relevant Orders   Ct, Ng, Mycoplasmas NAA, Urine       Meds ordered this encounter  Medications   nitrofurantoin, macrocrystal-monohydrate, (MACROBID) 100 MG capsule    Sig: Take 1 capsule (100 mg total) by mouth 2 (two) times daily. 1 po BId    Dispense:  14 capsule    Refill:  0    Order Specific Question:   Supervising Provider    Answer:   Claretta Fraise [id number]    Return if symptoms worsen or fail to improve.  Ivy Lynn, NP

## 2022-05-13 NOTE — Patient Instructions (Signed)

## 2022-05-16 LAB — URINE CULTURE: Organism ID, Bacteria: NO GROWTH

## 2022-05-16 LAB — CT, NG, MYCOPLASMAS NAA, URINE
Chlamydia trachomatis, NAA: NEGATIVE
Mycoplasma genitalium NAA: NEGATIVE
Mycoplasma hominis NAA: NEGATIVE
Neisseria gonorrhoeae, NAA: NEGATIVE
Ureaplasma spp NAA: POSITIVE — AB

## 2022-05-17 ENCOUNTER — Other Ambulatory Visit: Payer: Self-pay | Admitting: Nurse Practitioner

## 2022-05-17 DIAGNOSIS — R8290 Unspecified abnormal findings in urine: Secondary | ICD-10-CM

## 2022-05-17 MED ORDER — AZITHROMYCIN 250 MG PO TABS: ORAL_TABLET | ORAL | 0 refills | Status: AC

## 2022-05-19 ENCOUNTER — Ambulatory Visit: Payer: Medicaid Other | Admitting: Nurse Practitioner

## 2022-05-19 ENCOUNTER — Encounter: Payer: Self-pay | Admitting: Nurse Practitioner

## 2022-05-28 ENCOUNTER — Encounter: Payer: Self-pay | Admitting: Nurse Practitioner

## 2022-05-28 ENCOUNTER — Other Ambulatory Visit: Payer: Self-pay | Admitting: Nurse Practitioner

## 2022-05-28 DIAGNOSIS — F411 Generalized anxiety disorder: Secondary | ICD-10-CM | POA: Diagnosis not present

## 2022-05-28 DIAGNOSIS — F331 Major depressive disorder, recurrent, moderate: Secondary | ICD-10-CM | POA: Diagnosis not present

## 2022-05-28 DIAGNOSIS — F901 Attention-deficit hyperactivity disorder, predominantly hyperactive type: Secondary | ICD-10-CM | POA: Diagnosis not present

## 2022-05-28 NOTE — Progress Notes (Signed)
Patient returning call for labs. °

## 2022-05-29 ENCOUNTER — Encounter: Payer: Self-pay | Admitting: *Deleted

## 2022-06-03 ENCOUNTER — Telehealth: Payer: Self-pay | Admitting: Nurse Practitioner

## 2022-06-03 NOTE — Telephone Encounter (Signed)
Left message to call back  

## 2022-06-04 ENCOUNTER — Encounter: Payer: Self-pay | Admitting: Family Medicine

## 2022-06-04 ENCOUNTER — Ambulatory Visit (INDEPENDENT_AMBULATORY_CARE_PROVIDER_SITE_OTHER): Payer: Medicaid Other | Admitting: Family Medicine

## 2022-06-04 DIAGNOSIS — J40 Bronchitis, not specified as acute or chronic: Secondary | ICD-10-CM | POA: Diagnosis not present

## 2022-06-04 DIAGNOSIS — J988 Other specified respiratory disorders: Secondary | ICD-10-CM | POA: Diagnosis not present

## 2022-06-04 MED ORDER — DOXYCYCLINE HYCLATE 100 MG PO TABS: 100.0000 mg | ORAL_TABLET | Freq: Two times a day (BID) | ORAL | 0 refills | Status: AC

## 2022-06-04 MED ORDER — ALBUTEROL SULFATE HFA 108 (90 BASE) MCG/ACT IN AERS: 2.0000 | INHALATION_SPRAY | Freq: Four times a day (QID) | RESPIRATORY_TRACT | 2 refills | Status: DC | PRN

## 2022-06-04 MED ORDER — PREDNISONE 10 MG (21) PO TBPK: ORAL_TABLET | ORAL | 0 refills | Status: DC

## 2022-06-04 NOTE — Progress Notes (Signed)
Virtual Visit  Note Due to COVID-19 pandemic this visit was conducted virtually. This visit type was conducted due to national recommendations for restrictions regarding the COVID-19 Pandemic (e.g. social distancing, sheltering in place) in an effort to limit this patient's exposure and mitigate transmission in our community. All issues noted in this document were discussed and addressed.  A physical exam was not performed with this format.  I connected with Amy Pham on 06/04/22 at 1105 by telephone and verified that I am speaking with the correct person using two identifiers. Amy Pham is currently located at home and no one is currently with her during the visit. The provider, Gabriel Earing, FNP is located in their office at time of visit.  I discussed the limitations, risks, security and privacy concerns of performing an evaluation and management service by telephone and the availability of in person appointments. I also discussed with the patient that there may be a patient responsible charge related to this service. The patient expressed understanding and agreed to proceed.  CC: cough  History and Present Illness:  Cough This is a new problem. Episode onset: 1 week. The problem has been gradually worsening. The cough is Productive of sputum (yellow, green). Associated symptoms include nasal congestion, postnasal drip, rhinorrhea and shortness of breath (with activity or coughing). Pertinent negatives include no chest pain, chills, fever or wheezing. The symptoms are aggravated by exercise and lying down. Risk factors for lung disease include smoking/tobacco exposure. She has tried OTC cough suppressant (cough drops) for the symptoms. The treatment provided mild relief. Her past medical history is significant for asthma and bronchitis. There is no history of COPD or pneumonia.    Review of Systems  Constitutional:  Negative for chills and fever.  HENT:  Positive for  postnasal drip and rhinorrhea.   Respiratory:  Positive for cough and shortness of breath (with activity or coughing). Negative for wheezing.   Cardiovascular:  Negative for chest pain.     Observations/Objective: Alert and oriented x 3. Able to speak in full sentences without difficulty.   Assessment and Plan: [name] was seen today for cough.  Diagnoses and all orders for this visit:  Respiratory infection Doxycyline as below.  -     doxycycline (VIBRA-TABS) 100 MG tablet; Take 1 tablet (100 mg total) by mouth 2 (two) times daily for 7 days. 1 po bid  Bronchitis Prednisone as below. Albuterol prn.  -     predniSONE (STERAPRED UNI-PAK 21 TAB) 10 MG (21) TBPK tablet; Use as directed on back of pill pack -     albuterol (VENTOLIN HFA) 108 (90 Base) MCG/ACT inhaler; Inhale 2 puffs into the lungs every 6 (six) hours as needed for wheezing or shortness of breath.  Discussed symptomatic care and return precautions.   Follow Up Instructions: Return to office for new or worsening symptoms, or if symptoms persist.      I discussed the assessment and treatment plan with the patient. The patient was provided an opportunity to ask questions and all were answered. The patient agreed with the plan and demonstrated an understanding of the instructions.   The patient was advised to call back or seek an in-person evaluation if the symptoms worsen or if the condition fails to improve as anticipated.  The above assessment and management plan was discussed with the patient. The patient verbalized understanding of and has agreed to the management plan. Patient is aware to call the clinic if symptoms persist  or worsen. Patient is aware when to return to the clinic for a follow-up visit. Patient educated on when it is appropriate to go to the emergency department.   Time call ended:  1119  I provided 14 minutes of  non face-to-face time during this encounter.    Gabriel Earing, FNP

## 2022-06-08 ENCOUNTER — Telehealth: Payer: Self-pay | Admitting: Nurse Practitioner

## 2022-06-08 MED ORDER — PROMETHAZINE-DM 6.25-15 MG/5ML PO SYRP: 5.0000 mL | ORAL_SOLUTION | Freq: Four times a day (QID) | ORAL | 0 refills | Status: DC | PRN

## 2022-06-08 NOTE — Telephone Encounter (Signed)
Pt r/c.

## 2022-06-08 NOTE — Telephone Encounter (Signed)
Promethazine DM sent to pharmacy

## 2022-06-08 NOTE — Telephone Encounter (Signed)
Pt aware.

## 2022-06-10 NOTE — Telephone Encounter (Signed)
Left message to call back  

## 2022-06-11 ENCOUNTER — Ambulatory Visit: Payer: Medicaid Other | Admitting: Family Medicine

## 2022-06-11 ENCOUNTER — Encounter: Payer: Self-pay | Admitting: Family Medicine

## 2022-06-11 VITALS — BP 119/76 | HR 100 | Temp 98.2°F | Ht 67.0 in | Wt 170.4 lb

## 2022-06-11 DIAGNOSIS — N390 Urinary tract infection, site not specified: Secondary | ICD-10-CM | POA: Diagnosis not present

## 2022-06-11 DIAGNOSIS — J40 Bronchitis, not specified as acute or chronic: Secondary | ICD-10-CM

## 2022-06-11 LAB — URINALYSIS, ROUTINE W REFLEX MICROSCOPIC
Bilirubin, UA: NEGATIVE
Glucose, UA: NEGATIVE
Ketones, UA: NEGATIVE
Leukocytes,UA: NEGATIVE
Nitrite, UA: NEGATIVE
Protein,UA: NEGATIVE
RBC, UA: NEGATIVE
Specific Gravity, UA: >1.03 — ABNORMAL HIGH (ref 1.005–1.030)
Urobilinogen, Ur: 0.2 mg/dL (ref 0.2–1.0)
pH, UA: 6 (ref 5.0–7.5)
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
## 2022-06-16 NOTE — Telephone Encounter (Signed)
Still not able to reach patient and patient never returned call, encounter closed.

## 2022-06-23 ENCOUNTER — Telehealth: Payer: Self-pay | Admitting: Nurse Practitioner

## 2022-06-23 NOTE — Telephone Encounter (Signed)
Appointment scheduled for tomorrow at 10:00 with Chevis Pretty, FNP

## 2022-06-24 ENCOUNTER — Ambulatory Visit: Payer: Medicaid Other | Admitting: Nurse Practitioner

## 2022-06-24 ENCOUNTER — Encounter: Payer: Self-pay | Admitting: Nurse Practitioner

## 2022-06-24 VITALS — BP 117/78 | HR 94 | Temp 98.5°F | Ht 66.0 in | Wt 174.6 lb

## 2022-06-24 DIAGNOSIS — K219 Gastro-esophageal reflux disease without esophagitis: Secondary | ICD-10-CM | POA: Diagnosis not present

## 2022-06-24 DIAGNOSIS — J40 Bronchitis, not specified as acute or chronic: Secondary | ICD-10-CM | POA: Diagnosis not present

## 2022-06-24 MED ORDER — CHLORPHEN-PE-ACETAMINOPHEN 4-10-325 MG PO TABS: 1.0000 | ORAL_TABLET | Freq: Four times a day (QID) | ORAL | 0 refills | Status: DC | PRN

## 2022-06-24 MED ORDER — OMEPRAZOLE 20 MG PO CPDR: 20.0000 mg | DELAYED_RELEASE_CAPSULE | Freq: Every day | ORAL | 3 refills | Status: DC

## 2022-06-24 MED ORDER — PREDNISONE 20 MG PO TABS: ORAL_TABLET | ORAL | 0 refills | Status: DC

## 2022-06-24 NOTE — Progress Notes (Signed)
Subjective:    Patient ID: Amy Pham, female    DOB: [birth year], 32 y.o.   MRN: [id number]  Chief Complaint: coughing up blood (This has been on going for over a month )   HPI Patient comes in  today c/o coughing up blood. Patient says her cough started over a month ago. Has slight sore throat. Says he has pressure in her head. She has been seen 4x for this. She has had doxycycline and prednisone. Cough is still bad . Worse at night and early in morning. Does ok during the day time. Had some blood tinged sputum yesterday.     Review of Systems  Constitutional:  Negative for diaphoresis.  Eyes:  Negative for pain.  Respiratory:  Negative for shortness of breath.   Cardiovascular:  Negative for chest pain, palpitations and leg swelling.  Gastrointestinal:  Negative for abdominal pain.  Endocrine: Negative for polydipsia.  Skin:  Negative for rash.  Neurological:  Negative for dizziness, weakness and headaches.  Hematological:  Does not bruise/bleed easily.  All other systems reviewed and are negative.      Objective:   Physical Exam Constitutional:      Appearance: Normal appearance.  Cardiovascular:     Rate and Rhythm: Normal rate.     Heart sounds: Normal heart sounds.  Pulmonary:     Breath sounds: Wheezing (exp wheezes right lower lobe) present.  Skin:    General: Skin is warm.  Neurological:     General: No focal deficit present.     Mental Status: She is alert and oriented to person, place, and time.  Psychiatric:        Mood and Affect: Mood normal.        Behavior: Behavior normal.     BP 117/78   Pulse 94   Temp 98.5 F (36.9 C) (Temporal)   Ht 5\' 6"  (1.676 m)   Wt 174 lb 9.6 oz (79.2 kg)   SpO2 100%   BMI 28.18 kg/m        Assessment & Plan:   Amy Pham in today with chief complaint of coughing up blood (This has been on going for over a month )   1. Bronchitis 1. Take meds as prescribed 2. Use a cool mist humidifier  especially during the winter months and when heat has been humid. 3. Use saline nose sprays frequently 4. Saline irrigations of the nose can be very helpful if done frequently.  * 4X daily for 1 week*  * Use of a nettie pot can be helpful with this. Follow directions with this* 5. Drink plenty of fluids 6. Keep thermostat turn down low 7.For any cough or congestion- norel AD 8. For fever or aces or pains- take tylenol or ibuprofen appropriate for age and weight.  * for fevers greater than 101 orally you may alternate ibuprofen and tylenol every  3 hours.     2. Gastroesophageal reflux disease without esophagitis May be from reflux- going to try omeprazole and see if helps - omeprazole (PRILOSEC) 20 MG capsule; Take 1 capsule (20 mg total) by mouth daily.  Dispense: 30 capsule; Refill: 3  Meds ordered this encounter  Medications   Chlorphen-PE-Acetaminophen 4-10-325 MG TABS    Sig: Take 1 tablet by mouth every 6 (six) hours as needed.    Dispense:  20 tablet    Refill:  0    Order Specific Question:   Supervising Provider    Answer:  DETTINGER, JOSHUA A [[id number]]   predniSONE (DELTASONE) 20 MG tablet    Sig: 2 po at sametime daily for 5 days-    Dispense:  10 tablet    Refill:  0    Order Specific Question:   Supervising Provider    Answer:   Caryl Pina A [[id number]]   omeprazole (PRILOSEC) 20 MG capsule    Sig: Take 1 capsule (20 mg total) by mouth daily.    Dispense:  30 capsule    Refill:  3    Order Specific Question:   Supervising Provider    Answer:   Caryl Pina A [[id number]]     The above assessment and management plan was discussed with the patient. The patient verbalized understanding of and has agreed to the management plan. Patient is aware to call the clinic if symptoms persist or worsen. Patient is aware when to return to the clinic for a follow-up visit. Patient educated on when it is appropriate to go to the emergency department.   Mary-Margaret  Hassell Done, FNP

## 2022-06-24 NOTE — Patient Instructions (Signed)
Gastroesophageal Reflux Disease, Adult  Gastroesophageal reflux (GER) happens when acid from the stomach flows up into the tube that connects the mouth and the stomach (esophagus). Normally, food travels down the esophagus and stays in the stomach to be digested. With GER, food and stomach acid sometimes move back up into the esophagus. You may have a disease called gastroesophageal reflux disease (GERD) if the reflux: Happens often. Causes frequent or very bad symptoms. Causes problems such as damage to the esophagus. When this happens, the esophagus becomes sore and swollen. Over time, GERD can make small holes (ulcers) in the lining of the esophagus. What are the causes? This condition is caused by a problem with the muscle between the esophagus and the stomach. When this muscle is weak or not normal, it does not close properly to keep food and acid from coming back up from the stomach. The muscle can be weak because of: Tobacco use. Pregnancy. Having a certain type of hernia (hiatal hernia). Alcohol use. Certain foods and drinks, such as coffee, chocolate, onions, and peppermint. What increases the risk? Being overweight. Having a disease that affects your connective tissue. Taking NSAIDs, such a ibuprofen. What are the signs or symptoms? Heartburn. Difficult or painful swallowing. The feeling of having a lump in the throat. A bitter taste in the mouth. Bad breath. Having a lot of saliva. Having an upset or bloated stomach. Burping. Chest pain. Different conditions can cause chest pain. Make sure you see your doctor if you have chest pain. Shortness of breath or wheezing. A long-term cough or a cough at night. Wearing away of the surface of teeth (tooth enamel). Weight loss. How is this treated? Making changes to your diet. Taking medicine. Having surgery. Treatment will depend on how bad your symptoms are. Follow these instructions at home: Eating and drinking  Follow a  diet as told by your doctor. You may need to avoid foods and drinks such as: Coffee and tea, with or without caffeine. Drinks that contain alcohol. Energy drinks and sports drinks. Bubbly (carbonated) drinks or sodas. Chocolate and cocoa. Peppermint and mint flavorings. Garlic and onions. Horseradish. Spicy and acidic foods. These include peppers, chili powder, curry powder, vinegar, hot sauces, and BBQ sauce. Citrus fruit juices and citrus fruits, such as oranges, lemons, and limes. Tomato-based foods. These include red sauce, chili, salsa, and pizza with red sauce. Fried and fatty foods. These include donuts, french fries, potato chips, and high-fat dressings. High-fat meats. These include hot dogs, rib eye steak, sausage, ham, and bacon. High-fat dairy items, such as whole milk, butter, and cream cheese. Eat small meals often. Avoid eating large meals. Avoid drinking large amounts of liquid with your meals. Avoid eating meals during the 2-3 hours before bedtime. Avoid lying down right after you eat. Do not exercise right after you eat. Lifestyle  Do not smoke or use any products that contain nicotine or tobacco. If you need help quitting, ask your doctor. Try to lower your stress. If you need help doing this, ask your doctor. If you are overweight, lose an amount of weight that is healthy for you. Ask your doctor about a safe weight loss goal. General instructions Pay attention to any changes in your symptoms. Take over-the-counter and prescription medicines only as told by your doctor. Do not take aspirin, ibuprofen, or other NSAIDs unless your doctor says it is okay. Wear loose clothes. Do not wear anything tight around your waist. Raise (elevate) the head of your bed about   6 inches (15 cm). You may need to use a wedge to do this. Avoid bending over if this makes your symptoms worse. Keep all follow-up visits. Contact a doctor if: You have new symptoms. You lose weight and you  do not know why. You have trouble swallowing or it hurts to swallow. You have wheezing or a cough that keeps happening. You have a hoarse voice. Your symptoms do not get better with treatment. Get help right away if: You have sudden pain in your arms, neck, jaw, teeth, or back. You suddenly feel sweaty, dizzy, or light-headed. You have chest pain or shortness of breath. You vomit and the vomit is green, yellow, or black, or it looks like blood or coffee grounds. You faint. Your poop (stool) is red, bloody, or black. You cannot swallow, drink, or eat. These symptoms may represent a serious problem that is an emergency. Do not wait to see if the symptoms will go away. Get medical help right away. Call your local emergency services (911 in the [place] Do not drive yourself to the hospital. Summary If a person has gastroesophageal reflux disease (GERD), food and stomach acid move back up into the esophagus and cause symptoms or problems such as damage to the esophagus. Treatment will depend on how bad your symptoms are. Follow a diet as told by your doctor. Take all medicines only as told by your doctor. This information is not intended to replace advice given to you by your health care provider. Make sure you discuss any questions you have with your health care provider. Document Revised: 12/18/2019 Document Reviewed: 12/18/2019 Elsevier Patient Education  2023 [employer]vier Inc.  

## 2022-06-30 DIAGNOSIS — F901 Attention-deficit hyperactivity disorder, predominantly hyperactive type: Secondary | ICD-10-CM | POA: Diagnosis not present

## 2022-06-30 DIAGNOSIS — F411 Generalized anxiety disorder: Secondary | ICD-10-CM | POA: Diagnosis not present

## 2022-06-30 DIAGNOSIS — F331 Major depressive disorder, recurrent, moderate: Secondary | ICD-10-CM | POA: Diagnosis not present

## 2022-07-15 DIAGNOSIS — H6502 Acute serous otitis media, left ear: Secondary | ICD-10-CM | POA: Diagnosis not present

## 2022-07-15 DIAGNOSIS — R300 Dysuria: Secondary | ICD-10-CM | POA: Diagnosis not present

## 2022-07-15 DIAGNOSIS — J4541 Moderate persistent asthma with (acute) exacerbation: Secondary | ICD-10-CM | POA: Diagnosis not present

## 2022-07-16 ENCOUNTER — Ambulatory Visit: Payer: Medicaid Other

## 2022-09-01 ENCOUNTER — Telehealth: Payer: Self-pay | Admitting: Nurse Practitioner

## 2022-09-01 NOTE — Telephone Encounter (Signed)
Letter written and emailed to patient. Patient notified

## 2022-10-05 DIAGNOSIS — N3001 Acute cystitis with hematuria: Secondary | ICD-10-CM | POA: Diagnosis not present

## 2022-10-05 DIAGNOSIS — R109 Unspecified abdominal pain: Secondary | ICD-10-CM | POA: Diagnosis not present

## 2022-10-12 ENCOUNTER — Encounter: Payer: Self-pay | Admitting: Nurse Practitioner

## 2022-10-12 ENCOUNTER — Other Ambulatory Visit: Payer: Self-pay | Admitting: Nurse Practitioner

## 2022-10-12 DIAGNOSIS — K219 Gastro-esophageal reflux disease without esophagitis: Secondary | ICD-10-CM

## 2022-10-12 NOTE — Telephone Encounter (Signed)
30 days given today 10/12/22 pt ntbs - Amy Pham patient

## 2022-10-12 NOTE — Telephone Encounter (Signed)
Lmtcb to schedule appt Letter mailed 

## 2022-11-09 ENCOUNTER — Other Ambulatory Visit: Payer: Self-pay | Admitting: Nurse Practitioner

## 2022-11-09 ENCOUNTER — Encounter: Payer: Self-pay | Admitting: Nurse Practitioner

## 2022-11-09 DIAGNOSIS — K219 Gastro-esophageal reflux disease without esophagitis: Secondary | ICD-10-CM

## 2022-11-09 NOTE — Telephone Encounter (Signed)
LMTCB TO SCHEDULE APPT Letter mailed 

## 2022-11-09 NOTE — Telephone Encounter (Signed)
MMM NTBS 30 days given 10/12/22

## 2022-12-26 DIAGNOSIS — K0889 Other specified disorders of teeth and supporting structures: Secondary | ICD-10-CM | POA: Diagnosis not present

## 2022-12-26 DIAGNOSIS — J019 Acute sinusitis, unspecified: Secondary | ICD-10-CM | POA: Diagnosis not present

## 2023-03-02 ENCOUNTER — Encounter: Payer: Self-pay | Admitting: Nurse Practitioner

## 2023-03-02 ENCOUNTER — Encounter: Payer: Medicaid Other | Admitting: Nurse Practitioner

## 2023-03-02 NOTE — Progress Notes (Deleted)
   Subjective:    Patient ID: Amy Pham, female    DOB: November 28, 1990, 32 y.o.   MRN: [id number]   Chief Complaint: .annual physical   HPI:  Amy Pham is a 32 y.o. who identifies as a female who was assigned female at birth.   Social history: Lives with: *** Work history: ***   Comes in today for follow up of the following chronic medical issues:  1. Annual physical exam ***  2. Moderate persistent asthma without complication ***  3. Depression, major, single episode, mild (HCC) ***  4. Generalized anxiety disorder ***  5. Tobacco abuse ***   New complaints: ***  Allergies  Allergen Reactions   Cefzil [Cefprozil] Rash   Outpatient Encounter Medications as of 03/02/2023  Medication Sig   albuterol (VENTOLIN HFA) 108 (90 Base) MCG/ACT inhaler Inhale 2 puffs into the lungs every 6 (six) hours as needed for wheezing or shortness of breath.   atomoxetine (STRATTERA) 40 MG capsule Take 1 capsule (40 mg total) by mouth daily.   bacitracin 500 UNIT/GM ointment Apply 1 Application topically 2 (two) times daily. (Patient not taking: Reported on 06/24/2022)   Chlorphen-PE-Acetaminophen 4-10-325 MG TABS Take 1 tablet by mouth every 6 (six) hours as needed.   omeprazole (PRILOSEC) 20 MG capsule Take 1 capsule (20 mg total) by mouth daily. Needs office visit for further refills   predniSONE (DELTASONE) 20 MG tablet 2 po at sametime daily for 5 days-   promethazine-dextromethorphan (PROMETHAZINE-DM) 6.25-15 MG/5ML syrup Take 5 mLs by mouth 4 (four) times daily as needed for cough. (Patient not taking: Reported on 06/24/2022)   No facility-administered encounter medications on file as of 03/02/2023.    Past Surgical History:  Procedure Laterality Date   CESAREAN SECTION  05/31/2011   Procedure: CESAREAN SECTION;  Surgeon: Michael Litter, MD;  Location: [facility];  Service: Gynecology;  Laterality: N/A;  Primary cesarean section with delivery of baby boy at 64. apgars  8/9.   CESAREAN SECTION N/A 07/19/2013   Procedure: CESAREAN SECTION;  Surgeon: Adam Phenix, MD;  Location: [facility];  Service: Obstetrics;  Laterality: N/A;   LAPAROSCOPIC TUBAL LIGATION Bilateral 02/21/2014   Procedure: LAPAROSCOPIC TUBAL LIGATION;  Surgeon: Adam Phenix, MD;  Location: [facility];  Service: Gynecology;  Laterality: Bilateral;   TONSILLECTOMY     2007   TOOTH EXTRACTION N/A 07/25/2021   Procedure: DENTAL RESTORATION/EXTRACTIONS;  Surgeon: Ocie Doyne, DMD;  Location: [facility];  Service: Oral Surgery;  Laterality: N/A;    Family History  Problem Relation Age of Onset   Depression Mother    Mental illness Father    Diabetes Maternal Uncle    Arthritis Maternal Grandmother    Cancer Maternal Grandmother    Heart disease Paternal Grandfather    Liver disease Paternal Uncle        unknown etiology   Colon cancer Neg Hx       Controlled substance contract: ***     Review of Systems     Objective:   Physical Exam        Assessment & Plan:

## 2023-03-02 NOTE — Progress Notes (Signed)
Patient had to leave before seen so appointment was reschedule

## 2023-03-05 ENCOUNTER — Encounter: Payer: Self-pay | Admitting: Nurse Practitioner

## 2023-03-05 ENCOUNTER — Ambulatory Visit: Payer: Medicaid Other | Admitting: Nurse Practitioner

## 2023-03-08 ENCOUNTER — Encounter: Payer: Self-pay | Admitting: Nurse Practitioner

## 2023-04-27 ENCOUNTER — Other Ambulatory Visit (HOSPITAL_COMMUNITY)
Admission: RE | Admit: 2023-04-27 | Discharge: 2023-04-27 | Disposition: A | Discharge status: Home / Self Care | Payer: Medicaid Other | Source: Ambulatory Visit | Attending: Nurse Practitioner | Admitting: Nurse Practitioner

## 2023-04-27 ENCOUNTER — Ambulatory Visit (INDEPENDENT_AMBULATORY_CARE_PROVIDER_SITE_OTHER): Payer: Medicaid Other | Admitting: Nurse Practitioner

## 2023-04-27 ENCOUNTER — Ambulatory Visit (INDEPENDENT_AMBULATORY_CARE_PROVIDER_SITE_OTHER): Payer: Medicaid Other

## 2023-04-27 ENCOUNTER — Encounter: Payer: Self-pay | Admitting: Nurse Practitioner

## 2023-04-27 VITALS — BP 118/81 | HR 91 | Temp 98.1°F | Ht 66.0 in | Wt 180.0 lb

## 2023-04-27 DIAGNOSIS — F320 Major depressive disorder, single episode, mild: Secondary | ICD-10-CM | POA: Diagnosis not present

## 2023-04-27 DIAGNOSIS — F909 Attention-deficit hyperactivity disorder, unspecified type: Secondary | ICD-10-CM

## 2023-04-27 DIAGNOSIS — K219 Gastro-esophageal reflux disease without esophagitis: Secondary | ICD-10-CM | POA: Diagnosis not present

## 2023-04-27 DIAGNOSIS — F411 Generalized anxiety disorder: Secondary | ICD-10-CM | POA: Diagnosis not present

## 2023-04-27 DIAGNOSIS — Z720 Tobacco use: Secondary | ICD-10-CM

## 2023-04-27 DIAGNOSIS — Z0001 Encounter for general adult medical examination with abnormal findings: Secondary | ICD-10-CM

## 2023-04-27 DIAGNOSIS — Z0000 Encounter for general adult medical examination without abnormal findings: Secondary | ICD-10-CM | POA: Diagnosis not present

## 2023-04-27 DIAGNOSIS — R7989 Other specified abnormal findings of blood chemistry: Secondary | ICD-10-CM | POA: Diagnosis not present

## 2023-04-27 DIAGNOSIS — F1120 Opioid dependence, uncomplicated: Secondary | ICD-10-CM

## 2023-04-27 DIAGNOSIS — Z1383 Encounter for screening for respiratory disorder NEC: Secondary | ICD-10-CM | POA: Diagnosis not present

## 2023-04-27 MED ORDER — OMEPRAZOLE 20 MG PO CPDR
20.0000 mg | DELAYED_RELEASE_CAPSULE | Freq: Every day | ORAL | 1 refills | Status: DC
Start: 2023-04-27 — End: 2023-10-25

## 2023-04-27 MED ORDER — ATOMOXETINE HCL 40 MG PO CAPS: 40.0000 mg | ORAL_CAPSULE | Freq: Every day | ORAL | 1 refills | Status: DC

## 2023-04-27 NOTE — Patient Instructions (Signed)

## 2023-04-27 NOTE — Progress Notes (Signed)
Subjective:    Patient ID: Amy Pham, female    DOB: Apr 03, 1991, 32 y.o.   MRN: [id number]   Chief Complaint: medical management of chronic issues     HPI:  SHAKIARA Pham is a 32 y.o. who identifies as a female who was assigned female at birth.   Social history: Lives with: children Work history: [employer]   Comes in today for follow up of the following chronic medical issues:  1. Annual physical exam LMP- 04/11/23  2. Depression, major, single episode, mild (HCC)    04/27/2023   11:48 AM 06/24/2022   10:16 AM 04/14/2022    9:00 AM  Depression screen PHQ 2/9  Decreased Interest 2 0 0  Down, Depressed, Hopeless 1 0 0  PHQ - 2 Score 3 0 0  Altered sleeping 1  0  Tired, decreased energy 1  1  Change in appetite 0  0  Feeling bad or failure about yourself  0  0  Trouble concentrating 1  1  Moving slowly or fidgety/restless 0  0  Suicidal thoughts 0  0  PHQ-9 Score 6  2  Difficult doing work/chores Somewhat difficult  Not difficult at all     3. Generalized anxiety disorder Stress out a lot exspecially at work. Refuses to go on medication.    04/27/2023   11:49 AM 04/14/2022    9:01 AM 04/09/2022    8:53 AM 10/08/2021    4:27 PM  GAD 7 : Generalized Anxiety Score  Nervous, Anxious, on Edge 1 0 0 1  Control/stop worrying 1 0 0 0  Worry too much - different things 1 0 0 0  Trouble relaxing 1 0 3 1  Restless 1 0 3 0  Easily annoyed or irritable 0 0 1 1  Afraid - awful might happen 1 0 0 0  Total GAD 7 Score 6 0 7 3  Anxiety Difficulty  Not difficult at all Not difficult at all Somewhat difficult       4. Elevated LFTs No issues that she is aware of . Lab Results  Component Value Date   ALT 14 11/12/2021   AST 20 11/12/2021   ALKPHOS 60 11/12/2021   BILITOT 0.4 11/12/2021    5. Opioid dependence on agonist therapy (HCC) Currently  not on anything  6. Tobacco abuse Smokes over a pack a day.   New complaints: None  today  Allergies  Allergen Reactions   Cefzil [Cefprozil] Rash   Outpatient Encounter Medications as of 04/27/2023  Medication Sig   albuterol (VENTOLIN HFA) 108 (90 Base) MCG/ACT inhaler Inhale 2 puffs into the lungs every 6 (six) hours as needed for wheezing or shortness of breath. (Patient not taking: Reported on 03/02/2023)   atomoxetine (STRATTERA) 40 MG capsule Take 1 capsule (40 mg total) by mouth daily. (Patient not taking: Reported on 03/02/2023)   bacitracin 500 UNIT/GM ointment Apply 1 Application topically 2 (two) times daily. (Patient not taking: Reported on 06/24/2022)   Chlorphen-PE-Acetaminophen 4-10-325 MG TABS Take 1 tablet by mouth every 6 (six) hours as needed. (Patient not taking: Reported on 03/02/2023)   omeprazole (PRILOSEC) 20 MG capsule Take 1 capsule (20 mg total) by mouth daily. Needs office visit for further refills (Patient not taking: Reported on 03/02/2023)   predniSONE (DELTASONE) 20 MG tablet 2 po at sametime daily for 5 days- (Patient not taking: Reported on 03/02/2023)   promethazine-dextromethorphan (PROMETHAZINE-DM) 6.25-15 MG/5ML syrup Take 5 mLs by mouth 4 (four)  times daily as needed for cough. (Patient not taking: Reported on 06/24/2022)   No facility-administered encounter medications on file as of 04/27/2023.    Past Surgical History:  Procedure Laterality Date   CESAREAN SECTION  05/31/2011   Procedure: CESAREAN SECTION;  Surgeon: Michael Litter, MD;  Location: [facility];  Service: Gynecology;  Laterality: N/A;  Primary cesarean section with delivery of baby boy at 73. apgars 8/9.   CESAREAN SECTION N/A 07/19/2013   Procedure: CESAREAN SECTION;  Surgeon: Adam Phenix, MD;  Location: [facility];  Service: Obstetrics;  Laterality: N/A;   LAPAROSCOPIC TUBAL LIGATION Bilateral 02/21/2014   Procedure: LAPAROSCOPIC TUBAL LIGATION;  Surgeon: Adam Phenix, MD;  Location: [facility];  Service: Gynecology;  Laterality: Bilateral;   TONSILLECTOMY     2007   TOOTH  EXTRACTION N/A 07/25/2021   Procedure: DENTAL RESTORATION/EXTRACTIONS;  Surgeon: Ocie Doyne, DMD;  Location: [facility];  Service: Oral Surgery;  Laterality: N/A;    Family History  Problem Relation Age of Onset   Depression Mother    Mental illness Father    Diabetes Maternal Uncle    Arthritis Maternal Grandmother    Cancer Maternal Grandmother    Heart disease Paternal Grandfather    Liver disease Paternal Uncle        unknown etiology   Colon cancer Neg Hx       Controlled substance contract: n/a     Review of Systems  Constitutional:  Negative for diaphoresis.  Eyes:  Negative for pain.  Respiratory:  Negative for shortness of breath.   Cardiovascular:  Negative for chest pain, palpitations and leg swelling.  Gastrointestinal:  Negative for abdominal pain.  Endocrine: Negative for polydipsia.  Skin:  Negative for rash.  Neurological:  Negative for dizziness, weakness and headaches.  Hematological:  Does not bruise/bleed easily.  All other systems reviewed and are negative.      Objective:   Physical Exam Vitals and nursing note reviewed.  Constitutional:      General: She is not in acute distress.    Appearance: Normal appearance. She is well-developed.  HENT:     Head: Normocephalic.     Right Ear: Tympanic membrane normal.     Left Ear: Tympanic membrane normal.     Nose: Nose normal.     Mouth/Throat:     Mouth: Mucous membranes are moist.  Eyes:     Pupils: Pupils are equal, round, and reactive to light.  Neck:     Vascular: No carotid bruit or JVD.  Cardiovascular:     Rate and Rhythm: Normal rate and regular rhythm.     Heart sounds: Normal heart sounds.  Pulmonary:     Effort: Pulmonary effort is normal. No respiratory distress.     Breath sounds: Normal breath sounds. No wheezing or rales.  Chest:     Chest wall: No tenderness.  Abdominal:     General: Bowel sounds are normal. There is no distension or abdominal bruit.     Palpations: Abdomen  is soft. There is no hepatomegaly, splenomegaly, mass or pulsatile mass.     Tenderness: There is no abdominal tenderness.  Musculoskeletal:        General: Normal range of motion.     Cervical back: Normal range of motion and neck supple.  Lymphadenopathy:     Cervical: No cervical adenopathy.  Skin:    General: Skin is warm and dry.  Neurological:     Mental Status: She is  alert and oriented to person, place, and time.     Deep Tendon Reflexes: Reflexes are normal and symmetric.  Psychiatric:        Behavior: Behavior normal.        Thought Content: Thought content normal.        Judgment: Judgment normal.     BP 118/81   Pulse 91   Temp 98.1 F (36.7 C) (Temporal)   Ht 5\' 6"  (1.676 m)   Wt 180 lb (81.6 kg)   SpO2 100%   BMI 29.05 kg/m        Assessment & Plan:   MARCILLE BARMAN comes in today with chief complaint of No chief complaint on file.   Diagnosis and orders addressed:  1. Annual physical exam - Cytology - PAP - CBC with Differential/Platelet - CMP14+EGFR - Lipid panel - Thyroid Panel With TSH  2. Depression, major, single episode, mild (HCC) Stress management- refuses medication  3. Generalized anxiety disorder   4. Elevated LFTs Labs pending  5. Opioid dependence on agonist therapy (HCC)  6. Tobacco abuse Smoking cessation encouraged - DG Chest 2 View  7. Adult ADHD - atomoxetine (STRATTERA) 40 MG capsule; Take 1 capsule (40 mg total) by mouth daily.  Dispense: 90 capsule; Refill: 1  8. Gastroesophageal reflux disease without esophagitis Avoid spicy foods Do not eat 2 hours prior to bedtime  - omeprazole (PRILOSEC) 20 MG capsule; Take 1 capsule (20 mg total) by mouth daily. Needs office visit for further refills  Dispense: 90 capsule; Refill: 1   Labs pending Health Maintenance reviewed Diet and exercise encouraged  Follow up plan: 6 months   Mary-Margaret Daphine Deutscher, FNP

## 2023-04-28 LAB — CMP14+EGFR
ALT: 16 [IU]/L (ref 0–32)
AST: 18 [IU]/L (ref 0–40)
Albumin: 4.3 g/dL (ref 3.9–4.9)
Alkaline Phosphatase: 75 [IU]/L (ref 44–121)
BUN/Creatinine Ratio: 16 (ref 9–23)
BUN: 13 mg/dL (ref 6–20)
Bilirubin Total: 0.3 mg/dL (ref 0.0–1.2)
CO2: 25 mmol/L (ref 20–29)
Calcium: 9.7 mg/dL (ref 8.7–10.2)
Chloride: 102 mmol/L (ref 96–106)
Creatinine, Ser: 0.83 mg/dL (ref 0.57–1.00)
Globulin, Total: 2.9 g/dL (ref 1.5–4.5)
Glucose: 95 mg/dL (ref 70–99)
Potassium: 4.8 mmol/L (ref 3.5–5.2)
Sodium: 140 mmol/L (ref 134–144)
Total Protein: 7.2 g/dL (ref 6.0–8.5)
eGFR: 96 mL/min/{1.73_m2} (ref >59)

## 2023-04-28 LAB — THYROID PANEL WITH TSH
Free Thyroxine Index: 2.1 (ref 1.2–4.9)
T3 Uptake Ratio: 24 % (ref 24–39)
T4, Total: 8.6 ug/dL (ref 4.5–12.0)
TSH: 1.55 u[IU]/mL (ref 0.450–4.500)

## 2023-04-28 LAB — CBC WITH DIFFERENTIAL/PLATELET
Basophils Absolute: 0 10*3/uL (ref 0.0–0.2)
Basos: 1 %
EOS (ABSOLUTE): 0.1 10*3/uL (ref 0.0–0.4)
Eos: 1 %
Hematocrit: 42.3 % (ref 34.0–46.6)
Hemoglobin: 13.8 g/dL (ref 11.1–15.9)
Immature Grans (Abs): 0 10*3/uL (ref 0.0–0.1)
Immature Granulocytes: 0 %
Lymphocytes Absolute: 2.5 10*3/uL (ref 0.7–3.1)
Lymphs: 28 %
MCH: 32.5 pg (ref 26.6–33.0)
MCHC: 32.6 g/dL (ref 31.5–35.7)
MCV: 100 fL — ABNORMAL HIGH (ref 79–97)
Monocytes Absolute: 0.8 10*3/uL (ref 0.1–0.9)
Monocytes: 9 %
Neutrophils Absolute: 5.5 10*3/uL (ref 1.4–7.0)
Neutrophils: 61 %
Platelets: 244 10*3/uL (ref 150–450)
RBC: 4.25 x10E6/uL (ref 3.77–5.28)
RDW: 11.9 % (ref 11.7–15.4)
WBC: 8.9 10*3/uL (ref 3.4–10.8)

## 2023-04-28 LAB — LIPID PANEL
Chol/HDL Ratio: 2.5 ratio (ref 0.0–4.4)
Cholesterol, Total: 173 mg/dL (ref 100–199)
HDL: 69 mg/dL (ref >39)
LDL Chol Calc (NIH): 94 mg/dL (ref 0–99)
Triglycerides: 50 mg/dL (ref 0–149)
VLDL Cholesterol Cal: 10 mg/dL (ref 5–40)

## 2023-05-04 ENCOUNTER — Ambulatory Visit: Payer: Medicaid Other

## 2023-05-04 LAB — CYTOLOGY - PAP
Chlamydia: NEGATIVE
Comment: NEGATIVE
Comment: NEGATIVE
Comment: NORMAL
Diagnosis: NEGATIVE
Neisseria Gonorrhea: NEGATIVE
Trichomonas: NEGATIVE

## 2023-05-10 ENCOUNTER — Telehealth: Payer: Self-pay | Admitting: Family Medicine

## 2023-05-10 NOTE — Telephone Encounter (Signed)
Copied from CRM [id number]. Topic: Clinical - Medication Question >> May 10, 2023 11:16 AM Larwance Sachs wrote: Patient asks that Dr. Gennette Pac or available nurse reach out to her today regarding questions about atomoxetine (STRATTERA) 40 MG capsule, stated meds have been causing sleepiness and hot flashes. Patient scheduled an appointment for Friday regarding this as well and needing a flu shot

## 2023-05-10 NOTE — Telephone Encounter (Signed)
Pt has been notified.

## 2023-05-10 NOTE — Telephone Encounter (Signed)
Need to take at night instead of in morning

## 2023-05-14 ENCOUNTER — Ambulatory Visit: Payer: Medicaid Other | Admitting: Nurse Practitioner

## 2023-05-14 ENCOUNTER — Encounter: Payer: Self-pay | Admitting: Nurse Practitioner

## 2023-05-14 VITALS — BP 123/76 | HR 118 | Temp 98.6°F | Resp 20 | Ht 66.0 in | Wt 182.0 lb

## 2023-05-14 DIAGNOSIS — F909 Attention-deficit hyperactivity disorder, unspecified type: Secondary | ICD-10-CM | POA: Diagnosis not present

## 2023-05-14 DIAGNOSIS — Z23 Encounter for immunization: Secondary | ICD-10-CM | POA: Diagnosis not present

## 2023-05-14 MED ORDER — LISDEXAMFETAMINE DIMESYLATE 30 MG PO CAPS
30.0000 mg | ORAL_CAPSULE | Freq: Every day | ORAL | 0 refills | Status: DC
Start: 2023-05-14 — End: 2023-07-29

## 2023-05-14 NOTE — Patient Instructions (Signed)

## 2023-05-14 NOTE — Addendum Note (Signed)
Addended by: Bennie Pierini on: 05/14/2023 09:55 AM   Modules accepted: Level of Service

## 2023-05-14 NOTE — Progress Notes (Signed)
Subjective:    Patient ID: Amy Pham, female    DOB: 08-19-1990, 32 y.o.   MRN: [id number]   Chief Complaint: Discuss medications   HPI  Patient was put on Strattera. She heard that it leads to dementia and she is afraid of taking. She also says it makes her paranoid and she thinks people are reading her mind.   Patient Active Problem List   Diagnosis Date Noted   Elevated LFTs 06/05/2020   Hepatitis C antibody positive in blood 06/05/2020   Depression, major, single episode, mild (HCC) 02/19/2017   Generalized anxiety disorder 02/19/2017   Opioid dependence on agonist therapy (HCC) 03/24/2016   Asthma, moderate persistent 02/16/2013   Tobacco abuse 02/16/2013       Review of Systems  Constitutional:  Negative for diaphoresis.  Eyes:  Negative for pain.  Respiratory:  Negative for shortness of breath.   Cardiovascular:  Negative for chest pain, palpitations and leg swelling.  Gastrointestinal:  Negative for abdominal pain.  Endocrine: Negative for polydipsia.  Skin:  Negative for rash.  Neurological:  Negative for dizziness, weakness and headaches.  Hematological:  Does not bruise/bleed easily.  All other systems reviewed and are negative.      Objective:   Physical Exam Vitals and nursing note reviewed.  Constitutional:      General: She is not in acute distress.    Appearance: Normal appearance. She is well-developed.  Neck:     Vascular: No carotid bruit or JVD.  Cardiovascular:     Rate and Rhythm: Normal rate and regular rhythm.     Heart sounds: Normal heart sounds.  Pulmonary:     Effort: Pulmonary effort is normal. No respiratory distress.     Breath sounds: Normal breath sounds. No wheezing or rales.  Chest:     Chest wall: No tenderness.  Abdominal:     General: Bowel sounds are normal. There is no distension or abdominal bruit.     Palpations: Abdomen is soft. There is no hepatomegaly, splenomegaly, mass or pulsatile mass.      Tenderness: There is no abdominal tenderness.  Musculoskeletal:        General: Normal range of motion.     Cervical back: Normal range of motion and neck supple.  Lymphadenopathy:     Cervical: No cervical adenopathy.  Skin:    General: Skin is warm and dry.  Neurological:     Mental Status: She is alert and oriented to person, place, and time.     Deep Tendon Reflexes: Reflexes are normal and symmetric.  Psychiatric:        Behavior: Behavior normal.        Thought Content: Thought content normal.        Judgment: Judgment normal.    BP 123/76   Pulse (!) 118   Temp 98.6 F (37 C) (Temporal)   Resp 20   Ht 5\' 6"  (1.676 m)   Wt 182 lb (82.6 kg)   SpO2 98%   BMI 29.38 kg/m         Assessment & Plan:   Amy Pham in today with chief complaint of Discuss medications   1. Adult ADHD Stress management Stop straterra - lisdexamfetamine (VYVANSE) 30 MG capsule; Take 1 capsule (30 mg total) by mouth daily.  Dispense: 30 capsule; Refill: 0    The above assessment and management plan was discussed with the patient. The patient verbalized understanding of and has agreed to the management  plan. Patient is aware to call the clinic if symptoms persist or worsen. Patient is aware when to return to the clinic for a follow-up visit. Patient educated on when it is appropriate to go to the emergency department.   Mary-Margaret Daphine Deutscher, FNP

## 2023-07-16 DIAGNOSIS — X58XXXA Exposure to other specified factors, initial encounter: Secondary | ICD-10-CM | POA: Diagnosis not present

## 2023-07-16 DIAGNOSIS — S90222A Contusion of left lesser toe(s) with damage to nail, initial encounter: Secondary | ICD-10-CM | POA: Diagnosis not present

## 2023-07-26 NOTE — Telephone Encounter (Signed)
Needs to make an appointment to have testing done

## 2023-07-29 ENCOUNTER — Ambulatory Visit: Payer: Medicaid Other | Admitting: Nurse Practitioner

## 2023-07-29 ENCOUNTER — Encounter: Payer: Self-pay | Admitting: Nurse Practitioner

## 2023-07-29 VITALS — BP 114/77 | HR 106 | Temp 97.6°F | Ht 66.0 in | Wt 190.0 lb

## 2023-07-29 DIAGNOSIS — Z23 Encounter for immunization: Secondary | ICD-10-CM

## 2023-07-29 DIAGNOSIS — F320 Major depressive disorder, single episode, mild: Secondary | ICD-10-CM

## 2023-07-29 DIAGNOSIS — F411 Generalized anxiety disorder: Secondary | ICD-10-CM | POA: Diagnosis not present

## 2023-07-29 DIAGNOSIS — F909 Attention-deficit hyperactivity disorder, unspecified type: Secondary | ICD-10-CM | POA: Insufficient documentation

## 2023-07-29 MED ORDER — LISDEXAMFETAMINE DIMESYLATE 40 MG PO CAPS: 40.0000 mg | ORAL_CAPSULE | ORAL | 0 refills | Status: DC

## 2023-07-29 NOTE — Patient Instructions (Signed)

## 2023-07-29 NOTE — Addendum Note (Signed)
 Addended by: Cherylyn Cos on: 07/29/2023 05:18 PM   Modules accepted: Orders

## 2023-07-29 NOTE — Progress Notes (Signed)
 Subjective:    Patient ID: Amy Pham, female    DOB: 09/25/1990, 33 y.o.   MRN: [id number]   Chief Complaint: anxiety  HPI  Patient has long history of anxiety and depression. She is currently on  No medication and would like to start on something. Would prefer to have gene sight testing to see what would work best for her. - she also says vyvanse  was working well for her at first but now not helping. Patient Active Problem List   Diagnosis Date Noted   Elevated LFTs 06/05/2020   Hepatitis C antibody positive in blood 06/05/2020   Depression, major, single episode, mild (HCC) 02/19/2017   Generalized anxiety disorder 02/19/2017   Opioid dependence on agonist therapy (HCC) 03/24/2016   Asthma, moderate persistent 02/16/2013   Tobacco abuse 02/16/2013      05/14/2023    9:39 AM 04/27/2023   11:48 AM 06/24/2022   10:16 AM  Depression screen PHQ 2/9  Decreased Interest 0 2 0  Down, Depressed, Hopeless 0 1 0  PHQ - 2 Score 0 3 0  Altered sleeping 2 1   Tired, decreased energy 2 1   Change in appetite 1 0   Feeling bad or failure about yourself  0 0   Trouble concentrating 2 1   Moving slowly or fidgety/restless 0 0   Suicidal thoughts 0 0   PHQ-9 Score 7 6   Difficult doing work/chores Somewhat difficult Somewhat difficult        05/14/2023    9:39 AM 04/27/2023   11:49 AM 04/14/2022    9:01 AM 04/09/2022    8:53 AM  GAD 7 : Generalized Anxiety Score  Nervous, Anxious, on Edge 1 1 0 0  Control/stop worrying 1 1 0 0  Worry too much - different things 0 1 0 0  Trouble relaxing 0 1 0 3  Restless 1 1 0 3  Easily annoyed or irritable 0 0 0 1  Afraid - awful might happen 1 1 0 0  Total GAD 7 Score 4 6 0 7  Anxiety Difficulty Somewhat difficult  Not difficult at all Not difficult at all        Review of Systems  Constitutional:  Negative for diaphoresis.  Eyes:  Negative for pain.  Respiratory:  Negative for shortness of breath.   Cardiovascular:   Negative for chest pain, palpitations and leg swelling.  Gastrointestinal:  Negative for abdominal pain.  Endocrine: Negative for polydipsia.  Skin:  Negative for rash.  Neurological:  Negative for dizziness, weakness and headaches.  Hematological:  Does not bruise/bleed easily.  All other systems reviewed and are negative.      Objective:   Physical Exam Constitutional:      Appearance: Normal appearance.  Cardiovascular:     Rate and Rhythm: Normal rate and regular rhythm.     Heart sounds: Normal heart sounds.  Pulmonary:     Effort: Pulmonary effort is normal.     Breath sounds: Normal breath sounds.  Skin:    General: Skin is warm.  Neurological:     General: No focal deficit present.     Mental Status: She is alert and oriented to person, place, and time.  Psychiatric:        Mood and Affect: Mood normal.        Behavior: Behavior normal.    BP 114/77   Pulse (!) 106   Temp 97.6 F (36.4 C) (Temporal)   Ht  5' 6 (1.676 m)   Wt 190 lb (86.2 kg)   SpO2 98%   BMI 30.67 kg/m         Assessment & Plan:  Amy Pham in today with chief complaint of Wants to discuss Vyvanse    1. Depression, major, single episode, mild (HCC) (Primary) 2. Generalized anxiety disorder Gene sight test pending Stress management  3. Adult ADHD Increase vyvanse  from 30mg  to 40mg  daily - lisdexamfetamine (VYVANSE ) 40 MG capsule; Take 1 capsule (40 mg total) by mouth every morning.  Dispense: 30 capsule; Refill: 0    The above assessment and management plan was discussed with the patient. The patient verbalized understanding of and has agreed to the management plan. Patient is aware to call the clinic if symptoms persist or worsen. Patient is aware when to return to the clinic for a follow-up visit. Patient educated on when it is appropriate to go to the emergency department.   Mary-Margaret Gladis, FNP

## 2023-08-26 ENCOUNTER — Other Ambulatory Visit: Payer: Self-pay | Admitting: Nurse Practitioner

## 2023-08-26 DIAGNOSIS — F411 Generalized anxiety disorder: Secondary | ICD-10-CM

## 2023-08-26 DIAGNOSIS — F320 Major depressive disorder, single episode, mild: Secondary | ICD-10-CM

## 2023-08-26 MED ORDER — DESVENLAFAXINE SUCCINATE ER 50 MG PO TB24: 50.0000 mg | ORAL_TABLET | Freq: Every day | ORAL | 3 refills | Status: DC

## 2023-08-26 NOTE — Progress Notes (Signed)
 Genesight reviewed  Meds ordered this encounter  Medications   desvenlafaxine (PRISTIQ) 50 MG 24 hr tablet    Sig: Take 1 tablet (50 mg total) by mouth daily.    Dispense:  30 tablet    Refill:  3    Supervising Provider:   Arville Care A [[id number]]   Mary-Margaret Daphine Deutscher, FNP

## 2023-08-27 ENCOUNTER — Other Ambulatory Visit: Payer: Self-pay | Admitting: Nurse Practitioner

## 2023-08-27 DIAGNOSIS — F320 Major depressive disorder, single episode, mild: Secondary | ICD-10-CM

## 2023-08-27 DIAGNOSIS — F411 Generalized anxiety disorder: Secondary | ICD-10-CM

## 2023-08-27 MED ORDER — DESVENLAFAXINE SUCCINATE ER 50 MG PO TB24: 50.0000 mg | ORAL_TABLET | Freq: Every day | ORAL | 3 refills | Status: DC

## 2023-09-10 ENCOUNTER — Encounter: Payer: Self-pay | Admitting: Nurse Practitioner

## 2023-09-18 ENCOUNTER — Other Ambulatory Visit: Payer: Self-pay | Admitting: Family Medicine

## 2023-09-18 DIAGNOSIS — J40 Bronchitis, not specified as acute or chronic: Secondary | ICD-10-CM

## 2023-10-12 ENCOUNTER — Telehealth: Payer: Self-pay | Admitting: Nurse Practitioner

## 2023-10-12 DIAGNOSIS — Z0000 Encounter for general adult medical examination without abnormal findings: Secondary | ICD-10-CM

## 2023-10-12 NOTE — Telephone Encounter (Signed)
 Please advise.    Copied from CRM [id number]. Topic: Referral - Request for Referral >> Oct 12, 2023  8:57 AM Baldemar Lev wrote: Did the patient discuss referral with their provider in the last year? Yes (If No - schedule appointment) (If Yes - send message)  Appointment offered? Yes  Type of order/referral and detailed reason for visit: GYN  Preference of office, provider, location: Somewhere local in [place] or [place]. Not [place] or [place].   If referral order, have you been seen by this specialty before? No (If Yes, this issue or another issue? When? Where?  Can we respond through MyChart? Yes

## 2023-10-12 NOTE — Telephone Encounter (Signed)
 Referral placed.

## 2023-10-12 NOTE — Telephone Encounter (Signed)
 Ok for referral?

## 2023-10-25 ENCOUNTER — Other Ambulatory Visit: Payer: Self-pay | Admitting: Nurse Practitioner

## 2023-10-25 DIAGNOSIS — F909 Attention-deficit hyperactivity disorder, unspecified type: Secondary | ICD-10-CM

## 2023-10-25 DIAGNOSIS — K219 Gastro-esophageal reflux disease without esophagitis: Secondary | ICD-10-CM

## 2023-10-26 ENCOUNTER — Ambulatory Visit: Payer: Self-pay

## 2023-10-26 NOTE — Telephone Encounter (Signed)
 E2C2 scheduled appointment.

## 2023-10-26 NOTE — Telephone Encounter (Signed)
 Copied from CRM [id number]. Topic: Clinical - Red Word Triage >> Oct 26, 2023  9:30 AM Julie Oddi wrote: Red Word that prompted transfer to Nurse Triage: Numbness in Hands/Red  Chief Complaint: bilateral hands numbness/red Symptoms: see above Frequency: comes and goes when she is driving Pertinent Negatives: Patient denies headache, weakness, fever Disposition: [] ED /[] Urgent Care (no appt availability in office) / [x] Appointment(In office/virtual)/ []  [facility]/ [] Home Care/ [] Refused Recommended Disposition /[] [place] [facility]/ []  Follow-up with PCP Additional Notes: per protocol apt made; care advice given, denies questions; instructed to go to ER if becomes worse.   Reason for Disposition  [1] Numbness or tingling on both sides of body AND [2] is a new symptom present > 24 hours  Answer Assessment - Initial Assessment Questions 1. SYMPTOM: "What is the main symptom you are concerned about?" (e.g., weakness, numbness)     Numbness in both hands, reddened 2. ONSET: "When did this start?" (minutes, hours, days; while sleeping)     States when she's driving and holds phone to her ear 3. LAST NORMAL: "When was the last time you (the patient) were normal (no symptoms)?"     For awhile 4. PATTERN "Does this come and go, or has it been constant since it started?"  "Is it present now?"     Comes and goes 5. CARDIAC SYMPTOMS: "Have you had any of the following symptoms: chest pain, difficulty breathing, palpitations?"     no 6. NEUROLOGIC SYMPTOMS: "Have you had any of the following symptoms: headache, dizziness, vision loss, double vision, changes in speech, unsteady on your feet?"     no 7. OTHER SYMPTOMS: "Do you have any other symptoms?"     Right side of chest, bottom of lung, feels like a tightness, comes and goes. Denies pain 8. PREGNANCY: "Is there any chance you are pregnant?" "When was your last menstrual period?"     na  Protocols used: Neurologic Deficit-A-AH

## 2023-10-28 ENCOUNTER — Ambulatory Visit: Admitting: Nurse Practitioner

## 2023-10-28 ENCOUNTER — Encounter: Payer: Self-pay | Admitting: Nurse Practitioner

## 2023-10-28 VITALS — BP 128/77 | HR 100 | Temp 97.3°F | Ht 66.0 in | Wt 202.0 lb

## 2023-10-28 DIAGNOSIS — F411 Generalized anxiety disorder: Secondary | ICD-10-CM | POA: Diagnosis not present

## 2023-10-28 DIAGNOSIS — R200 Anesthesia of skin: Secondary | ICD-10-CM | POA: Diagnosis not present

## 2023-10-28 MED ORDER — HYDROXYZINE HCL 10 MG PO TABS: 10.0000 mg | ORAL_TABLET | Freq: Three times a day (TID) | ORAL | 1 refills | Status: DC | PRN

## 2023-10-28 NOTE — Progress Notes (Signed)
   Subjective:    Patient ID: Amy Pham, female    DOB: 12/25/1990, 33 y.o.   MRN: [id number]   Chief Complaint: Hands going numb while driving (Feet go to sleep as well/)   HPI  Patient in c/o numbness in hands when she is driving. Her feet go to sleep some.   She stopped taking her pristiq  and strattera  and vyvanse . She is trying to get through days without meds. Has occasional panic attacks and would like something she doe snot have to take all the time. Patient Active Problem List   Diagnosis Date Noted   Adult ADHD 07/29/2023   Elevated LFTs 06/05/2020   Hepatitis C antibody positive in blood 06/05/2020   Depression, major, single episode, mild (HCC) 02/19/2017   Generalized anxiety disorder 02/19/2017   Opioid dependence on agonist therapy (HCC) 03/24/2016   Asthma, moderate persistent 02/16/2013   Tobacco abuse 02/16/2013       Review of Systems  Constitutional:  Negative for diaphoresis.  Eyes:  Negative for pain.  Respiratory:  Negative for shortness of breath.   Cardiovascular:  Negative for chest pain, palpitations and leg swelling.  Gastrointestinal:  Negative for abdominal pain.  Endocrine: Negative for polydipsia.  Skin:  Negative for rash.  Neurological:  Negative for dizziness, weakness and headaches.  Hematological:  Does not bruise/bleed easily.  All other systems reviewed and are negative.      Objective:   Physical Exam Constitutional:      Appearance: Normal appearance. She is obese.  Cardiovascular:     Rate and Rhythm: Normal rate and regular rhythm.     Heart sounds: Normal heart sounds.  Pulmonary:     Effort: Pulmonary effort is normal.     Breath sounds: Normal breath sounds.  Musculoskeletal:     Comments: Positive tinel bil Positive phalen bil  Skin:    General: Skin is warm.  Neurological:     General: No focal deficit present.     Mental Status: She is alert and oriented to person, place, and time.  Psychiatric:         Mood and Affect: Mood normal.        Behavior: Behavior normal.    BP 128/77   Pulse 100   Temp (!) 97.3 F (36.3 C) (Temporal)   Ht 5\' 6"  (1.676 m)   Wt 202 lb (91.6 kg)   SpO2 100%   BMI 32.60 kg/m         Assessment & Plan:  Amy Pham in today with chief complaint of Hands going numb while driving (Feet go to sleep as well/)   1. Generalized anxiety disorder (Primary) Stress management - hydrOXYzine  (ATARAX ) 10 MG tablet; Take 1 tablet (10 mg total) by mouth 3 (three) times daily as needed.  Dispense: 30 tablet; Refill: 1  2. Bilateral hand numbness Keep diary of numbness episodes - Ambulatory referral to Orthopedic Surgery    The above assessment and management plan was discussed with the patient. The patient verbalized understanding of and has agreed to the management plan. Patient is aware to call the clinic if symptoms persist or worsen. Patient is aware when to return to the clinic for a follow-up visit. Patient educated on when it is appropriate to go to the emergency department.   Mary-Margaret Gaylyn Keas, FNP

## 2023-10-28 NOTE — Patient Instructions (Addendum)
 Pinched Nerve in the Wrist (Carpal Tunnel Syndrome): What to Know  Pinched nerve in the wrist (carpal tunnel syndrome, or CTS) is a nerve problem that causes pain, numbness, and weakness in the wrist, hand, and fingers. The carpal tunnel is a narrow space that is on the palm side of your wrist. Repeated wrist motions or certain diseases may cause swelling in the tunnel. This swelling can pinch the main nerve in the wrist (the median nerve). What are the causes? CTS may be caused by: Moving your hand and wrist over and over again while doing a task. Hurting the wrist. Arthritis. A pocket of fluid (cyst) or a growth (tumor) in the carpal tunnel. Fluid buildup when you are pregnant. Use of tools that vibrate. In some cases, the cause of CTS is not known. What increases the risk? You're more likely to have CTS if: You have a job that makes you do these things: Move your hand firmly over and over again. Work with tools that vibrate, such as drills or sanders. You're female. You have diabetes, obesity, thyroid problems, or kidney failure. What are the signs or symptoms? Symptoms of this condition include: A tingling feeling in your fingers. You may feel this pain in the thumb, index finger, or middle finger. Tingling or loss of feeling in your hand. Pain in your entire arm. This pain may get worse when you bend your wrist and elbow for a long time. Pain in your wrist that goes up your arm to your shoulder. Pain that goes down into your palm or fingers. Weakness in your hands. You may find it hard to grab and hold items. Your symptoms may feel worse during the night. How is this diagnosed? CTS is diagnosed with a medical history and physical exam. Tests and imaging may also be done to: Check the electrical signals sent by your nerves into the muscles. Check how well electrical signals pass through your nerves. Check possible causes of your CTS. These include X-rays, ultrasound, and  MRI. How is this treated? CTS may be treated with: Lifestyle changes. You will be asked to stop or change the activity that caused your problem. Physical therapy. This may include: Exercises that stretch and strengthen the muscles and tendons in the wrist and hand. Nerve gliding or flossing exercises. These help keep nerves moving smoothly through the tissues around them. Occupational therapy. You'll learn how to use your hand again. Medicines for pain and swelling. You may have injections in your wrist. A wrist splint or brace. Surgery. Follow these instructions at home: If you have a splint or brace: Wear the splint or brace as told. Take it off only if your provider says you can. Check the skin around it every day. Tell your provider if you see problems. Loosen the splint or brace if your fingers tingle, are numb, or turn cold and blue. Keep the splint or brace clean and dry. If the splint or brace isn't waterproof: Do not let it get wet. Cover it when you take a bath or shower. Use a cover that doesn't let any water in. Managing pain, stiffness, and swelling  Use ice or an ice pack as told. If you have a splint or brace that you can take off, remove it only as told. Place a towel between your skin and the ice. Leave the ice on for 20 minutes, 2-3 times a day. If your skin turns red, take off the ice right away to prevent skin damage. The risk  of damage is higher if you can't feel pain, heat, or cold. Move your fingers often to reduce stiffness and swelling. General instructions Take your medicines only as told. Rest your wrist and hand from activity that may cause pain. If your CTS is caused by things you do at work, talk with your employer about making changes. For example, you may need a wrist pad to use while typing. Exercise as told. Follow instructions on how to do nerve gliding or flossing exercises. These help keep nerves in moving smoothly through the tissues around  them. Keep all follow-up visits. This is important. Where to find more information American Academy of Orthopedic Surgeons: orthoinfo.aaos.[employer] of Neurological Disorders and Stroke: BasicFM.no Contact a health care provider if: You have new symptoms. Your pain is not controlled with medicines. Your symptoms get worse. Get help right away if: Your hand or wrist tingles or is numb, and the symptoms become very bad. This information is not intended to replace advice given to you by your health care provider. Make sure you discuss any questions you have with your health care provider. Document Revised: 04/20/2023 Document Reviewed: 02/05/2023 Elsevier Patient Education  2024 [employer].

## 2023-12-03 DIAGNOSIS — M79641 Pain in right hand: Secondary | ICD-10-CM | POA: Diagnosis not present

## 2023-12-03 DIAGNOSIS — M79642 Pain in left hand: Secondary | ICD-10-CM | POA: Diagnosis not present

## 2023-12-05 ENCOUNTER — Other Ambulatory Visit: Payer: Self-pay | Admitting: Nurse Practitioner

## 2023-12-05 DIAGNOSIS — F320 Major depressive disorder, single episode, mild: Secondary | ICD-10-CM

## 2023-12-05 DIAGNOSIS — F411 Generalized anxiety disorder: Secondary | ICD-10-CM

## 2024-03-08 ENCOUNTER — Encounter: Payer: Self-pay | Admitting: Family Medicine

## 2024-03-08 ENCOUNTER — Ambulatory Visit: Admitting: Family Medicine

## 2024-03-08 VITALS — BP 115/76 | HR 108 | Temp 98.7°F | Ht 66.0 in | Wt 191.0 lb

## 2024-03-08 DIAGNOSIS — J4521 Mild intermittent asthma with (acute) exacerbation: Secondary | ICD-10-CM | POA: Diagnosis not present

## 2024-03-08 DIAGNOSIS — J40 Bronchitis, not specified as acute or chronic: Secondary | ICD-10-CM

## 2024-03-08 DIAGNOSIS — Z23 Encounter for immunization: Secondary | ICD-10-CM

## 2024-03-08 MED ORDER — PREDNISONE 20 MG PO TABS: ORAL_TABLET | ORAL | 0 refills | Status: DC

## 2024-03-08 MED ORDER — ALBUTEROL SULFATE HFA 108 (90 BASE) MCG/ACT IN AERS: 2.0000 | INHALATION_SPRAY | Freq: Four times a day (QID) | RESPIRATORY_TRACT | 1 refills | Status: AC | PRN

## 2024-03-08 NOTE — Progress Notes (Signed)
 BP 115/76   Pulse (!) 108   Temp 98.7 F (37.1 C)   Ht 5' 6 (1.676 m)   Wt 191 lb (86.6 kg)   SpO2 96%   BMI 30.83 kg/m    Subjective:   Patient ID: Amy Pham, female    DOB: [birth year], 33 y.o.   MRN: [id number]  HPI: CHIVON LEPAGE is a 33 y.o. female presenting on 03/08/2024 for Cough (productive)   Discussed the use of AI scribe software for clinical note transcription with the patient, who gave verbal consent to proceed.  History of Present Illness   NELLI SWALLEY is a 33 year old female with asthma who presents with a persistent cough for two weeks.  She has been experiencing a persistent cough for two weeks, which is worsening. The cough is associated with musculoskeletal pain that radiates from the middle to the top of her back, particularly when bending over. Initially, she was coughing up yellow-green phlegm, but now the cough feels deeper with less phlegm production. She has been using Mucinex DM but ran out and is currently using a mixture of salt, honey, and lemon juice for relief.  She has a history of asthma but does not currently have an inhaler. She smokes every two days and has been trying to cut back, acknowledging the difficulty in quitting due to the addictive nature of smoking.  She reports soreness in her throat, particularly on the right side.          Relevant past medical, surgical, family and social history reviewed and updated as indicated. Interim medical history since our last visit reviewed. Allergies and medications reviewed and updated.  Review of Systems  Constitutional:  Negative for chills and fever.  HENT:  Positive for congestion, postnasal drip, rhinorrhea and sinus pressure. Negative for ear discharge, ear pain, sneezing and sore throat.   Eyes:  Negative for pain, redness and visual disturbance.  Respiratory:  Positive for cough. Negative for shortness of breath.   Cardiovascular:  Negative for leg swelling.   Genitourinary:  Negative for difficulty urinating and dysuria.  Musculoskeletal:  Positive for myalgias.  Skin:  Negative for rash.  Neurological:  Negative for dizziness, light-headedness and headaches.  Psychiatric/Behavioral:  Negative for agitation and behavioral problems.   All other systems reviewed and are negative.   Per HPI unless specifically indicated above   Allergies as of 03/08/2024       Reactions   Cefzil [cefprozil] Rash        Medication List        Accurate as of March 08, 2024 10:01 AM. If you have any questions, ask your nurse or doctor.          STOP taking these medications    bacitracin  500 UNIT/GM ointment Stopped by: Fonda LABOR Cortez Steelman   lisdexamfetamine 40 MG capsule Commonly known as: Vyvanse  Stopped by: Fonda LABOR Elynore Dolinski   omeprazole  20 MG capsule Commonly known as: PRILOSEC Stopped by: Fonda LABOR Yocelin Vanlue       TAKE these medications    albuterol  108 (90 Base) MCG/ACT inhaler Commonly known as: Ventolin  HFA Inhale 2 puffs into the lungs every 6 (six) hours as needed for wheezing or shortness of breath.   atomoxetine  40 MG capsule Commonly known as: STRATTERA  TAKE 1 CAPSULE BY MOUTH DAILY.   desvenlafaxine  50 MG 24 hr tablet Commonly known as: PRISTIQ  TAKE ONE TABLET BY MOUTH EVERY DAY   hydrOXYzine  10 MG tablet Commonly known  as: ATARAX  Take 1 tablet (10 mg total) by mouth 3 (three) times daily as needed.   predniSONE  20 MG tablet Commonly known as: DELTASONE  2 po at same time daily for 5 days Started by: Fonda LABOR Varonica Siharath         Objective:   BP 115/76   Pulse (!) 108   Temp 98.7 F (37.1 C)   Ht 5' 6 (1.676 m)   Wt 191 lb (86.6 kg)   SpO2 96%   BMI 30.83 kg/m   Wt Readings from Last 3 Encounters:  03/08/24 191 lb (86.6 kg)  10/28/23 202 lb (91.6 kg)  07/29/23 190 lb (86.2 kg)    Physical Exam Vitals and nursing note reviewed.  Constitutional:      Appearance: Normal appearance.   Cardiovascular:     Rate and Rhythm: Normal rate and regular rhythm.     Heart sounds: No murmur heard. Neurological:     Mental Status: She is alert.    Physical Exam   HEENT: Throat without redness. Small lymph node palpable on right side. CHEST: Lungs clear to auscultation bilaterally.         Assessment & Plan:   Problem List Items Addressed This Visit   None Visit Diagnoses       Mild intermittent asthma with exacerbation    -  Primary   Relevant Medications   albuterol  (VENTOLIN  HFA) 108 (90 Base) MCG/ACT inhaler   predniSONE  (DELTASONE ) 20 MG tablet     Bronchitis       Relevant Medications   albuterol  (VENTOLIN  HFA) 108 (90 Base) MCG/ACT inhaler   predniSONE  (DELTASONE ) 20 MG tablet          Asthma with acute exacerbation and cough Asthma exacerbation with worsening cough, likely not pneumonia. - Prescribe inhaler. - Prescribe short course of prednisone . - Continue Mucinex or honey and tea for cough relief.  Musculoskeletal pain due to coughing Pain in ribs and back from coughing, likely sore rib muscles. - Prescribe prednisone  and inhaler to reduce inflammation and coughing. - Continue supportive measures like Mucinex or honey and tea.  Nicotine  dependence, cigarettes Chronic nicotine  dependence with reduced smoking. Discussed risks of asthma and COPD, and smoking cessation options. - Discuss smoking cessation options with primary care provider Ronal Quant. - Consider medications like Chantix or Wellbutrin  for smoking cessation. - Explore insurance coverage for patches or gums. - Schedule follow-up with primary care provider to discuss smoking cessation plan.          Follow up plan: Return if symptoms worsen or fail to improve.  Counseling provided for all of the vaccine components No orders of the defined types were placed in this encounter.   Fonda Levins, MD [facility] 03/08/2024, 10:01 AM

## 2024-04-27 ENCOUNTER — Encounter: Payer: Self-pay | Admitting: Nurse Practitioner

## 2024-04-27 ENCOUNTER — Ambulatory Visit (INDEPENDENT_AMBULATORY_CARE_PROVIDER_SITE_OTHER): Payer: Medicaid Other | Admitting: Nurse Practitioner

## 2024-04-27 VITALS — BP 115/78 | HR 88 | Temp 98.0°F | Ht 66.0 in | Wt 194.0 lb

## 2024-04-27 DIAGNOSIS — R7689 Other specified abnormal immunological findings in serum: Secondary | ICD-10-CM

## 2024-04-27 DIAGNOSIS — R7989 Other specified abnormal findings of blood chemistry: Secondary | ICD-10-CM | POA: Diagnosis not present

## 2024-04-27 DIAGNOSIS — F320 Major depressive disorder, single episode, mild: Secondary | ICD-10-CM

## 2024-04-27 DIAGNOSIS — F1120 Opioid dependence, uncomplicated: Secondary | ICD-10-CM

## 2024-04-27 DIAGNOSIS — F411 Generalized anxiety disorder: Secondary | ICD-10-CM

## 2024-04-27 DIAGNOSIS — F909 Attention-deficit hyperactivity disorder, unspecified type: Secondary | ICD-10-CM

## 2024-04-27 DIAGNOSIS — Z0001 Encounter for general adult medical examination with abnormal findings: Secondary | ICD-10-CM | POA: Diagnosis not present

## 2024-04-27 DIAGNOSIS — Z0000 Encounter for general adult medical examination without abnormal findings: Secondary | ICD-10-CM

## 2024-04-27 LAB — LIPID PANEL

## 2024-04-27 MED ORDER — HYDROXYZINE HCL 10 MG PO TABS: 10.0000 mg | ORAL_TABLET | Freq: Three times a day (TID) | ORAL | 1 refills | Status: AC | PRN

## 2024-04-27 MED ORDER — ESCITALOPRAM OXALATE 20 MG PO TABS: 20.0000 mg | ORAL_TABLET | Freq: Every day | ORAL | 5 refills | Status: AC

## 2024-04-27 MED ORDER — ATOMOXETINE HCL 40 MG PO CAPS
40.0000 mg | ORAL_CAPSULE | Freq: Every day | ORAL | 1 refills | Status: AC
Start: 2024-04-27 — End: ?

## 2024-04-27 NOTE — Progress Notes (Signed)
 Subjective:    Patient ID: Amy Pham, female    DOB: 09-27-1990, 33 y.o.   MRN: [id number]   Chief Complaint: medical management of chronic issues     HPI:  Amy Pham is a 33 y.o. who identifies as a female who was assigned female at birth.   Social history: Lives with: children Work history: [employer]   Comes in today for follow up of the following chronic medical issues:  1. Annual physical exam LMP- 04/26/24 last PAP was 04/27/23  2. Depression, major, single episode, mild (HCC) Stopped taking pristiq . Says she need to be on something. Would like to try lexapro again.     04/27/2024    8:17 AM 03/08/2024    9:55 AM 03/08/2024    9:47 AM  Depression screen PHQ 2/9  Decreased Interest 0  0  Down, Depressed, Hopeless 0  0  PHQ - 2 Score 0  0  Altered sleeping 0 2   Tired, decreased energy 0 1   Change in appetite 0 1   Feeling bad or failure about yourself  0 0   Trouble concentrating 0 1   Moving slowly or fidgety/restless 0 0   Suicidal thoughts 0 0   PHQ-9 Score 0    Difficult doing work/chores Not difficult at all Not difficult at all      3. Generalized anxiety disorder Stress out a lot exspecially at work. Refuses to go on medication.     04/27/2024    8:17 AM 03/08/2024    9:56 AM 10/28/2023    9:06 AM 07/29/2023    4:24 PM  GAD 7 : Generalized Anxiety Score  Nervous, Anxious, on Edge 0 0 0 1  Control/stop worrying 0 0 1 2  Worry too much - different things 0 0 1 2  Trouble relaxing 0 1 1 1   Restless 0 0 1 0  Easily annoyed or irritable 0 1 0 3  Afraid - awful might happen 0 1 0 2  Total GAD 7 Score 0 3 4 11   Anxiety Difficulty Not difficult at all Not difficult at all Somewhat difficult Somewhat difficult     4. Elevated LFTs No issues that she is aware of 5. Hx hep C .Patient took mavyret  for 8 weeks in 2023. Denies any symptoms  or issues.  Lab Results  Component Value Date   ALT 16 04/27/2023   AST 18 04/27/2023   ALKPHOS  75 04/27/2023   BILITOT 0.3 04/27/2023    5. Opioid dependence on agonist therapy (HCC) Currently  not on anything. Has been drug free for over 2 years.   6. Tobacco abuse Smokes over a pack a day.   New complaints: None today  Allergies  Allergen Reactions   Cefzil [Cefprozil] Rash   Outpatient Encounter Medications as of 04/27/2024  Medication Sig   albuterol  (VENTOLIN  HFA) 108 (90 Base) MCG/ACT inhaler Inhale 2 puffs into the lungs every 6 (six) hours as needed for wheezing or shortness of breath.   atomoxetine  (STRATTERA ) 40 MG capsule TAKE 1 CAPSULE BY MOUTH DAILY.   desvenlafaxine  (PRISTIQ ) 50 MG 24 hr tablet TAKE ONE TABLET BY MOUTH EVERY DAY   hydrOXYzine  (ATARAX ) 10 MG tablet Take 1 tablet (10 mg total) by mouth 3 (three) times daily as needed.   predniSONE  (DELTASONE ) 20 MG tablet 2 po at same time daily for 5 days   No facility-administered encounter medications on file as of 04/27/2024.    Past  Surgical History:  Procedure Laterality Date   CESAREAN SECTION  05/31/2011   Procedure: CESAREAN SECTION;  Surgeon: Ovid DELENA All, MD;  Location: [facility];  Service: Gynecology;  Laterality: N/A;  Primary cesarean section with delivery of baby boy at 81. apgars 8/9.   CESAREAN SECTION N/A 07/19/2013   Procedure: CESAREAN SECTION;  Surgeon: Lynwood KANDICE Solomons, MD;  Location: [facility];  Service: Obstetrics;  Laterality: N/A;   LAPAROSCOPIC TUBAL LIGATION Bilateral 02/21/2014   Procedure: LAPAROSCOPIC TUBAL LIGATION;  Surgeon: Lynwood KANDICE Solomons, MD;  Location: [facility];  Service: Gynecology;  Laterality: Bilateral;   TONSILLECTOMY     2007   TOOTH EXTRACTION N/A 07/25/2021   Procedure: DENTAL RESTORATION/EXTRACTIONS;  Surgeon: Sheryle Hamilton, DMD;  Location: [facility];  Service: Oral Surgery;  Laterality: N/A;    Family History  Problem Relation Age of Onset   Depression Mother    Mental illness Father    Diabetes Maternal Uncle    Arthritis Maternal Grandmother    Cancer Maternal  Grandmother    Heart disease Paternal Grandfather    Liver disease Paternal Uncle        unknown etiology   Colon cancer Neg Hx       Controlled substance contract: n/a     Review of Systems  Constitutional:  Negative for diaphoresis.  Eyes:  Negative for pain.  Respiratory:  Negative for shortness of breath.   Cardiovascular:  Negative for chest pain, palpitations and leg swelling.  Gastrointestinal:  Negative for abdominal pain.  Endocrine: Negative for polydipsia.  Skin:  Negative for rash.  Neurological:  Negative for dizziness, weakness and headaches.  Hematological:  Does not bruise/bleed easily.  All other systems reviewed and are negative.      Objective:   Physical Exam Vitals and nursing note reviewed.  Constitutional:      General: She is not in acute distress.    Appearance: Normal appearance. She is well-developed.  HENT:     Head: Normocephalic.     Right Ear: Tympanic membrane normal.     Left Ear: Tympanic membrane normal.     Nose: Nose normal.     Mouth/Throat:     Mouth: Mucous membranes are moist.  Eyes:     Pupils: Pupils are equal, round, and reactive to light.  Neck:     Vascular: No carotid bruit or JVD.  Cardiovascular:     Rate and Rhythm: Normal rate and regular rhythm.     Heart sounds: Normal heart sounds.  Pulmonary:     Effort: Pulmonary effort is normal. No respiratory distress.     Breath sounds: Normal breath sounds. No wheezing or rales.  Chest:     Chest wall: No tenderness.  Abdominal:     General: Bowel sounds are normal. There is no distension or abdominal bruit.     Palpations: Abdomen is soft. There is no hepatomegaly, splenomegaly, mass or pulsatile mass.     Tenderness: There is no abdominal tenderness.  Musculoskeletal:        General: Normal range of motion.     Cervical back: Normal range of motion and neck supple.  Lymphadenopathy:     Cervical: No cervical adenopathy.  Skin:    General: Skin is warm and  dry.  Neurological:     Mental Status: She is alert and oriented to person, place, and time.     Deep Tendon Reflexes: Reflexes are normal and symmetric.  Psychiatric:  Behavior: Behavior normal.        Thought Content: Thought content normal.        Judgment: Judgment normal.     BP 115/78   Pulse 88   Temp 98 F (36.7 C) (Temporal)   Ht 5' 6 (1.676 m)   Wt 194 lb (88 kg)   SpO2 99%   BMI 31.31 kg/m         Assessment & Plan:   RENISE GILLIES comes in today with chief complaint of annual physical  Diagnosis and orders addressed:  1. Annual physical exam - Cytology - PAP - CBC with Differential/Platelet - CMP14+EGFR - Lipid panel - Thyroid  Panel With TSH  2. Depression, major, single episode, mild (HCC) Stress management- refuses medication  3. Generalized anxiety disorder   4. Elevated LFTs Labs pending  5. Opioid dependence on agonist therapy (HCC)  6. Tobacco abuse Smoking cessation encouraged - DG Chest 2 View  7. Adult ADHD - atomoxetine  (STRATTERA ) 40 MG capsule; Take 1 capsule (40 mg total) by mouth daily.  Dispense: 90 capsule; Refill: 1  8. Gastroesophageal reflux disease without esophagitis Avoid spicy foods Do not eat 2 hours prior to bedtime  - omeprazole  (PRILOSEC) 20 MG capsule; Take 1 capsule (20 mg total) by mouth daily. Needs office visit for further refills  Dispense: 90 capsule; Refill: 1   Labs pending Health Maintenance reviewed Diet and exercise encouraged  Follow up plan: 6 months   Mary-Margaret Gladis, FNP

## 2024-04-27 NOTE — Patient Instructions (Signed)
 Exercising to Stay Healthy To become healthy and stay healthy, it is recommended that you do moderate-intensity and vigorous-intensity exercise. You can tell that you are exercising at a moderate intensity if your heart starts beating faster and you start breathing faster but can still hold a conversation. You can tell that you are exercising at a vigorous intensity if you are breathing much harder and faster and cannot hold a conversation while exercising. How can exercise benefit me? Exercising regularly is important. It has many health benefits, such as: Improving overall fitness, flexibility, and endurance. Increasing bone density. Helping with weight control. Decreasing body fat. Increasing muscle strength and endurance. Reducing stress and tension, anxiety, depression, or anger. Improving overall health. What guidelines should I follow while exercising? Before you start a new exercise program, talk with your health care provider. Do not exercise so much that you hurt yourself, feel dizzy, or get very short of breath. Wear comfortable clothes and wear shoes with good support. Drink plenty of water while you exercise to prevent dehydration or heat stroke. Work out until your breathing and your heartbeat get faster (moderate intensity). How often should I exercise? Choose an activity that you enjoy, and set realistic goals. Your health care provider can help you make an activity plan that is individually designed and works best for you. Exercise regularly as told by your health care provider. This may include: Doing strength training two times a week, such as: Lifting weights. Using resistance bands. Push-ups. Sit-ups. Yoga. Doing a certain intensity of exercise for a given amount of time. Choose from these options: A total of 150 minutes of moderate-intensity exercise every week. A total of 75 minutes of vigorous-intensity exercise every week. A mix of moderate-intensity and  vigorous-intensity exercise every week. Children, pregnant women, people who have not exercised regularly, people who are overweight, and older adults may need to talk with a health care provider about what activities are safe to perform. If you have a medical condition, be sure to talk with your health care provider before you start a new exercise program. What are some exercise ideas? Moderate-intensity exercise ideas include: Walking 1 mile (1.6 km) in about 15 minutes. Biking. Hiking. Golfing. Dancing. Water aerobics. Vigorous-intensity exercise ideas include: Walking 4.5 miles (7.2 km) or more in about 1 hour. Jogging or running 5 miles (8 km) in about 1 hour. Biking 10 miles (16.1 km) or more in about 1 hour. Lap swimming. Roller-skating or in-line skating. Cross-country skiing. Vigorous competitive sports, such as football, basketball, and soccer. Jumping rope. Aerobic dancing. What are some everyday activities that can help me get exercise? Yard work, such as: Child psychotherapist. Raking and bagging leaves. Washing your car. Pushing a stroller. Shoveling snow. Gardening. Washing windows or floors. How can I be more active in my day-to-day activities? Use stairs instead of an elevator. Take a walk during your lunch break. If you drive, park your car farther away from your work or school. If you take public transportation, get off one stop early and walk the rest of the way. Stand up or walk around during all of your indoor phone calls. Get up, stretch, and walk around every 30 minutes throughout the day. Enjoy exercise with a friend. Support to continue exercising will help you keep a regular routine of activity. Where to find more information You can find more information about exercising to stay healthy from: U.S. Department of Health and Human Services: ThisPath.fi Centers for Disease Control and Prevention (  CDC): FootballExhibition.com.br Summary Exercising regularly is  important. It will improve your overall fitness, flexibility, and endurance. Regular exercise will also improve your overall health. It can help you control your weight, reduce stress, and improve your bone density. Do not exercise so much that you hurt yourself, feel dizzy, or get very short of breath. Before you start a new exercise program, talk with your health care provider. This information is not intended to replace advice given to you by your health care provider. Make sure you discuss any questions you have with your health care provider. Document Revised: 10/04/2020 Document Reviewed: 10/04/2020 Elsevier Patient Education  2024 [employer].

## 2024-04-28 ENCOUNTER — Ambulatory Visit: Payer: Self-pay | Admitting: Nurse Practitioner

## 2024-04-28 LAB — CBC WITH DIFFERENTIAL/PLATELET
Basophils Absolute: 0 x10E3/uL (ref 0.0–0.2)
Basos: 0 %
EOS (ABSOLUTE): 0.1 x10E3/uL (ref 0.0–0.4)
Eos: 1 %
Hematocrit: 44.1 % (ref 34.0–46.6)
Hemoglobin: 14.1 g/dL (ref 11.1–15.9)
Immature Grans (Abs): 0 x10E3/uL (ref 0.0–0.1)
Immature Granulocytes: 0 %
Lymphocytes Absolute: 2.5 x10E3/uL (ref 0.7–3.1)
Lymphs: 25 %
MCH: 32 pg (ref 26.6–33.0)
MCHC: 32 g/dL (ref 31.5–35.7)
MCV: 100 fL — ABNORMAL HIGH (ref 79–97)
Monocytes Absolute: 0.9 x10E3/uL (ref 0.1–0.9)
Monocytes: 9 %
Neutrophils Absolute: 6.5 x10E3/uL (ref 1.4–7.0)
Neutrophils: 65 %
Platelets: 243 x10E3/uL (ref 150–450)
RBC: 4.4 x10E6/uL (ref 3.77–5.28)
RDW: 12.1 % (ref 11.7–15.4)
WBC: 10 x10E3/uL (ref 3.4–10.8)

## 2024-04-28 LAB — LIPID PANEL
Cholesterol, Total: 169 mg/dL (ref 100–199)
HDL: 52 mg/dL (ref >39)
LDL CALC COMMENT:: 3.3 ratio (ref 0.0–4.4)
LDL Chol Calc (NIH): 90 mg/dL (ref 0–99)
Triglycerides: 157 mg/dL — AB (ref 0–149)
VLDL Cholesterol Cal: 27 mg/dL (ref 5–40)

## 2024-04-28 LAB — THYROID PANEL WITH TSH
Free Thyroxine Index: 1.7 (ref 1.2–4.9)
T3 Uptake Ratio: 26 % (ref 24–39)
T4, Total: 6.4 ug/dL (ref 4.5–12.0)
TSH: 1.72 u[IU]/mL (ref 0.450–4.500)

## 2024-04-28 LAB — VITAMIN D 25 HYDROXY (VIT D DEFICIENCY, FRACTURES): Vit D, 25-Hydroxy: 26.7 ng/mL — AB (ref 30.0–100.0)

## 2024-04-28 LAB — CMP14+EGFR
ALT: 12 IU/L (ref 0–32)
AST: 15 IU/L (ref 0–40)
Albumin: 4.2 g/dL (ref 3.9–4.9)
Alkaline Phosphatase: 81 IU/L (ref 41–116)
BUN/Creatinine Ratio: 14 (ref 9–23)
BUN: 12 mg/dL (ref 6–20)
Bilirubin Total: 0.2 mg/dL (ref 0.0–1.2)
CO2: 24 mmol/L (ref 20–29)
Calcium: 9.1 mg/dL (ref 8.7–10.2)
Chloride: 101 mmol/L (ref 96–106)
Creatinine, Ser: 0.85 mg/dL (ref 0.57–1.00)
Globulin, Total: 2.2 g/dL (ref 1.5–4.5)
Glucose: 72 mg/dL (ref 70–99)
Potassium: 4.6 mmol/L (ref 3.5–5.2)
Sodium: 137 mmol/L (ref 134–144)
Total Protein: 6.4 g/dL (ref 6.0–8.5)
eGFR: 93 mL/min/1.73 (ref >59)

## 2024-07-12 ENCOUNTER — Encounter: Payer: Self-pay | Admitting: Family Medicine

## 2024-07-12 ENCOUNTER — Ambulatory Visit: Admitting: Family Medicine

## 2024-07-12 VITALS — BP 107/74 | HR 105 | Temp 97.7°F | Ht 66.0 in | Wt 190.0 lb

## 2024-07-12 DIAGNOSIS — R6889 Other general symptoms and signs: Secondary | ICD-10-CM | POA: Diagnosis not present

## 2024-07-12 DIAGNOSIS — J111 Influenza due to unidentified influenza virus with other respiratory manifestations: Secondary | ICD-10-CM

## 2024-07-12 LAB — RAPID STREP SCREEN (MED CTR MEBANE ONLY): Strep Gp A Ag, IA W/Reflex: NEGATIVE

## 2024-07-12 LAB — VERITOR SARS-COV-2 AND FLU A+B
BD Veritor SARS-CoV-2 Ag: NEGATIVE
Influenza A: POSITIVE — AB
Influenza B: NEGATIVE

## 2024-07-12 LAB — CULTURE, GROUP A STREP

## 2024-07-12 MED ORDER — OSELTAMIVIR PHOSPHATE 75 MG PO CAPS: 75.0000 mg | ORAL_CAPSULE | Freq: Two times a day (BID) | ORAL | 0 refills | Status: AC

## 2024-07-12 MED ORDER — PROMETHAZINE-DM 6.25-15 MG/5ML PO SYRP: 5.0000 mL | ORAL_SOLUTION | Freq: Four times a day (QID) | ORAL | 0 refills | Status: AC | PRN

## 2024-07-12 NOTE — Progress Notes (Signed)
 "  Subjective:  Patient ID: Amy Pham, female    DOB: Sep 15, 1990  Age: 34 y.o. MRN: [id number]  CC: sick (Started on Saturday. Coughing, sore throat, SOB and chest pain, Thick mucous that is not coming up. Nausea and diarrhea. Body aches. Low grade fever. Covid test neg. )   HPI  Discussed the use of AI scribe software for clinical note transcription with the patient, who gave verbal consent to proceed.  History of Present Illness Amy Pham is a 34 year old female who presents with cough and difficulty breathing.  She has been experiencing a persistent cough since Saturday, which has progressively worsened. The cough is severe enough to cause her to wake up coughing to the point of almost vomiting, though she is unable to expel anything. It is accompanied by a sensation of tightness in her chest and pain that radiates to her back.  She has a slight fever, recorded at 101.64F, and significant congestion. This morning, she woke up unable to breathe or cough effectively, which was frightening for her. She reports feeling chest tightness and pain, but does not specifically describe wheezing or crackly sounds herself.  She denies recent stimulant use. She feels weak and needs assistance to get up, which she attributes to her current illness. She also mentions not eating well due to mucus in her stomach.          07/12/2024   12:32 PM 04/27/2024    8:17 AM 03/08/2024    9:55 AM  Depression screen PHQ 2/9  Decreased Interest 1 0   Down, Depressed, Hopeless 1 0   PHQ - 2 Score 2 0   Altered sleeping 1 0 2  Tired, decreased energy 1 0 1  Change in appetite 1 0 1  Feeling bad or failure about yourself  1 0 0  Trouble concentrating 1 0 1  Moving slowly or fidgety/restless 1 0 0  Suicidal thoughts 0 0 0  PHQ-9 Score 8 0    Difficult doing work/chores Somewhat difficult Not difficult at all Not difficult at all     Data saved with a previous flowsheet row definition     History Yusra has a past medical history of Anxiety, Asthma, Back pain, Complication of anesthesia, Depression, Headache(784.0), Hepatitis, HSV (herpes simplex virus) infection, PONV (postoperative nausea and vomiting), and Substance abuse (HCC).   She has a past surgical history that includes Tonsillectomy; Cesarean section (05/31/2011); Cesarean section (N/A, 07/19/2013); Laparoscopic tubal ligation (Bilateral, 02/21/2014); and Tooth Extraction (N/A, 07/25/2021).   Her family history includes Arthritis in her maternal grandmother; Cancer in her maternal grandmother; Depression in her mother; Diabetes in her maternal uncle; Heart disease in her paternal grandfather; Liver disease in her paternal uncle; Mental illness in her father.She reports that she has been smoking e-cigarettes and cigarettes. She has never used smokeless tobacco. She reports current drug use. Drugs: Marijuana and Other-see comments. She reports that she does not drink alcohol.    ROS Review of Systems  Objective:  BP 107/74   Pulse (!) 105   Temp 97.7 F (36.5 C)   Ht 5' 6 (1.676 m)   Wt 190 lb (86.2 kg)   SpO2 100%   BMI 30.67 kg/m   BP Readings from Last 3 Encounters:  07/12/24 107/74  04/27/24 115/78  03/08/24 115/76    Wt Readings from Last 3 Encounters:  07/12/24 190 lb (86.2 kg)  04/27/24 194 lb (88 kg)  03/08/24 191 lb (86.6 kg)  Physical Exam Physical Exam GENERAL: Alert, cooperative, well developed, no acute distress. HEENT: Normocephalic, normal oropharynx, moist mucous membranes. CHEST: Slight to moderate wheeze and crackles. CARDIOVASCULAR: Normal heart rate and rhythm, S1 and S2 normal without murmurs. ABDOMEN: Soft, non-tender, non-distended, without organomegaly, normal bowel sounds. EXTREMITIES: No cyanosis or edema. NEUROLOGICAL: Cranial nerves grossly intact, moves all extremities without gross motor or sensory deficit.  Results for orders placed or performed in visit on  07/12/24  Veritor SARS-CoV-2 and Flu A+B   Collection Time: 07/12/24 12:23 PM   Specimen: Nasal Swab   Nasal Swab  Result Value Ref Range   Influenza A Positive (A) Negative   Influenza B Negative Negative   BD Veritor SARS-CoV-2 Ag Negative Negative  Rapid Strep Screen ([facility] ONLY)   Collection Time: 07/12/24 12:23 PM   Specimen: Other   Other  Result Value Ref Range   Strep Gp A Ag, IA W/Reflex Negative Negative  Culture, Group A Strep   Collection Time: 07/12/24 12:23 PM   Other  Result Value Ref Range   Strep A Culture CANCELED      Assessment & Plan:  Influenza with respiratory manifestation -     Oseltamivir  Phosphate; Take 1 capsule (75 mg total) by mouth 2 (two) times daily.  Dispense: 10 capsule; Refill: 0 -     Promethazine -DM; Take 5 mLs by mouth 4 (four) times daily as needed for cough.  Dispense: 240 mL; Refill: 0  Flu-like symptoms -     Veritor SARS-CoV-2 and Flu A+B -     Rapid Strep Screen ([facility] ONLY) -     Culture, Group A Strep    Assessment and Plan Assessment & Plan Influenza with respiratory manifestations   She presents with acute influenza, experiencing respiratory symptoms such as cough, chest tightness, and a slight to moderate wheeze. Symptoms began on Saturday, accompanied by fever and congestion. A strep test was negative, and results for flu and COVID swabs are pending. There are no signs of methamphetamine use. Weakness and difficulty breathing are noted, consistent with influenza. Prescribe Tamiflu  for treatment. Advise rest for a few days and encourage increased fluid intake, primarily water and Gatorade. Instruct her to discuss symptoms with her primary care provider for future reference.       Follow-up: Return if symptoms worsen or fail to improve.  Butler Der, M.D. "

## 2024-07-13 ENCOUNTER — Ambulatory Visit: Payer: Self-pay | Admitting: Family Medicine

## 2025-04-30 ENCOUNTER — Encounter: Admitting: Nurse Practitioner
